# Patient Record
Sex: Female | Born: 1950 | Race: White | Hispanic: No | Smoking: Former smoker
Health system: Southern US, Community
[De-identification: ages and names within clinical notes are randomized; demographics above are authoritative.]

## PROBLEM LIST (undated history)

## (undated) DIAGNOSIS — I10 Essential (primary) hypertension: Secondary | ICD-10-CM

## (undated) DIAGNOSIS — I639 Cerebral infarction, unspecified: Secondary | ICD-10-CM

## (undated) DIAGNOSIS — K219 Gastro-esophageal reflux disease without esophagitis: Secondary | ICD-10-CM

## (undated) DIAGNOSIS — Z87891 Personal history of nicotine dependence: Secondary | ICD-10-CM

## (undated) DIAGNOSIS — K635 Polyp of colon: Secondary | ICD-10-CM

## (undated) DIAGNOSIS — Z8679 Personal history of other diseases of the circulatory system: Secondary | ICD-10-CM

## (undated) DIAGNOSIS — R7302 Impaired glucose tolerance (oral): Secondary | ICD-10-CM

## (undated) DIAGNOSIS — Z86010 Personal history of colonic polyps: Secondary | ICD-10-CM

## (undated) DIAGNOSIS — R002 Palpitations: Secondary | ICD-10-CM

## (undated) DIAGNOSIS — Z0000 Encounter for general adult medical examination without abnormal findings: Secondary | ICD-10-CM

## (undated) DIAGNOSIS — Z85828 Personal history of other malignant neoplasm of skin: Secondary | ICD-10-CM

## (undated) DIAGNOSIS — E669 Obesity, unspecified: Secondary | ICD-10-CM

## (undated) DIAGNOSIS — N390 Urinary tract infection, site not specified: Secondary | ICD-10-CM

## (undated) DIAGNOSIS — F329 Major depressive disorder, single episode, unspecified: Secondary | ICD-10-CM

## (undated) DIAGNOSIS — F32A Depression, unspecified: Secondary | ICD-10-CM

## (undated) DIAGNOSIS — E785 Hyperlipidemia, unspecified: Secondary | ICD-10-CM

## (undated) DIAGNOSIS — G4730 Sleep apnea, unspecified: Secondary | ICD-10-CM

## (undated) DIAGNOSIS — D649 Anemia, unspecified: Secondary | ICD-10-CM

## (undated) DIAGNOSIS — H8110 Benign paroxysmal vertigo, unspecified ear: Secondary | ICD-10-CM

## (undated) DIAGNOSIS — I499 Cardiac arrhythmia, unspecified: Secondary | ICD-10-CM

## (undated) DIAGNOSIS — K922 Gastrointestinal hemorrhage, unspecified: Secondary | ICD-10-CM

## (undated) DIAGNOSIS — M1990 Unspecified osteoarthritis, unspecified site: Secondary | ICD-10-CM

## (undated) DIAGNOSIS — K5790 Diverticulosis of intestine, part unspecified, without perforation or abscess without bleeding: Secondary | ICD-10-CM

## (undated) HISTORY — DX: Impaired glucose tolerance (oral): R73.02

## (undated) HISTORY — PX: CAROTID STENT: SHX1301

## (undated) HISTORY — DX: Personal history of other malignant neoplasm of skin: Z85.828

## (undated) HISTORY — DX: Personal history of colonic polyps: Z86.010

## (undated) HISTORY — DX: Polyp of colon: K63.5

## (undated) HISTORY — DX: Gastro-esophageal reflux disease without esophagitis: K21.9

## (undated) HISTORY — DX: Encounter for general adult medical examination without abnormal findings: Z00.00

## (undated) HISTORY — DX: Cerebral infarction, unspecified: I63.9

## (undated) HISTORY — PX: WISDOM TOOTH EXTRACTION: SHX21

## (undated) HISTORY — DX: Personal history of nicotine dependence: Z87.891

## (undated) HISTORY — DX: Major depressive disorder, single episode, unspecified: F32.9

## (undated) HISTORY — DX: Personal history of other diseases of the circulatory system: Z86.79

## (undated) HISTORY — PX: CERVICAL CONE BIOPSY: SUR198

## (undated) HISTORY — DX: Urinary tract infection, site not specified: N39.0

## (undated) HISTORY — DX: Unspecified osteoarthritis, unspecified site: M19.90

## (undated) HISTORY — DX: Diverticulosis of intestine, part unspecified, without perforation or abscess without bleeding: K57.90

## (undated) HISTORY — DX: Essential (primary) hypertension: I10

## (undated) HISTORY — DX: Palpitations: R00.2

## (undated) HISTORY — DX: Sleep apnea, unspecified: G47.30

## (undated) HISTORY — DX: Hyperlipidemia, unspecified: E78.5

## (undated) HISTORY — DX: Gastrointestinal hemorrhage, unspecified: K92.2

## (undated) HISTORY — PX: JOINT REPLACEMENT: SHX530

## (undated) HISTORY — DX: Depression, unspecified: F32.A

## (undated) HISTORY — PX: TUBAL LIGATION: SHX77

## (undated) HISTORY — DX: Benign paroxysmal vertigo, unspecified ear: H81.10

## (undated) HISTORY — DX: Obesity, unspecified: E66.9

---
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
## 1998-01-11 ENCOUNTER — Other Ambulatory Visit: Admission: RE | Admit: 1998-01-11 | Discharge: 1998-01-11 | Payer: Self-pay | Admitting: *Deleted

## 1999-04-11 ENCOUNTER — Other Ambulatory Visit: Admission: RE | Admit: 1999-04-11 | Discharge: 1999-04-11 | Payer: Self-pay | Admitting: *Deleted

## 2000-05-07 ENCOUNTER — Other Ambulatory Visit: Admission: RE | Admit: 2000-05-07 | Discharge: 2000-05-07 | Payer: Self-pay | Admitting: *Deleted

## 2001-08-05 ENCOUNTER — Other Ambulatory Visit: Admission: RE | Admit: 2001-08-05 | Discharge: 2001-08-05 | Payer: Self-pay | Admitting: *Deleted

## 2001-08-29 ENCOUNTER — Encounter: Admission: RE | Admit: 2001-08-29 | Discharge: 2001-08-29 | Payer: Self-pay | Admitting: Internal Medicine

## 2001-08-29 ENCOUNTER — Encounter: Payer: Self-pay | Admitting: Internal Medicine

## 2002-07-28 ENCOUNTER — Other Ambulatory Visit: Admission: RE | Admit: 2002-07-28 | Discharge: 2002-07-28 | Payer: Self-pay | Admitting: *Deleted

## 2002-10-10 ENCOUNTER — Ambulatory Visit (HOSPITAL_COMMUNITY): Admission: RE | Admit: 2002-10-10 | Discharge: 2002-10-10 | Payer: Self-pay | Admitting: Internal Medicine

## 2002-10-10 ENCOUNTER — Encounter: Payer: Self-pay | Admitting: Internal Medicine

## 2002-10-13 ENCOUNTER — Encounter: Payer: Self-pay | Admitting: Internal Medicine

## 2002-10-13 ENCOUNTER — Ambulatory Visit (HOSPITAL_COMMUNITY): Admission: RE | Admit: 2002-10-13 | Discharge: 2002-10-13 | Payer: Self-pay | Admitting: Internal Medicine

## 2002-10-24 ENCOUNTER — Encounter: Admission: RE | Admit: 2002-10-24 | Discharge: 2002-10-24 | Payer: Self-pay | Admitting: Infectious Diseases

## 2002-10-30 ENCOUNTER — Encounter: Admission: RE | Admit: 2002-10-30 | Discharge: 2002-10-30 | Payer: Self-pay | Admitting: Infectious Diseases

## 2002-12-13 ENCOUNTER — Encounter: Admission: RE | Admit: 2002-12-13 | Discharge: 2002-12-13 | Payer: Self-pay | Admitting: Infectious Diseases

## 2002-12-29 ENCOUNTER — Encounter: Admission: RE | Admit: 2002-12-29 | Discharge: 2002-12-29 | Payer: Self-pay | Admitting: Infectious Diseases

## 2002-12-29 ENCOUNTER — Encounter: Payer: Self-pay | Admitting: Infectious Diseases

## 2003-01-23 ENCOUNTER — Encounter: Admission: RE | Admit: 2003-01-23 | Discharge: 2003-01-23 | Payer: Self-pay | Admitting: Infectious Diseases

## 2003-08-10 ENCOUNTER — Other Ambulatory Visit: Admission: RE | Admit: 2003-08-10 | Discharge: 2003-08-10 | Payer: Self-pay | Admitting: *Deleted

## 2003-10-19 ENCOUNTER — Encounter: Admission: RE | Admit: 2003-10-19 | Discharge: 2003-10-19 | Payer: Self-pay | Admitting: Family Medicine

## 2007-03-15 ENCOUNTER — Ambulatory Visit: Payer: Self-pay | Admitting: Internal Medicine

## 2007-03-15 ENCOUNTER — Ambulatory Visit: Payer: Self-pay | Admitting: Cardiology

## 2007-03-15 ENCOUNTER — Observation Stay (HOSPITAL_COMMUNITY): Admission: EM | Admit: 2007-03-15 | Discharge: 2007-03-17 | Payer: Self-pay | Admitting: Emergency Medicine

## 2007-03-16 ENCOUNTER — Encounter (INDEPENDENT_AMBULATORY_CARE_PROVIDER_SITE_OTHER): Payer: Self-pay | Admitting: Internal Medicine

## 2007-12-20 ENCOUNTER — Ambulatory Visit (HOSPITAL_BASED_OUTPATIENT_CLINIC_OR_DEPARTMENT_OTHER): Admission: RE | Admit: 2007-12-20 | Discharge: 2007-12-20 | Payer: Self-pay | Admitting: *Deleted

## 2007-12-20 ENCOUNTER — Ambulatory Visit: Payer: Self-pay | Admitting: *Deleted

## 2007-12-20 DIAGNOSIS — Z85828 Personal history of other malignant neoplasm of skin: Secondary | ICD-10-CM | POA: Insufficient documentation

## 2007-12-20 DIAGNOSIS — Z87898 Personal history of other specified conditions: Secondary | ICD-10-CM | POA: Insufficient documentation

## 2007-12-20 DIAGNOSIS — M129 Arthropathy, unspecified: Secondary | ICD-10-CM | POA: Insufficient documentation

## 2007-12-20 DIAGNOSIS — IMO0002 Reserved for concepts with insufficient information to code with codable children: Secondary | ICD-10-CM | POA: Insufficient documentation

## 2007-12-20 DIAGNOSIS — Z8679 Personal history of other diseases of the circulatory system: Secondary | ICD-10-CM | POA: Insufficient documentation

## 2007-12-20 DIAGNOSIS — I10 Essential (primary) hypertension: Secondary | ICD-10-CM | POA: Insufficient documentation

## 2007-12-20 DIAGNOSIS — E785 Hyperlipidemia, unspecified: Secondary | ICD-10-CM | POA: Insufficient documentation

## 2007-12-20 LAB — CONVERTED CEMR LAB
ALT: 22 units/L (ref 0–35)
AST: 23 units/L (ref 0–37)
Basophils Absolute: 0 10*3/uL (ref 0.0–0.1)
Basophils Relative: 0.8 % (ref 0.0–1.0)
Calcium: 10.2 mg/dL (ref 8.4–10.5)
Chloride: 104 meq/L (ref 96–112)
Cholesterol: 224 mg/dL (ref 0–200)
Creatinine, Ser: 0.8 mg/dL (ref 0.4–1.2)
Eosinophils Absolute: 0.1 10*3/uL (ref 0.0–0.7)
HDL: 47 mg/dL (ref >39.0)
Lymphocytes Relative: 40.4 % (ref 12.0–46.0)
MCHC: 34.3 g/dL (ref 30.0–36.0)
MCV: 92 fL (ref 78.0–100.0)
Neutrophils Relative %: 49 % (ref 43.0–77.0)
RBC: 4.56 M/uL (ref 3.87–5.11)
RDW: 12.4 % (ref 11.5–14.6)
Sodium: 143 meq/L (ref 135–145)
TSH: 1.3 microintl units/mL (ref 0.35–5.50)
Total Bilirubin: 0.9 mg/dL (ref 0.3–1.2)
Total Protein: 7.5 g/dL (ref 6.0–8.3)
Triglycerides: 161 mg/dL — ABNORMAL HIGH (ref 0–149)
VLDL: 32 mg/dL (ref 0–40)

## 2007-12-22 ENCOUNTER — Encounter (INDEPENDENT_AMBULATORY_CARE_PROVIDER_SITE_OTHER): Payer: Self-pay | Admitting: *Deleted

## 2007-12-22 DIAGNOSIS — E739 Lactose intolerance, unspecified: Secondary | ICD-10-CM | POA: Insufficient documentation

## 2008-02-02 ENCOUNTER — Ambulatory Visit: Payer: Self-pay | Admitting: *Deleted

## 2008-02-03 LAB — CONVERTED CEMR LAB
ALT: 21 units/L (ref 0–35)
Albumin: 4 g/dL (ref 3.5–5.2)
Basophils Absolute: 0 10*3/uL (ref 0.0–0.1)
CO2: 30 meq/L (ref 19–32)
Calcium: 9.5 mg/dL (ref 8.4–10.5)
Chloride: 105 meq/L (ref 96–112)
Cholesterol: 230 mg/dL (ref 0–200)
GFR calc Af Amer: 110 mL/min
HDL: 51.7 mg/dL (ref >39.0)
Lymphocytes Relative: 38.2 % (ref 12.0–46.0)
MCHC: 35.1 g/dL (ref 30.0–36.0)
Neutrophils Relative %: 51.7 % (ref 43.0–77.0)
Potassium: 4 meq/L (ref 3.5–5.1)
RDW: 12.5 % (ref 11.5–14.6)
Sodium: 141 meq/L (ref 135–145)
TSH: 1.49 microintl units/mL (ref 0.35–5.50)
Total Bilirubin: 0.8 mg/dL (ref 0.3–1.2)
Total CHOL/HDL Ratio: 4.4
Total Protein: 7 g/dL (ref 6.0–8.3)
VLDL: 28 mg/dL (ref 0–40)

## 2008-02-17 ENCOUNTER — Ambulatory Visit: Payer: Self-pay | Admitting: *Deleted

## 2008-03-13 ENCOUNTER — Ambulatory Visit: Payer: Self-pay | Admitting: *Deleted

## 2008-04-21 ENCOUNTER — Emergency Department (HOSPITAL_BASED_OUTPATIENT_CLINIC_OR_DEPARTMENT_OTHER): Admission: EM | Admit: 2008-04-21 | Discharge: 2008-04-21 | Payer: Self-pay | Admitting: Emergency Medicine

## 2008-07-17 ENCOUNTER — Ambulatory Visit: Payer: Self-pay | Admitting: *Deleted

## 2008-07-17 LAB — CONVERTED CEMR LAB
ALT: 22 units/L (ref 0–35)
CO2: 30 meq/L (ref 19–32)
Calcium: 9.7 mg/dL (ref 8.4–10.5)
Chloride: 100 meq/L (ref 96–112)
Creatinine, Ser: 0.8 mg/dL (ref 0.4–1.2)
GFR calc Af Amer: 94 mL/min

## 2008-07-18 DIAGNOSIS — F329 Major depressive disorder, single episode, unspecified: Secondary | ICD-10-CM | POA: Insufficient documentation

## 2008-10-10 ENCOUNTER — Ambulatory Visit: Payer: Self-pay | Admitting: Internal Medicine

## 2008-10-10 LAB — CONVERTED CEMR LAB
AST: 25 units/L (ref 0–37)
CRP, High Sensitivity: 8 — ABNORMAL HIGH (ref 0.00–5.00)
HDL: 51.6 mg/dL (ref >39.00)
LDL Goal: 130 mg/dL
TSH: 1.35 microintl units/mL (ref 0.35–5.50)
Triglycerides: 125 mg/dL (ref 0.0–149.0)

## 2008-10-16 ENCOUNTER — Telehealth: Payer: Self-pay | Admitting: Internal Medicine

## 2008-11-01 ENCOUNTER — Inpatient Hospital Stay (HOSPITAL_COMMUNITY): Admission: RE | Admit: 2008-11-01 | Discharge: 2008-11-05 | Payer: Self-pay | Admitting: Orthopedic Surgery

## 2008-11-01 HISTORY — PX: KNEE SURGERY: SHX244

## 2008-11-29 ENCOUNTER — Ambulatory Visit: Payer: Self-pay | Admitting: Internal Medicine

## 2009-01-22 ENCOUNTER — Telehealth: Payer: Self-pay | Admitting: Internal Medicine

## 2009-02-05 ENCOUNTER — Encounter: Payer: Self-pay | Admitting: Family Medicine

## 2009-02-05 ENCOUNTER — Ambulatory Visit: Payer: Self-pay | Admitting: Family Medicine

## 2009-02-05 ENCOUNTER — Other Ambulatory Visit: Admission: RE | Admit: 2009-02-05 | Discharge: 2009-02-05 | Payer: Self-pay | Admitting: *Deleted

## 2009-02-05 ENCOUNTER — Ambulatory Visit (HOSPITAL_BASED_OUTPATIENT_CLINIC_OR_DEPARTMENT_OTHER): Admission: RE | Admit: 2009-02-05 | Discharge: 2009-02-05 | Payer: Self-pay | Admitting: Internal Medicine

## 2009-02-05 DIAGNOSIS — Z780 Asymptomatic menopausal state: Secondary | ICD-10-CM | POA: Insufficient documentation

## 2009-04-16 ENCOUNTER — Telehealth: Payer: Self-pay | Admitting: Internal Medicine

## 2009-04-23 ENCOUNTER — Telehealth: Payer: Self-pay | Admitting: Internal Medicine

## 2009-09-13 ENCOUNTER — Telehealth: Payer: Self-pay | Admitting: Internal Medicine

## 2009-09-19 ENCOUNTER — Ambulatory Visit: Payer: Self-pay | Admitting: Internal Medicine

## 2009-09-19 LAB — CONVERTED CEMR LAB
BUN: 21 mg/dL (ref 6–23)
CO2: 23 meq/L (ref 19–32)
Chloride: 101 meq/L (ref 96–112)
Creatinine, Ser: 0.8 mg/dL (ref 0.40–1.20)
Glucose, Bld: 104 mg/dL — ABNORMAL HIGH (ref 70–99)
LDL Cholesterol: 119 mg/dL — ABNORMAL HIGH (ref 0–99)

## 2009-09-20 ENCOUNTER — Encounter: Payer: Self-pay | Admitting: Internal Medicine

## 2010-06-27 ENCOUNTER — Ambulatory Visit
Admission: RE | Admit: 2010-06-27 | Discharge: 2010-06-27 | Payer: Self-pay | Source: Home / Self Care | Attending: Internal Medicine | Admitting: Internal Medicine

## 2010-06-27 DIAGNOSIS — J310 Chronic rhinitis: Secondary | ICD-10-CM | POA: Insufficient documentation

## 2010-07-10 NOTE — Assessment & Plan Note (Signed)
Summary: fu meds self pay does not have ins/dt   Vital Signs:  Patient profile:   60 year old female Menstrual status:  postmenopausal Height:      60 inches Weight:      187 pounds BMI:     36.65 O2 Sat:      95 % on 0.75 L/min Temp:     98.0 degrees F oral Pulse rate:   63 / minute Pulse rhythm:   regular Resp:     16 per minute BP sitting:   110 / 80  (left arm) Cuff size:   large  Vitals Entered By: Glendell Docker CMA (September 19, 2009 11:14 AM)  O2 Flow:  0.75 L/min CC: Rm 2- [place] Refill Comments fasting for labs, requestin 90 with refills- paper rx to take with her   Primary Care Provider:  Dondra Spry DO  CC:  Rm 2- Meidcation Refill.  History of Present Illness:  Hypertension Follow-Up      This is a 60 year old woman who presents for Hypertension follow-up.  The patient denies lightheadedness and headaches.  The patient denies the following associated symptoms: chest pain.  Compliance with medications (by patient report) has been near 100%.  The patient reports that dietary compliance has been fair.    hyperlipidemia - stable  Allergies: 1)  ! 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   Neck:  no masses.  no carotid bruits.   Lungs:  normal respiratory effort, normal breath sounds, no crackles, and no wheezes.   Heart:  normal rate, regular rhythm, and no gallop.   Extremities:  No lower extremity  edema    Impression & Recommendations:  Problem # 1:  ESSENTIAL HYPERTENSION, BENIGN (ICD-401.1) stable.  Maintain current medication regimen.  Her updated medication list for this problem includes:    Hydrochlorothiazide 12.5 Mg Caps (Hydrochlorothiazide) .Marland Kitchen... Take 1 capsule by mouth once a day    Metoprolol Tartrate 50 Mg Tabs (Metoprolol tartrate) .Marland Kitchen... 1 tab by mouth bid  Orders: T-Basic Metabolic Panel [phone])  BP today: 110/80 Prior BP: 124/82 (02/05/2009)  Prior 10 Yr Risk Heart Disease: Not enough information (10/10/2008)  Labs Reviewed: K+: 3.8 (07/17/2008) Creat: : 0.8 (07/17/2008)   Chol: 204 (10/10/2008)   HDL: 51.60 (10/10/2008)   LDL: DEL (02/02/2008)   TG: 125.0 (10/10/2008)  Problem # 2:  HYPERLIPIDEMIA (ICD-272.4) stable.  monitor labs  Her updated medication list for this problem includes:    Pravastatin Sodium 40 Mg Tabs (Pravastatin sodium) ..... One by mouth qhs  Orders: T-Lipid Profile [id number])  Labs Reviewed: SGOT: 25 (10/10/2008)   SGPT: 25 (10/10/2008)  Lipid Goals: Chol Goal: 200 (10/10/2008)   HDL Goal: 40 (10/10/2008)   LDL Goal: 130 (10/10/2008)   TG Goal: 150 (10/10/2008)  Prior 10 Yr Risk Heart Disease: Not enough information (10/10/2008)   HDL:51.60 (10/10/2008), 51.7 (02/02/2008)  LDL:DEL (02/02/2008), DEL (12/20/2007)  Chol:204 (10/10/2008), 230 (02/02/2008)  Trig:125.0 (10/10/2008), 139 (02/02/2008)  Complete Medication List: 1)  Bayer Low Strength 81 Mg Tbec (Aspirin) .Marland KitchenMarland KitchenMarland Kitchen  Once daily 2)  Hydrochlorothiazide 12.5 Mg Caps (Hydrochlorothiazide) .... Take 1 capsule by mouth once a day 3)  Fluoxetine Hcl 20 Mg Caps (Fluoxetine hcl) .... Take 1 tablet by mouth once a day 4)  Metoprolol Tartrate 50 Mg Tabs (Metoprolol tartrate) .Marland Kitchen.. 1 tab by mouth bid 5)  Multi For Her 50+ Tabs (Multiple vitamins-minerals) .... Take 1 tablet by mouth once a day 6)  Pravastatin Sodium 40 Mg Tabs (Pravastatin sodium) .... One by mouth  qhs  Patient Instructions: 1)  [email or web address]-[email or web address]/ 2)  Please schedule a follow-up appointment in 6 months. Prescriptions: METOPROLOL TARTRATE 50 MG TABS (METOPROLOL TARTRATE) 1 tab by mouth bid  #180 x 1   Entered and Authorized by:   D. Thomos Lemons DO   Signed by:   D. Thomos Lemons DO on 09/19/2009   Method used:   Electronically to        [facility] # [phone]* (retail)       [address]       [place], [place]  [address]       Ph: [phone]       Fax: [phone]   RxID:   [id number] HYDROCHLOROTHIAZIDE 12.5 MG CAPS (HYDROCHLOROTHIAZIDE) Take 1 capsule by mouth once a day  #90 x 1   Entered and Authorized by:   D. Thomos Lemons DO   Signed by:   D. Thomos Lemons DO on 09/19/2009   Method used:   Electronically to        [facility] # [phone]* (retail)       [address]       [place], [place]  [address]       Ph: [phone]       Fax: [phone]   RxID:   [id number] FLUOXETINE HCL 20 MG  CAPS (FLUOXETINE HCL) Take 1 tablet by mouth once a day  #90 x 1   Entered and Authorized by:   D. Thomos Lemons DO   Signed by:   D. Thomos Lemons DO on 09/19/2009   Method used:   Electronically to        [facility] # [phone]* (retail)       [address]       [place], [place]  [address]       Ph: [phone]       Fax: [phone]   RxID:   [id number] PRAVASTATIN SODIUM 40 MG TABS (PRAVASTATIN SODIUM) one by mouth qhs  #90 x 3   Entered and Authorized by:   D. Thomos Lemons DO   Signed by:   D. Thomos Lemons DO on 09/19/2009   Method used:   Electronically to        [facility] # [phone]* (retail)       [address]       [place], [place]  [address]       Ph: [phone]       Fax: [phone]   RxID:   [id number]   Current Allergies (reviewed today): ! VICODIN (HYDROCODONE-ACETAMINOPHEN)     Immunization History:  Pneumovax Immunization  History:    Pneumovax:  historical (10/16/2008)

## 2010-07-10 NOTE — Letter (Signed)
   [facility] [address] [place], [place]  [address]  [place] Phone: [phone]      September 20, 2009   [employer] [facility] [address] [place], [place] [address]  RE:  LAB RESULTS  Dear  Ms. Belsky,  The following is an interpretation of your most recent lab tests.  Please take note of any instructions provided or changes to medications that have resulted from your lab work.  ELECTROLYTES:  Good - no changes needed  KIDNEY FUNCTION TESTS:  Good - no changes needed  LIPID PANEL:  Fair - review at your next visit Triglyceride: 215   Cholesterol: 215   LDL: 119   HDL: 53   Chol/HDL%:  4.1 Ratio    Please follow a low saturated fat diet and take your cholesterol medication regularly.       Sincerely Yours,    Dr. Thomos Lemons

## 2010-07-10 NOTE — Progress Notes (Signed)
Summary: Medication Refills  Phone Note Refill Request Call back at Home Phone [phone]   Refills Requested: Medication #1:  HYDROCHLOROTHIAZIDE 12.5 MG CAPS Take 1 capsule by mouth once a day   Dosage confirmed as above?Dosage Confirmed   Brand Name Necessary? No   Supply Requested: 3 months  Medication #2:  FLUOXETINE HCL 20 MG  CAPS Take 1 tablet by mouth once a day   Dosage confirmed as above?Dosage Confirmed   Brand Name Necessary? No   Supply Requested: 3 months  Medication #3:  METOPROLOL TARTRATE 50 MG TABS 1 tab by mouth bid   Dosage confirmed as above?Dosage Confirmed   Brand Name Necessary? No   Supply Requested: 3 months  Medication #4:  LOVASTATIN 20 MG TABS 2 tabs by mouth once daily.   Dosage confirmed as above?Dosage Confirmed   Brand Name Necessary? No   Supply Requested: 3 months the drug store only gave her a 15 days upply for metoprolol.  if she gets a 90 day rx on each each time it only costs 10 per rx if tshe gets a 30 day its $4 She saves $2 by getting 90 days.  she no longer has insurance.  [facility] [place]     Method Requested: Electronic Next Appointment Scheduled: none Initial call taken by: Roselle Locus,  September 13, 2009 11:48 AM  Follow-up for Phone Call        call  returned to patient at [phone], she was informed she is due an office visit,refills could not be authorized for a 90 day supply with refills, unless approved by Dr Artist Pais. She states she does not have any insurance right now, however if needed she would find a wat to get in here. Patient was made aware that Dr Artist Pais was out of the office until Monday, and she would get a cll back then regarding the status. Patient verbalized understanding and she is okay to wait until then Follow-up by: Glendell Docker CMA,  September 13, 2009 2:53 PM

## 2010-07-16 NOTE — Assessment & Plan Note (Signed)
Summary: Sinus infection/hea   Vital Signs:  Patient profile:   60 year old female Menstrual status:  postmenopausal Height:      60 inches Weight:      186.25 pounds BMI:     36.51 O2 Sat:      94 % on Room air Temp:     97.5 degrees F oral Pulse rate:   84 / minute Resp:     22 per minute BP sitting:   112 / 82  (right arm) Cuff size:   large  Vitals Entered By: Glendell Docker CMA (June 27, 2010 8:04 AM)  O2 Flow:  Room air CC: Nasal Drip Is Patient Diabetic? No Pain Assessment Patient in pain? no        Primary Care Provider:  Dondra Spry DO  CC:  Nasal Drip.  History of Present Illness: post nasal gtt x 1 month keeps her up at night throat feels tight tried netti pot tried raising head of bed  hall cough drops   no facial sensation  has been using afrin on and off for years  Preventive Screening-Counseling & Management  Alcohol-Tobacco     Smoking Status: quit  Allergies: 1)  ! Vicodin (Hydrocodone-Acetaminophen)  Past History:  Past Medical History: CARCINOMA, BASAL CELL, HX OF (ICD-V10.83) ARRHYTHMIA, HX OF (ICD-V12.50) ARTHRITIS (ICD-716.90)  GLAUCOMA, HX OF (ICD-V13.8) HYPERLIPIDEMIA (ICD-272.4)  ESSENTIAL HYPERTENSION, BENIGN (ICD-401.1) glucose intolerance   Depression  Physical Exam  General:  alert, well-developed, and well-nourished.   Ears:  R ear normal and L ear normal.   Nose:  mucosal erythema, mucosal edema, and airflow obstruction.   Mouth:  pharynx pink and moist.  mild post nasal gtt   Impression & Recommendations:  Problem # 1:  RHINITIS (ICD-472.0) stop afrin use only astelin and flonase as directed Patient advised to call office if symptoms persist or worsen.  Problem # 2:  ESSENTIAL HYPERTENSION, BENIGN (ICD-401.1) Assessment: Unchanged  Her updated medication list for this problem includes:    Hydrochlorothiazide 12.5 Mg Caps (Hydrochlorothiazide) .Marland Kitchen... Take 1 capsule by mouth once a day  Metoprolol Tartrate 50 Mg Tabs (Metoprolol tartrate) .Marland Kitchen... 1 tab by mouth bid  BP today: 112/82 Prior BP: 110/80 (09/19/2009)  Prior 10 Yr Risk Heart Disease: Not enough information (10/10/2008)  Labs Reviewed: K+: 4.3 (09/19/2009) Creat: : 0.80 (09/19/2009)   Chol: 215 (09/19/2009)   HDL: 53 (09/19/2009)   LDL: 119 (09/19/2009)   TG: 215 (09/19/2009)  Problem # 3:  HYPERLIPIDEMIA (ICD-272.4)  Her updated medication list for this problem includes:    Pravastatin Sodium 40 Mg Tabs (Pravastatin sodium) ..... One by mouth qhs  Labs Reviewed: SGOT: 25 (10/10/2008)   SGPT: 25 (10/10/2008)  Lipid Goals: Chol Goal: 200 (10/10/2008)   HDL Goal: 40 (10/10/2008)   LDL Goal: 130 (10/10/2008)   TG Goal: 150 (10/10/2008)  Prior 10 Yr Risk Heart Disease: Not enough information (10/10/2008)   HDL:53 (09/19/2009), 51.60 (10/10/2008)  LDL:119 (09/19/2009), DEL (02/02/2008)  Chol:215 (09/19/2009), 204 (10/10/2008)  Trig:215 (09/19/2009), 125.0 (10/10/2008)  Problem # 4:  DEPRESSION (ICD-311) Assessment: Unchanged  Her updated medication list for this problem includes:    Fluoxetine Hcl 20 Mg Caps (Fluoxetine hcl) .Marland Kitchen... Take 1 tablet by mouth once a day  Complete Medication List: 1)  Bayer Low Strength 81 Mg Tbec (Aspirin) .... Once daily 2)  Hydrochlorothiazide 12.5 Mg Caps (Hydrochlorothiazide) .... Take 1 capsule by mouth once a day 3)  Fluoxetine Hcl 20 Mg Caps (Fluoxetine hcl) .Marland KitchenMarland KitchenMarland Kitchen  Take 1 tablet by mouth once a day 4)  Metoprolol Tartrate 50 Mg Tabs (Metoprolol tartrate) .Marland Kitchen.. 1 tab by mouth bid 5)  Multi For Her 50+ Tabs (Multiple vitamins-minerals) .... Take 1 tablet by mouth once a day 6)  Pravastatin Sodium 40 Mg Tabs (Pravastatin sodium) .... One by mouth qhs 7)  Fluticasone Propionate 50 Mcg/act Susp (Fluticasone propionate) .... 2 sprays each nostril once daily 8)  Azelastine Hcl 137 Mcg/spray Soln (Azelastine hcl) .... 2 sprays each nostril two times a day  Patient Instructions: 1)   Call our office if your symptoms do not  improve or gets worse. 2)  Please schedule a follow-up appointment in 6 months. 3)  Stop using afrin Prescriptions: PRAVASTATIN SODIUM 40 MG TABS (PRAVASTATIN SODIUM) one by mouth qhs  #90 x 1   Entered and Authorized by:   D. Thomos Lemons DO   Signed by:   D. Thomos Lemons DO on 06/27/2010   Method used:   Print then Give to Patient   RxID:   [id number] METOPROLOL TARTRATE 50 MG TABS (METOPROLOL TARTRATE) 1 tab by mouth bid  #180 x 1   Entered and Authorized by:   D. Thomos Lemons DO   Signed by:   D. Thomos Lemons DO on 06/27/2010   Method used:   Print then Give to Patient   RxID:   [id number] FLUOXETINE HCL 20 MG  CAPS (FLUOXETINE HCL) Take 1 tablet by mouth once a day  #90 x 1   Entered and Authorized by:   D. Thomos Lemons DO   Signed by:   D. Thomos Lemons DO on 06/27/2010   Method used:   Print then Give to Patient   RxID:   [id number] HYDROCHLOROTHIAZIDE 12.5 MG CAPS (HYDROCHLOROTHIAZIDE) Take 1 capsule by mouth once a day  #90 Each x 1   Entered and Authorized by:   D. Thomos Lemons DO   Signed by:   D. Thomos Lemons DO on 06/27/2010   Method used:   Print then Give to Patient   RxID:   [id number] AZELASTINE HCL 137 MCG/SPRAY SOLN (AZELASTINE HCL) 2 sprays each nostril two times a day  #1 x 2   Entered and Authorized by:   D. Thomos Lemons DO   Signed by:   D. Thomos Lemons DO on 06/27/2010   Method used:   Print then Give to Patient   RxID:   [id number] FLUTICASONE PROPIONATE 50 MCG/ACT SUSP (FLUTICASONE PROPIONATE) 2 sprays each nostril once daily  #1 x 2   Entered and Authorized by:   D. Thomos Lemons DO   Signed by:   D. Thomos Lemons DO on 06/27/2010   Method used:   Print then Give to Patient   RxID:   [id number]    Orders Added: 1)  Est. Patient Level II [29937]       Current Allergies (reviewed today): ! VICODIN (HYDROCODONE-ACETAMINOPHEN)

## 2010-09-16 LAB — COMPREHENSIVE METABOLIC PANEL
ALT: 20 U/L (ref 0–35)
Albumin: 4.3 g/dL (ref 3.5–5.2)
Alkaline Phosphatase: 65 U/L (ref 39–117)
BUN: 15 mg/dL (ref 6–23)
Chloride: 101 mEq/L (ref 96–112)
Glucose, Bld: 104 mg/dL — ABNORMAL HIGH (ref 70–99)
Potassium: 4.1 mEq/L (ref 3.5–5.1)
Total Bilirubin: 0.3 mg/dL (ref 0.3–1.2)

## 2010-09-16 LAB — BASIC METABOLIC PANEL
BUN: 10 mg/dL (ref 6–23)
BUN: 11 mg/dL (ref 6–23)
BUN: 8 mg/dL (ref 6–23)
CO2: 30 mEq/L (ref 19–32)
CO2: 30 mEq/L (ref 19–32)
CO2: 32 mEq/L (ref 19–32)
Calcium: 8.7 mg/dL (ref 8.4–10.5)
Calcium: 9 mg/dL (ref 8.4–10.5)
Calcium: 9 mg/dL (ref 8.4–10.5)
Creatinine, Ser: 0.72 mg/dL (ref 0.4–1.2)
Creatinine, Ser: 0.73 mg/dL (ref 0.4–1.2)
Creatinine, Ser: 0.78 mg/dL (ref 0.4–1.2)
GFR calc Af Amer: >60 mL/min (ref >60)
GFR calc Af Amer: >60 mL/min (ref >60)
GFR calc non Af Amer: >60 mL/min (ref >60)
GFR calc non Af Amer: >60 mL/min (ref >60)
GFR calc non Af Amer: >60 mL/min (ref >60)
Glucose, Bld: 121 mg/dL — ABNORMAL HIGH (ref 70–99)
Glucose, Bld: 136 mg/dL — ABNORMAL HIGH (ref 70–99)
Glucose, Bld: 157 mg/dL — ABNORMAL HIGH (ref 70–99)
Potassium: 3.3 mEq/L — ABNORMAL LOW (ref 3.5–5.1)
Sodium: 138 mEq/L (ref 135–145)

## 2010-09-16 LAB — CBC
HCT: 29.2 % — ABNORMAL LOW (ref 36.0–46.0)
HCT: 39.5 % (ref 36.0–46.0)
Hemoglobin: 14 g/dL (ref 12.0–15.0)
MCHC: 34.2 g/dL (ref 30.0–36.0)
MCHC: 34.9 g/dL (ref 30.0–36.0)
MCV: 94.1 fL (ref 78.0–100.0)
Platelets: 153 10*3/uL (ref 150–400)
Platelets: 172 10*3/uL (ref 150–400)
Platelets: 187 10*3/uL (ref 150–400)
RBC: 2.86 MIL/uL — ABNORMAL LOW (ref 3.87–5.11)
RBC: 4.32 MIL/uL (ref 3.87–5.11)
RDW: 13 % (ref 11.5–15.5)
RDW: 13 % (ref 11.5–15.5)
RDW: 13.1 % (ref 11.5–15.5)
WBC: 6.9 10*3/uL (ref 4.0–10.5)
WBC: 9.4 10*3/uL (ref 4.0–10.5)

## 2010-09-16 LAB — PROTIME-INR
INR: 1 (ref 0.00–1.49)
INR: 1.8 — ABNORMAL HIGH (ref 0.00–1.49)
INR: 2.1 — ABNORMAL HIGH (ref 0.00–1.49)
Prothrombin Time: 13.7 seconds (ref 11.6–15.2)
Prothrombin Time: 21.6 seconds — ABNORMAL HIGH (ref 11.6–15.2)
Prothrombin Time: 23.3 seconds — ABNORMAL HIGH (ref 11.6–15.2)

## 2010-10-21 NOTE — Op Note (Signed)
NAMEKYNNEDY, CARRENO                ACCOUNT NO.:  [id number]   MEDICAL RECORD NO.:  [id number]          PATIENT TYPE:  INP   LOCATION:  5039                         FACILITY:  [facility]   PHYSICIAN:  Nadara Mustard, MD     DATE OF BIRTH:  12-14-1950   DATE OF PROCEDURE:  11/01/2008  DATE OF DISCHARGE:                               OPERATIVE REPORT   PREOPERATIVE DIAGNOSIS:  Osteoarthritis, left knee.   POSTOPERATIVE DIAGNOSIS:  Osteoarthritis, left knee.   PROCEDURE:  Left total knee arthroplasty with Katrinka Blazing and nephew  components, a #4 femur, #3 tibia, 13-mm posterior stabilized  polyethylene with a 32-mm patella.   SURGEON:  Nadara Mustard, MD   ANESTHESIA:  Wende Neighbors, PA-C   ANESTHESIA:  General plus femoral block.   ESTIMATED BLOOD LOSS:  Minimal.   ANTIBIOTICS:  Kefzol 2 g.   DRAINS:  None.   COMPLICATIONS:  None.   TOURNIQUET TIME:  44 minutes at 300 mmHg in the thigh.   DISPOSITION:  To PACU in stable condition.   INDICATIONS FOR PROCEDURE:  The patient is a 60 year old woman with  osteoarthritis of her left knee.  She has pain with activities of daily  living.  She has failed conservative care and presents at this time for  total knee arthroplasty.  Risks and benefits were discussed including  infection of neurovascular injury, persistent pain, DVT, pulmonary  embolus, and need for additional surgery.  The patient states she  understands and wished to proceed at this time.   DESCRIPTION OF PROCEDURE:  The patient was brought to OR room 15 after  undergoing a knee block.  She then underwent general anesthetic.  After  adequate level of anesthesia obtained, the patient's left lower  extremity was prepped using DuraPrep, draped in a sterile field.  An  Collier Flowers was used to cover all exposed skin.  A midline incision was made.  This was carried down to a medial parapatellar retinacular incision.  The patella was everted.  The canal was drilled and the IM guide  was  then used with 5 degrees of valgus, this was set to take 11 mm off the  distal femur.  The distal femoral cut was made.  Attention was then  focused on the tibia.  The external alignment guide was used with 3  degrees of posterior slope and this was set to take 9 mm off the tibia.  This showed an extension gap of about 11 mm, which was stable.  Attention was then focused on the femur, the femur was sized for a size  4.  The cutting block was placed and the size 4 chamfer cuts were made.  The #4 trial femur was then placed and the box cut was made using the  drill and the box cut puncture.  The tibial trial was then placed and  this was sequentially tried for 9, 11, and a 13-mm tibial trial.  The 13  mm had full extension, full flexion, and was stable with varus valgus  stress.  The flexion/extension gaps were balanced prior to inserting  the  implants.  The rotation was marked, the implants were removed, and the  keel punch was then made for the tibia.  The patella was resurfaced and  9 mm was taken off the patella.  This was sized for a 32 and 32 punch  cut was made for the patella.  The wound was then irrigated with normal  saline, the posterior aspect of the capsule was injected with a total of  40 mL of 0.25% Marcaine plain.  After irrigation and debridement, the  tibial and femoral components were cemented in place, loose cement was  removed.  The tibial tray was placed and the knee was placed in  extension until the cement hardened.  The patella was also placed and  kept in extension with the clamp in place until the cement hardened.  The clamps were removed.  The knee was placed through full range of  motion.  There was slight lateral subluxation of the patella and a  lateral release was performed.  This provided midline tracking of the  patella.  The wound was again irrigated.  The tourniquet was deflated  after 44 minutes and hemostasis was obtained.  The subcu was closed   using 2-0 Vicryl.  Skin was closed using approximate staples.  The  wounds were covered with Adaptic orthopedic sponges, Kerlix, and Coban.  The patient was then extubated and taken to PACU in stable condition.      Nadara Mustard, MD  Electronically Signed     MVD/MEDQ  D:  11/01/2008  T:  11/02/2008  Job:  [id number]

## 2010-10-21 NOTE — Discharge Summary (Signed)
NAMESHARNESE, [name]                ACCOUNT NO.:  [id number]   MEDICAL RECORD NO.:  [id number]          PATIENT TYPE:  OBV   LOCATION:  4707                         FACILITY:  [facility]   PHYSICIAN:  Peggye Pitt, M.D. DATE OF BIRTH:  01-Dec-1950   DATE OF ADMISSION:  03/15/2007  DATE OF DISCHARGE:  03/17/2007                               DISCHARGE SUMMARY   ATTENDING:  Madaline Guthrie, M.D.   DISCHARGE DIAGNOSES:  1. Chest pain, ruled out for acute myocardial infarction.  2. Hypertension.  3. Hyperlipidemia.   DISCHARGE MEDICATIONS:  1. Pravastatin 40 mg p.o. q.h.s.  2. Hydrochlorothiazide 25 mg p.o. daily.  3. Prozac 20 mg p.o. daily.  4. Aspirin 81 mg p.o. daily.  5. Calcium and vitamin D supplementation b.i.d.  6. Nitroglycerin 0.4 mg sublingually p.r.n. for chest pain.   DISPOSITION:  At the time of discharge the patient does not have a  primary care physician and she refuses for [place] to make an appointment  with her in our clinic.  She will follow up with Dr.  Patty Sermons on  March 18, 2007, for a stress Cardiolite.   PROCEDURE:  Include a 2D echocardiogram on March 16, 2007, consistent  with an ejection fraction of 60 to 65%.  No evidence of left ventricular  wall motion abnormalities.  Left ventricular wall thickness at the upper  limits of normal.  Features consistent with mild diastolic dysfunction.  The patient also had a chest x-ray on March 15, 2007, that showed a  left upper lobe calcified granuloma.   HISTORY AND PHYSICAL EXAM:  For full details, please refer to the chart,  but in brief Mr. Branford is a pleasant 60 year old white woman with past  medical history of hypertension and hyperlipidemia.  At about 9 a.m. on  the morning of admission she presented with diaphoresis, palpitations  and immediately started to have chest pain substernally described as  pressure and squeezing.  She thought she was going to pass out so she  had to sit down and wait for  approximately 10 to 15 minutes for the pain  to resolve.  The pain also radiated to her jaw complicated by shortness  of breath.   ALLERGIES:  SHE HAS NO KNOWN DRUG ALLERGIES.   PHYSICAL EXAMINATION:  VITAL SIGNS:  Upon admission temperature 97.6,  blood pressure 135/89, heart rate 91, respirations 12, O2 saturation 95%  on room air.   LABORATORY DATA:  Upon admission sodium 137, potassium 3.9, chloride  105, bicarb 27, BUN 31, creatinine 0.9, glucose 103, bilirubin 1.0,  alkaline phosphatase 58, AST 26, ALT 20, protein 7.8, albumin 4.3,  calcium of 10 and magnesium of 2.2.  WBCs of 5.6, hemoglobin 16.3,  platelets 282. She had 2 sets of negative point of care markers in the  emergency department.  Her EKG showed a normal sinus rhythm, rate of 94,  normal axis and no ST-T wave changes.   HOSPITAL COURSE:  1. Her chest pain.  We admitted her to telemetry to rule out acute      coronary syndrome.  Three sets of  cardiac enzymes were done which      came back negative.  She did have a 10-beat run of V-Tach or what      we thought was V-Tach during the hospital which concerned [place] enough      to consult cardiology.  However, they believe that it was probably      an artifact and instead have recommended her to be discharged and      follow up with them for an outpatient stress Cardiolite.  Chest      pain has resolved upon discharge.  2. Hyperlipidemia.  We have started her on pravastatin 400 mg p.o.      daily.  3. Hypertension.  Was stable throughout hospitalization.  She will go      home on HCTZ 25 mg daily.   Vital signs stable on the day of discharge, blood pressure 133/80, heart  rate 74, respirations 24, temperature 97, O2 saturation 96% on room air.   Labs on day of discharge WBC 6.0, hemoglobin 12.5, platelets 242, sodium  140, potassium 3.6, chloride 103, bicarb 28, BUN 17, creatinine 0.98 and  glucose of 98.      Peggye Pitt, M.D.  Electronically Signed      EH/MEDQ  D:  03/17/2007  T:  03/17/2007  Job:  [id number]   cc:   Cassell Clement, M.D.

## 2010-10-21 NOTE — Consult Note (Signed)
[name], [name]                ACCOUNT NO.:  [id number]   MEDICAL RECORD NO.:  [id number]          PATIENT TYPE:  OBV   LOCATION:  4707                         FACILITY:  [facility]   PHYSICIAN:  Cassell Clement, M.D. DATE OF BIRTH:  [birth year]   DATE OF CONSULTATION:  03/16/2007  DATE OF DISCHARGE:                                 CONSULTATION   HISTORY OF PRESENT ILLNESS:  This is a 60 year old single Caucasian  female admitted on March 15, 2007, with substernal chest pain radiating  to the jaw while at work.  She subsequently had a hot feelingbut did  not have syncope.  She subsequently felt like she might be  hyperventilating.  The entire episode lasted about 10 minutes.  A  coworker brought her to the emergency room where she was evaluated and  admitted by the internal medicine service.  She has had no subsequent  pain since yesterday when she was admitted, and in fact, at the time of  evaluation in the emergency room, she was asymptomatic.  Her cardiac  enzymes have been negative and her serial EKGs have been negative.  She  does not have any prior history of known heart disease but does have a  history of mild essential hypertension and has been on low-dose  hydrochlorothiazide 25 mg daily.   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  She is single.  She has no children.  She quit smoking 24  years ago.  She has 2-3 glasses of white wine per night.   FAMILY HISTORY:  Reveals that her father died of renal cancer.  Mother  died of lung cancer.  She has a brother who is living with testicular  cancer in is cured.  There is no family history of premature coronary  disease and no history in the family of high cholesterol as far as she  knows.   PREVIOUS SURGERY:  Tubal ligation.   ALLERGIES:  She has no known drug allergies.   REVIEW OF SYSTEMS:  Reveals a history of diverticulosis.  She has not  had any bladder symptoms.  Her last  menstrual period was 8 years ago.   HOME MEDICATIONS:  1. Hydrochlorothiazide 25 mg daily for blood pressure.  2. Prozac 20 mg daily.  3. Aspirin 81 mg daily.  4. Calcium 600 mg with vitamin D twice a day.  5. Multivitamin daily.   PHYSICAL EXAMINATION:  VITAL SIGNS:  Blood pressure is 146/83, pulse is  73 and regular, O2 sat is 98%, weight is 175.  CHEST:  Clear.  NECK:  Carotids reveal no bruits.  HEART:  Reveals no murmur, gallop, rub or click.  There is no chest wall  tenderness.  ABDOMEN:  Soft and nontender.  EXTREMITIES:  No phlebitis or edema.  Pedal pulses are good.   STUDIES:  Her 2-D echo shows normal systolic function and mild diastolic  dysfunction and no significant valvular abnormalities.  Her  electrocardiograms have been normal.  Telemetry strips in the chart are  consistent with artifact not ventricular tachycardia.  Chest x-ray is  normal.  Lipids are abnormal with a cholesterol of 244, LDL 163, HDL of  36.   IMPRESSION:  No evidence for acute MI.  The rhythm strips in the chart  appeared to be lead motion artifact.  She does, however, have risk  factors for coronary disease.   RECOMMENDATIONS:  Would continue with aspirin and add a statin.  Will  arrange for outpatient stress Cardiolite.  Okay for discharge home  tonight from my standpoint.  Pink sheet has been filled out.   Thanks for the opportunity to see this pleasant woman.           ______________________________  Cassell Clement, M.D.     TB/MEDQ  D:  03/16/2007  T:  03/17/2007  Job:  [id number]   cc:   Madaline Guthrie, M.D.  Lollie Sails, MD

## 2010-10-24 NOTE — Discharge Summary (Signed)
[name], [name]                ACCOUNT NO.:  [id number]   MEDICAL RECORD NO.:  [id number]          PATIENT TYPE:  INP   LOCATION:  5039                         FACILITY:  [facility]   PHYSICIAN:  Nadara Mustard, MD     DATE OF BIRTH:  03/11/1951   DATE OF ADMISSION:  11/01/2008  DATE OF DISCHARGE:  11/05/2008                               DISCHARGE SUMMARY   `   FINAL DIAGNOSIS:  Osteoarthritis, left knee.   PROCEDURE:  Left total knee arthroplasty.   DISPOSITION:  Discharged to home in stable condition with Home Health  physical therapy.  Plan to followup in the office in 2 weeks.   HISTORY OF PRESENT ILLNESS:  The patient is a 60 year old woman with  osteoarthritis of her left knee.  She had failed conservative care and  pain with activities of daily living, presented at this time for total  knee arthroplasty.  The patient's hospital course was essentially  unremarkable.  She underwent a left total knee arthroplasty on Nov 01, 2008 with DePuy components #4 femur, #3 tibia, 13-mm poly tray, and a 32-  mm patella.  Maud Deed, [facility] assisted.  The patient received Kefzol 2  grams for infection prophylaxis and Coumadin for DVT prophylaxis as well  as compressive hose.  Postoperatively, the patient progressed well with  physical therapy.  Her hemoglobin was 10.2 on Nov 02, 2008.  The  patient's hemoglobin dropped to 9.2 on Nov 04, 2008.  She received  potassium supplements.  The patient progressed well, was stable with  ambulation, and was discharged to home on Nov 05, 2008 with  prescriptions for Vicodin and Coumadin.  Follow up in the office in 2  weeks.      Nadara Mustard, MD  Electronically Signed     MVD/MEDQ  D:  12/12/2008  T:  12/12/2008  Job:  [id number]

## 2011-01-28 ENCOUNTER — Other Ambulatory Visit: Payer: Self-pay | Admitting: *Deleted

## 2011-01-28 ENCOUNTER — Encounter: Payer: Self-pay | Admitting: *Deleted

## 2011-01-28 MED ORDER — PRAVASTATIN SODIUM 40 MG PO TABS: 40.0000 mg | ORAL_TABLET | Freq: Every day | ORAL | Status: DC

## 2011-01-28 NOTE — Telephone Encounter (Signed)
Rx refill sent to [facility] on [address] in [facility]. Patient is due for follow up appointment.

## 2011-01-28 NOTE — Telephone Encounter (Signed)
Patient called and left voice message requesting refill on Pravastatin.

## 2011-02-06 ENCOUNTER — Telehealth: Payer: Self-pay | Admitting: *Deleted

## 2011-02-06 NOTE — Telephone Encounter (Signed)
Patient called and left voice message stating she wanted Dr Artist Pais to be aware that she has weaned herself off of her Fluoxetine 20 mg . Her message states she started weaning in June and totally stopped taking the medication on July. She stated she is doing fine without it,and just wanted Dr Artist Pais to be aware.

## 2011-02-06 NOTE — Telephone Encounter (Signed)
Noted  

## 2011-02-25 ENCOUNTER — Telehealth: Payer: Self-pay | Admitting: *Deleted

## 2011-02-25 NOTE — Telephone Encounter (Signed)
Call placed to patient regarding gun permit received in office. She was advised office visit was needed, because she has not been seen in close to 2 years. Patient has verbalized understanding and has scheduled for Monday 03/02/2011 @ 11am with Dr Rodena Medin.

## 2011-03-02 ENCOUNTER — Telehealth: Payer: Self-pay | Admitting: *Deleted

## 2011-03-02 ENCOUNTER — Ambulatory Visit (INDEPENDENT_AMBULATORY_CARE_PROVIDER_SITE_OTHER): Payer: Self-pay | Admitting: Internal Medicine

## 2011-03-02 ENCOUNTER — Encounter: Payer: Self-pay | Admitting: Internal Medicine

## 2011-03-02 DIAGNOSIS — E739 Lactose intolerance, unspecified: Secondary | ICD-10-CM

## 2011-03-02 DIAGNOSIS — Z79899 Other long term (current) drug therapy: Secondary | ICD-10-CM

## 2011-03-02 DIAGNOSIS — E785 Hyperlipidemia, unspecified: Secondary | ICD-10-CM

## 2011-03-02 MED ORDER — METOPROLOL TARTRATE 50 MG PO TABS: 50.0000 mg | ORAL_TABLET | Freq: Two times a day (BID) | ORAL | Status: DC

## 2011-03-02 MED ORDER — PRAVASTATIN SODIUM 40 MG PO TABS: 40.0000 mg | ORAL_TABLET | Freq: Every day | ORAL | Status: DC

## 2011-03-02 NOTE — Patient Instructions (Signed)
Please schedule fasting lipid/lft (272.4) and chem7 (v58.69) tomorrow morning 9/25 Also please schedule lipid/lft 272.4 and chem7 v58.69 prior to next appointment

## 2011-03-02 NOTE — Telephone Encounter (Signed)
Future lab orders placed per order from Dr Rodena Medin and forwarded to the lab.

## 2011-03-03 LAB — BASIC METABOLIC PANEL
CO2: 25 mEq/L (ref 19–32)
Calcium: 9.7 mg/dL (ref 8.4–10.5)
Creat: 0.81 mg/dL (ref 0.50–1.10)

## 2011-03-04 LAB — LIPID PANEL
HDL: 47 mg/dL (ref >39)
Triglycerides: 275 mg/dL — ABNORMAL HIGH (ref <150)

## 2011-03-04 LAB — HEPATIC FUNCTION PANEL
AST: 24 U/L (ref 0–37)
Albumin: 4.4 g/dL (ref 3.5–5.2)
Alkaline Phosphatase: 53 U/L (ref 39–117)
Total Protein: 7.5 g/dL (ref 6.0–8.3)

## 2011-03-08 NOTE — Progress Notes (Signed)
  Subjective:    Patient ID: [name], female    DOB: [birth year], 60 y.o.   MRN: [id number]  HPI Pt presents to clinic for followup of multiple medical problems. H/o glucose intolerance without dx of dm. bp under average control. Tolerates statin tx without myalgias or abn lfts. H/o postmenopausal sx's and took ssri. States successfully weaned off ssri without difficulty. Mood nl. Has CCW permit paperwork to be completed. No other complaints.  Past Medical History  Diagnosis Date  . History  of basal cell carcinoma   . History of cardiac arrhythmia   . Arthritis   . Glaucoma   . Hyperlipidemia   . Essential hypertension, benign   . Glucose intolerance (impaired glucose tolerance)   . Depression    Past Surgical History  Procedure Date  . Dilation and curettage of uterus 1966  . Knee surgery 11/02/2010    left knee total replacement Dr Lajoyce Corners    reports that she quit smoking about 28 years ago. Her smoking use included Cigarettes. She has never used smokeless tobacco. She reports that she drinks alcohol. She reports that she does not use illicit drugs. family history includes Coronary artery disease in her paternal grandmother; Hyperlipidemia in her paternal grandmother and unspecified family member; Hypertension in her father, maternal grandfather, maternal grandmother, mother, paternal grandfather, and paternal grandmother; Kidney disease in her father; Lung cancer in her mother; Prostate cancer in her father; Stroke in her paternal grandmother; and Uterine cancer in her mother. Allergies  Allergen Reactions  . Hydrocodone-Acetaminophen     REACTION: breathing problems     Review of Systems see hpi     Objective:   Physical Exam  Physical Exam  Nursing note and vitals reviewed. Constitutional: Appears well-developed and well-nourished. No distress.  HENT:  Head: Normocephalic and atraumatic.  Right Ear: External ear normal.  Left Ear: External ear normal.  Eyes:  Conjunctivae are normal. No scleral icterus.  Neck: Neck supple. Carotid bruit is not present.  Cardiovascular: Normal rate, regular rhythm and normal heart sounds.  Exam reveals no gallop and no friction rub.   No murmur heard. Pulmonary/Chest: Effort normal and breath sounds normal. No respiratory distress. He has no wheezes. no rales.  Lymphadenopathy:    He has no cervical adenopathy.  Neurological:Alert.  Skin: Skin is warm and dry. Not diaphoretic.  Psychiatric: Has a normal mood and affect.        Assessment & Plan:

## 2011-03-08 NOTE — Assessment & Plan Note (Signed)
Obtain chem7. Low sugar/carb diet, exercise and wt loss recommended.

## 2011-03-08 NOTE — Assessment & Plan Note (Signed)
Obtain lipid/lft. 

## 2011-03-16 ENCOUNTER — Other Ambulatory Visit: Payer: Self-pay | Admitting: Internal Medicine

## 2011-03-16 DIAGNOSIS — E785 Hyperlipidemia, unspecified: Secondary | ICD-10-CM

## 2011-03-16 MED ORDER — PRAVASTATIN SODIUM 80 MG PO TABS: 80.0000 mg | ORAL_TABLET | Freq: Every day | ORAL | Status: DC

## 2011-03-19 LAB — CBC
HCT: 36.3
Hemoglobin: 12.5
Hemoglobin: 14.6
Platelets: 242
RDW: 13.3
WBC: 6

## 2011-03-19 LAB — URINE DRUGS OF ABUSE SCREEN W ALC, ROUTINE (REF LAB)
Amphetamine Screen, Ur: NEGATIVE
Cocaine Metabolites: NEGATIVE
Creatinine,U: 59.3
Ethyl Alcohol: <5
Marijuana Metabolite: NEGATIVE
Opiate Screen, Urine: NEGATIVE

## 2011-03-19 LAB — BASIC METABOLIC PANEL
BUN: 17
CO2: 29
Calcium: 9.4
Chloride: 106
GFR calc non Af Amer: 58 — ABNORMAL LOW
Glucose, Bld: 94
Potassium: 3.6
Potassium: 4
Sodium: 140
Sodium: 140

## 2011-03-19 LAB — CARDIAC PANEL(CRET KIN+CKTOT+MB+TROPI)
CK, MB: 0.8
CK, MB: 1
Relative Index: INVALID
Total CK: 52
Total CK: 58
Troponin I: 0.02

## 2011-03-19 LAB — COMPREHENSIVE METABOLIC PANEL
AST: 26
Albumin: 4.3
Chloride: 102
Creatinine, Ser: 0.86
GFR calc Af Amer: >60
Total Bilirubin: 1

## 2011-03-19 LAB — DIFFERENTIAL
Basophils Absolute: 0
Lymphocytes Relative: 42
Monocytes Absolute: 0.4
Neutro Abs: 2.7
Neutrophils Relative %: 49

## 2011-03-19 LAB — I-STAT 8, (EC8 V) (CONVERTED LAB)
Acid-Base Excess: 1
Bicarbonate: 27.4 — ABNORMAL HIGH
HCT: 48 — ABNORMAL HIGH
Operator id: 198171
TCO2: 29
pCO2, Ven: 47.1

## 2011-03-19 LAB — POCT CARDIAC MARKERS
CKMB, poc: <1 — ABNORMAL LOW
Operator id: 198171
Troponin i, poc: <0.05
Troponin i, poc: <0.05

## 2011-03-19 LAB — URINALYSIS, ROUTINE W REFLEX MICROSCOPIC
Bilirubin Urine: NEGATIVE
Glucose, UA: NEGATIVE
Hgb urine dipstick: NEGATIVE
Ketones, ur: NEGATIVE
Protein, ur: NEGATIVE

## 2011-03-19 LAB — URINE MICROSCOPIC-ADD ON

## 2011-03-19 LAB — LIPID PANEL
Cholesterol: 244 — ABNORMAL HIGH
HDL: 36 — ABNORMAL LOW

## 2011-03-19 LAB — CK TOTAL AND CKMB (NOT AT ARMC): CK, MB: 0.9

## 2011-03-19 LAB — POCT I-STAT CREATININE: Creatinine, Ser: 0.9

## 2011-03-26 ENCOUNTER — Telehealth: Payer: Self-pay | Admitting: *Deleted

## 2011-03-26 ENCOUNTER — Other Ambulatory Visit: Payer: Self-pay | Admitting: Internal Medicine

## 2011-03-26 DIAGNOSIS — Z1231 Encounter for screening mammogram for malignant neoplasm of breast: Secondary | ICD-10-CM

## 2011-03-26 DIAGNOSIS — E785 Hyperlipidemia, unspecified: Secondary | ICD-10-CM

## 2011-03-26 MED ORDER — PRAVASTATIN SODIUM 40 MG PO TABS: ORAL_TABLET | ORAL | Status: DC

## 2011-03-26 NOTE — Telephone Encounter (Signed)
Yes. The issue is 40mg  is on the [facility] $[facility]/$10 list and the 80mg  isn't. Can change the prescription to say 40mg  take two po qd. Quantity either 30d or 90d depending on what she wants.

## 2011-03-26 NOTE — Telephone Encounter (Signed)
Call placed to patient she has requested a 90 day supply. Rx changed to reflect 40 mg 2 tablets by mouth once a day.

## 2011-03-26 NOTE — Telephone Encounter (Signed)
Addended by: Glendell Docker on: 03/26/2011 02:45 PM   Modules accepted: Orders

## 2011-03-26 NOTE — Telephone Encounter (Signed)
Patient called and left voice message stating her Pravastatin was increased from 40 mg to 80 mg. Her message stated she had requested a 90 day supply, however the 90 day supply will cost her  $140. She would like to know if Dr Rodena Medin would write the Rx for 40 mg bid and a 30 day supply so she could have the medication filled for $10.

## 2011-04-17 ENCOUNTER — Ambulatory Visit (HOSPITAL_COMMUNITY)
Admission: RE | Admit: 2011-04-17 | Discharge: 2011-04-17 | Disposition: A | Discharge status: Home / Self Care | Payer: Self-pay | Source: Ambulatory Visit | Attending: Internal Medicine | Admitting: Internal Medicine

## 2011-04-17 DIAGNOSIS — Z1231 Encounter for screening mammogram for malignant neoplasm of breast: Secondary | ICD-10-CM

## 2011-05-12 ENCOUNTER — Encounter: Payer: Self-pay | Admitting: Internal Medicine

## 2011-05-12 ENCOUNTER — Ambulatory Visit (INDEPENDENT_AMBULATORY_CARE_PROVIDER_SITE_OTHER): Payer: Self-pay | Admitting: Internal Medicine

## 2011-05-12 VITALS — BP 122/72 | HR 90 | Temp 97.9°F | Resp 18 | Wt 195.0 lb

## 2011-05-12 DIAGNOSIS — J329 Chronic sinusitis, unspecified: Secondary | ICD-10-CM

## 2011-05-12 MED ORDER — AMOXICILLIN-POT CLAVULANATE 875-125 MG PO TABS: 1.0000 | ORAL_TABLET | Freq: Two times a day (BID) | ORAL | Status: AC

## 2011-05-13 DIAGNOSIS — J329 Chronic sinusitis, unspecified: Secondary | ICD-10-CM | POA: Insufficient documentation

## 2011-05-13 NOTE — Assessment & Plan Note (Signed)
Begin augmentin. Followup if no improvement or worsening.  

## 2011-05-13 NOTE — Progress Notes (Signed)
  Subjective:    Patient ID: [name], female    DOB: 1951-03-12, 60 y.o.   MRN: [id number]  HPI Pt presents to clinic for evaluation of possible sinusitis. Notes yellow nasal drainage and left maxillary pain/pressure. Duration >1wk. No exacerbating or alleviating factors. Taking otc medication without significant improvement. No other complaints.  Past Medical History  Diagnosis Date  . History  of basal cell carcinoma   . History of cardiac arrhythmia   . Arthritis   . Glaucoma   . Hyperlipidemia   . Essential hypertension, benign   . Glucose intolerance (impaired glucose tolerance)   . Depression    Past Surgical History  Procedure Date  . Dilation and curettage of uterus 1966  . Knee surgery 11/02/2010    left knee total replacement Dr Lajoyce Corners    reports that she quit smoking about 28 years ago. Her smoking use included Cigarettes. She has never used smokeless tobacco. She reports that she drinks alcohol. She reports that she does not use illicit drugs. family history includes Coronary artery disease in her paternal grandmother; Hyperlipidemia in her paternal grandmother and unspecified family member; Hypertension in her father, maternal grandfather, maternal grandmother, mother, paternal grandfather, and paternal grandmother; Kidney disease in her father; Lung cancer in her mother; Prostate cancer in her father; Stroke in her paternal grandmother; and Uterine cancer in her mother. Allergies  Allergen Reactions  . Hydrocodone-Acetaminophen     REACTION: breathing problems     Review of Systems see hpi     Objective:   Physical Exam  Nursing note and vitals reviewed. Constitutional: She appears well-developed and well-nourished. No distress.  HENT:  Head: Normocephalic and atraumatic.  Right Ear: Tympanic membrane, external ear and ear canal normal.  Left Ear: Tympanic membrane, external ear and ear canal normal.  Nose: Left sinus exhibits maxillary sinus tenderness.    Mouth/Throat: Oropharynx is clear and moist. No oropharyngeal exudate.  Eyes: Conjunctivae are normal.  Neck: Neck supple.  Neurological: She is alert.  Skin: Skin is warm and dry. She is not diaphoretic.  Psychiatric: She has a normal mood and affect.          Assessment & Plan:

## 2011-05-15 ENCOUNTER — Other Ambulatory Visit: Payer: Self-pay | Admitting: Internal Medicine

## 2011-05-15 NOTE — Telephone Encounter (Signed)
Medication discontinued in September

## 2011-05-25 ENCOUNTER — Other Ambulatory Visit: Payer: Self-pay | Admitting: Internal Medicine

## 2011-05-26 NOTE — Telephone Encounter (Signed)
Rx refill sent to pharmacy. 

## 2011-06-11 ENCOUNTER — Telehealth: Payer: Self-pay | Admitting: Internal Medicine

## 2011-06-11 MED ORDER — AMOXICILLIN-POT CLAVULANATE 875-125 MG PO TABS: 1.0000 | ORAL_TABLET | Freq: Two times a day (BID) | ORAL | Status: AC

## 2011-06-11 NOTE — Telephone Encounter (Signed)
Call returned to patient she was informed of Rx approval to pharmacy.

## 2011-06-11 NOTE — Telephone Encounter (Signed)
Call placed to patient at [phone]. She states had improved for a couple of weeks, however the same symptoms she had at her last office visit are back. She states she has a dry cough , nasal drip into her throat, throat irritation, and a constant clearing of her throat. She would like to know if Dr Rodena Medin would provide a refill on Augmentin.

## 2011-06-11 NOTE — Telephone Encounter (Signed)
Refill- augmentin 875mg  tab. Take one tablet by mouth twice daily. Qty 14 last fill 12.4.12

## 2011-06-11 NOTE — Telephone Encounter (Signed)
ok 

## 2011-07-24 ENCOUNTER — Telehealth: Payer: Self-pay | Admitting: *Deleted

## 2011-07-24 MED ORDER — VALACYCLOVIR HCL 500 MG PO TABS: ORAL_TABLET | ORAL | Status: DC

## 2011-07-24 NOTE — Telephone Encounter (Signed)
Rx sent to pharmacy per Dr Rodena Medin okay. Call placed to patient  [phone], she was informed of medication to pharmacy.

## 2011-07-24 NOTE — Telephone Encounter (Signed)
Ok to send

## 2011-07-24 NOTE — Telephone Encounter (Signed)
Patient called and left voice message stating she will be having permanent lip liner placed and was advised to contact her doctor for medication. She stated when she had a previous session she broke out in cold sores around her lips. She was advised to have a Rx obtained for Valtrex 500  X 5 days before procedure and 3 tablets day of procedure. If approved she is requesting Rx sent to [facility] on [address] in [facility].

## 2011-08-20 ENCOUNTER — Encounter: Payer: Self-pay | Admitting: *Deleted

## 2011-09-17 ENCOUNTER — Telehealth: Payer: Self-pay | Admitting: Internal Medicine

## 2011-09-17 MED ORDER — METOPROLOL TARTRATE 50 MG PO TABS: 50.0000 mg | ORAL_TABLET | Freq: Two times a day (BID) | ORAL | Status: DC

## 2011-09-17 MED ORDER — PRAVASTATIN SODIUM 40 MG PO TABS: 40.0000 mg | ORAL_TABLET | Freq: Every day | ORAL | Status: DC

## 2011-09-17 NOTE — Telephone Encounter (Signed)
Call placed to patient at [phone], she was informed that she is past due for blood work and follow up. She stated that she does not have any insurance at present and trying to keep he expenses to a minimum. She was advised to have blood work done as requested by Dr Rodena Medin. Patient stated that she will have the blood work done, and she has scheduled follow up for July 25,2013 @ 11am.

## 2011-09-17 NOTE — Telephone Encounter (Signed)
Refill-metoprolol tart 50mg  tab. Take one tablet by mouth twice daily. Qty 180 last fill 1.2.13

## 2011-11-10 ENCOUNTER — Ambulatory Visit: Payer: Self-pay

## 2011-12-18 ENCOUNTER — Telehealth: Payer: Self-pay | Admitting: Internal Medicine

## 2011-12-18 ENCOUNTER — Other Ambulatory Visit: Payer: Self-pay | Admitting: *Deleted

## 2011-12-18 MED ORDER — HYDROCHLOROTHIAZIDE 12.5 MG PO CAPS: 12.5000 mg | ORAL_CAPSULE | Freq: Every day | ORAL | Status: DC

## 2011-12-18 MED ORDER — METOPROLOL TARTRATE 50 MG PO TABS: 50.0000 mg | ORAL_TABLET | Freq: Two times a day (BID) | ORAL | Status: DC

## 2011-12-18 NOTE — Telephone Encounter (Signed)
Refill- metoprolol tart 50mg  tab. Take one tablet by mouth twice daily. Qty 180 last fill 4.11.13

## 2011-12-18 NOTE — Telephone Encounter (Signed)
Ref[facility] to [facility] and metorolol tart

## 2011-12-18 NOTE — Telephone Encounter (Signed)
Refill- hydrochlorot 12.5mg  cap. Take one capsule by mouth every day. Qty 90 last fill 4.2.13

## 2011-12-31 ENCOUNTER — Ambulatory Visit: Payer: Self-pay | Admitting: Internal Medicine

## 2012-01-08 LAB — HEPATIC FUNCTION PANEL
Alkaline Phosphatase: 50 U/L (ref 39–117)
Bilirubin, Direct: 0.1 mg/dL (ref 0.0–0.3)
Indirect Bilirubin: 0.3 mg/dL (ref 0.0–0.9)
Total Protein: 6.6 g/dL (ref 6.0–8.3)

## 2012-01-08 NOTE — Telephone Encounter (Signed)
Pt presented to the lab, future orders released. 

## 2012-01-08 NOTE — Addendum Note (Signed)
Addended by: Mervin Kung A on: 01/08/2012 09:39 AM   Modules accepted: Orders

## 2012-01-09 LAB — LIPID PANEL
Cholesterol: 212 mg/dL — ABNORMAL HIGH (ref 0–200)
HDL: 43 mg/dL
LDL Cholesterol: 124 mg/dL — ABNORMAL HIGH (ref 0–99)
Total CHOL/HDL Ratio: 4.9 ratio
Triglycerides: 223 mg/dL — ABNORMAL HIGH
VLDL: 45 mg/dL — ABNORMAL HIGH (ref 0–40)

## 2012-01-28 ENCOUNTER — Encounter: Payer: Self-pay | Admitting: Internal Medicine

## 2012-01-28 ENCOUNTER — Ambulatory Visit (INDEPENDENT_AMBULATORY_CARE_PROVIDER_SITE_OTHER): Payer: Self-pay | Admitting: Internal Medicine

## 2012-01-28 VITALS — BP 128/78 | HR 85 | Temp 98.2°F | Resp 16 | Wt 193.5 lb

## 2012-01-28 DIAGNOSIS — I10 Essential (primary) hypertension: Secondary | ICD-10-CM

## 2012-01-28 DIAGNOSIS — E785 Hyperlipidemia, unspecified: Secondary | ICD-10-CM

## 2012-01-28 MED ORDER — PRAVASTATIN SODIUM 40 MG PO TABS: 40.0000 mg | ORAL_TABLET | Freq: Every day | ORAL | Status: DC

## 2012-01-28 MED ORDER — HYDROCHLOROTHIAZIDE 12.5 MG PO CAPS: 12.5000 mg | ORAL_CAPSULE | Freq: Every day | ORAL | Status: DC

## 2012-01-28 MED ORDER — OMEPRAZOLE 20 MG PO CPDR: 20.0000 mg | DELAYED_RELEASE_CAPSULE | Freq: Every day | ORAL | Status: DC

## 2012-01-28 MED ORDER — VALACYCLOVIR HCL 500 MG PO TABS: 500.0000 mg | ORAL_TABLET | Freq: Two times a day (BID) | ORAL | Status: DC | PRN

## 2012-01-28 MED ORDER — METOPROLOL TARTRATE 50 MG PO TABS: 50.0000 mg | ORAL_TABLET | Freq: Two times a day (BID) | ORAL | Status: DC

## 2012-01-28 NOTE — Progress Notes (Signed)
  Subjective:    Patient ID: [name], female    DOB: 30-Oct-1950, 61 y.o.   MRN: [id number]  HPI Pt presents to clinic for followup of multiple medical problems. Tolerating statin tx. Weight stable. bp reviewed normotensive.   Past Medical History  Diagnosis Date  . History  of basal cell carcinoma   . History of cardiac arrhythmia   . Arthritis   . Glaucoma   . Hyperlipidemia   . Essential hypertension, benign   . Glucose intolerance (impaired glucose tolerance)   . Depression    Past Surgical History  Procedure Date  . Dilation and curettage of uterus 1966  . Knee surgery 11/02/2010    left knee total replacement Dr Lajoyce Corners    reports that she quit smoking about 29 years ago. Her smoking use included Cigarettes. She has never used smokeless tobacco. She reports that she drinks alcohol. She reports that she does not use illicit drugs. family history includes Coronary artery disease in her paternal grandmother; Hyperlipidemia in her paternal grandmother and unspecified family member; Hypertension in her father, maternal grandfather, maternal grandmother, mother, paternal grandfather, and paternal grandmother; Kidney disease in her father; Lung cancer in her mother; Prostate cancer in her father; Stroke in her paternal grandmother; and Uterine cancer in her mother. No Known Allergies    Review of Systems see hpi     Objective:   Physical Exam  Physical Exam  Nursing note and vitals reviewed. Constitutional: Appears well-developed and well-nourished. No distress.  HENT:  Head: Normocephalic and atraumatic.  Right Ear: External ear normal.  Left Ear: External ear normal.  Eyes: Conjunctivae are normal. No scleral icterus.  Neck: Neck supple. Carotid bruit is not present.  Cardiovascular: Normal rate, regular rhythm and normal heart sounds.  Exam reveals no gallop and no friction rub.   No murmur heard. Pulmonary/Chest: Effort normal and breath sounds normal. No respiratory  distress. He has no wheezes. no rales.  Lymphadenopathy:    He has no cervical adenopathy.  Neurological:Alert.  Skin: Skin is warm and dry. Not diaphoretic.  Psychiatric: Has a normal mood and affect.        Assessment & Plan:

## 2012-01-28 NOTE — Assessment & Plan Note (Signed)
Improved control. Continue current statin dosing. Encourage low fat diet, exercise and weight loss.

## 2012-01-28 NOTE — Patient Instructions (Signed)
Please schedule fasting labs prior to next visit chem7-v58.69 and lipid/lft-272.4 

## 2012-01-28 NOTE — Assessment & Plan Note (Signed)
Normotensive and stable. Continue current regimen. Monitor bp as outpt and followup in clinic as scheduled. Obtain chem7 prior to next visit 

## 2012-07-08 ENCOUNTER — Telehealth: Payer: Self-pay | Admitting: *Deleted

## 2012-07-08 DIAGNOSIS — E785 Hyperlipidemia, unspecified: Secondary | ICD-10-CM

## 2012-07-08 MED ORDER — LOVASTATIN 20 MG PO TABS: 40.0000 mg | ORAL_TABLET | Freq: Every day | ORAL | Status: DC

## 2012-07-08 NOTE — Telephone Encounter (Signed)
OK to D/C pravastatin and start lovastatin 40 (2 tabs) daily.  Repeat flp.lft in 6 weeks (dx hyperlipidemia).  Call if unusual muscle pain occurs while taking drug.

## 2012-07-08 NOTE — Telephone Encounter (Signed)
Received message from pt that [facility] no longer has pravastatin on their $4 list. They now have lovastatin 10 or 20mg . Pt wants to know if she can change to this as she is without insurance and would like to get a med on the $4 list?  Please advise.

## 2012-07-08 NOTE — Telephone Encounter (Signed)
Left message for pt to return my call.

## 2012-07-11 NOTE — Telephone Encounter (Signed)
Notified pt and she is agreeable with change. Rx sent for lovastatin. Future lab order placed for the week of 08/22/12. Copy given to the lab and mailed to pt.

## 2012-07-26 ENCOUNTER — Encounter: Payer: Self-pay | Admitting: Family

## 2012-07-26 ENCOUNTER — Telehealth: Payer: Self-pay | Admitting: Internal Medicine

## 2012-07-26 LAB — LIPID PANEL
Cholesterol: 185 mg/dL (ref 0–200)
HDL: 47 mg/dL
LDL Cholesterol: 93 mg/dL (ref 0–99)
Total CHOL/HDL Ratio: 3.9 ratio
Triglycerides: 226 mg/dL — ABNORMAL HIGH
VLDL: 45 mg/dL — ABNORMAL HIGH (ref 0–40)

## 2012-07-26 MED ORDER — OMEPRAZOLE 20 MG PO CPDR: 20.0000 mg | DELAYED_RELEASE_CAPSULE | Freq: Every day | ORAL | Status: DC

## 2012-07-26 NOTE — Telephone Encounter (Signed)
Refill- omeprazole 20mg  cap. Take one capsule by mouth every day. Qty 180 last fill 9.5.13

## 2012-07-27 ENCOUNTER — Telehealth: Payer: Self-pay | Admitting: Internal Medicine

## 2012-07-27 MED ORDER — OMEPRAZOLE 20 MG PO CPDR: 20.0000 mg | DELAYED_RELEASE_CAPSULE | Freq: Every day | ORAL | Status: DC

## 2012-07-27 NOTE — Addendum Note (Signed)
Addended by: Court Joy on: 07/27/2012 02:20 PM   Modules accepted: Orders

## 2012-07-27 NOTE — Telephone Encounter (Signed)
Patient states that the omeprazole refill was sent to the wrong pharmacy. She says that refill was supposed to be sent to [facility] in [facility]. She is also requesting 6-12 month refill on this medication

## 2012-08-15 ENCOUNTER — Telehealth: Payer: Self-pay

## 2012-08-15 MED ORDER — LOVASTATIN 20 MG PO TABS: 40.0000 mg | ORAL_TABLET | Freq: Every day | ORAL | Status: DC

## 2012-08-15 NOTE — Telephone Encounter (Signed)
Pt left a message stating that she would like Lovastatin 20 mg sent to [facility] on [address]. And to send a 90 days supply.  RX sent

## 2012-10-28 ENCOUNTER — Other Ambulatory Visit: Payer: Self-pay | Admitting: Internal Medicine

## 2012-10-28 NOTE — Telephone Encounter (Signed)
Left message for patient to return my call.

## 2012-10-28 NOTE — Telephone Encounter (Signed)
Pt last seen 01/28/12 and advised f/u in January 2014. Pt has no appts on file and is past due. Please call pt to arrange appt. 30 day supply metoprolol sent to pharmacy.  We will be unable to provide further refills until he can be seen.

## 2012-10-28 NOTE — Telephone Encounter (Signed)
Patient returned phone call. I informed patient that a 30 day supply has been sent to the pharmacy and that she needs to be seen before further refills can be given. Patient states that the 1st available that she can make it into the office to be seen in in July. Appointment made for 12/26/12

## 2012-11-28 ENCOUNTER — Other Ambulatory Visit (HOSPITAL_COMMUNITY)
Admission: RE | Admit: 2012-11-28 | Discharge: 2012-11-28 | Disposition: A | Discharge status: Home / Self Care | Payer: BC Managed Care – PPO | Source: Ambulatory Visit | Attending: Family | Admitting: Family

## 2012-11-28 ENCOUNTER — Ambulatory Visit (HOSPITAL_BASED_OUTPATIENT_CLINIC_OR_DEPARTMENT_OTHER)
Admission: RE | Admit: 2012-11-28 | Discharge: 2012-11-28 | Disposition: A | Discharge status: Home / Self Care | Payer: BC Managed Care – PPO | Source: Ambulatory Visit | Attending: Family | Admitting: Family

## 2012-11-28 ENCOUNTER — Other Ambulatory Visit: Payer: Self-pay | Admitting: Family

## 2012-11-28 ENCOUNTER — Encounter: Payer: Self-pay | Admitting: Family

## 2012-11-28 ENCOUNTER — Ambulatory Visit (INDEPENDENT_AMBULATORY_CARE_PROVIDER_SITE_OTHER): Payer: BC Managed Care – PPO | Admitting: Family

## 2012-11-28 VITALS — BP 110/70 | HR 70 | Temp 97.6°F | Resp 18 | Ht 60.0 in | Wt 190.0 lb

## 2012-11-28 DIAGNOSIS — Z0000 Encounter for general adult medical examination without abnormal findings: Secondary | ICD-10-CM | POA: Insufficient documentation

## 2012-11-28 DIAGNOSIS — Z01419 Encounter for gynecological examination (general) (routine) without abnormal findings: Secondary | ICD-10-CM

## 2012-11-28 DIAGNOSIS — Z1231 Encounter for screening mammogram for malignant neoplasm of breast: Secondary | ICD-10-CM | POA: Insufficient documentation

## 2012-11-28 DIAGNOSIS — Z23 Encounter for immunization: Secondary | ICD-10-CM

## 2012-11-28 HISTORY — DX: Encounter for general adult medical examination without abnormal findings: Z00.00

## 2012-11-28 LAB — HEPATIC FUNCTION PANEL
Albumin: 4.4 g/dL (ref 3.5–5.2)
Bilirubin, Direct: 0.1 mg/dL (ref 0.0–0.3)
Total Bilirubin: 0.4 mg/dL (ref 0.3–1.2)

## 2012-11-28 LAB — CBC WITH DIFFERENTIAL/PLATELET
Eosinophils Relative: 2 % (ref 0–5)
HCT: 39.9 % (ref 36.0–46.0)
Lymphocytes Relative: 44 % (ref 12–46)
Lymphs Abs: 2.6 10*3/uL (ref 0.7–4.0)
MCV: 90.3 fL (ref 78.0–100.0)
Monocytes Absolute: 0.4 10*3/uL (ref 0.1–1.0)
Neutro Abs: 2.8 10*3/uL (ref 1.7–7.7)
Platelets: 243 10*3/uL (ref 150–400)
RBC: 4.42 MIL/uL (ref 3.87–5.11)
WBC: 5.9 10*3/uL (ref 4.0–10.5)

## 2012-11-28 NOTE — Progress Notes (Signed)
Subjective:    Patient ID: [name], female    DOB: 09/04/1950, 62 y.o.   MRN: [id number]  HPI    Patient presents today for complete physical.  Immunizations: due for zostavax Exercise: none Colonoscopy: pt reports colo 2011 at [place].  She will bring report.   Dexa: due Pap Smear:due Mammogram: due     Review of Systems  Constitutional: Negative for unexpected weight change.  HENT: Negative for congestion.   Eyes: Negative for visual disturbance.  Respiratory: Negative for cough and shortness of breath.   Cardiovascular: Negative for chest pain and leg swelling.  Gastrointestinal: Negative for nausea, vomiting and diarrhea.  Genitourinary: Negative for dysuria and frequency.  Musculoskeletal: Negative for myalgias and arthralgias.  Skin: Negative for rash.  Neurological: Negative for headaches.  Hematological: Negative for adenopathy.  Psychiatric/Behavioral:       Denies depression/anxiety   Past Medical History  Diagnosis Date  . History  of basal cell carcinoma   . History of cardiac arrhythmia   . Arthritis   . Glaucoma   . Hyperlipidemia   . Essential hypertension, benign   . Glucose intolerance (impaired glucose tolerance)   . Depression     History   Social History  . Marital Status: Single    Spouse Name: N/A    Number of Children: N/A  . Years of Education: N/A   Occupational History  . Not on file.   Social History Main Topics  . Smoking status: Former Smoker    Types: Cigarettes    Quit date: 06/08/1982  . Smokeless tobacco: Never Used  . Alcohol Use: Yes     Comment: 3-4 drinks daily  . Drug Use: No  . Sexually Active: Not on file   Other Topics Concern  . Not on file   Social History Narrative   Occupation: unemployed Education administrator- now retired   Divorced   no children    Moved from [place]    Lives alone with 4 cats   Enjoys gardening, planting.              Past Surgical History  Procedure Laterality  Date  . Dilation and curettage of uterus  1966  . Knee surgery  11/02/2010    left knee total replacement Dr Lajoyce Corners    Family History  Problem Relation Age of Onset  . Coronary artery disease Paternal Grandmother   . Hyperlipidemia    . Hyperlipidemia Paternal Grandmother   . Hypertension Mother   . Hypertension Father   . Hypertension Maternal Grandmother   . Hypertension Maternal Grandfather   . Hypertension Paternal Grandmother   . Hypertension Paternal Grandfather   . Kidney disease Father     Renal Cell Carcinoma  . Lung cancer Mother     smoker  . Prostate cancer Father   . Stroke Paternal Grandmother   . Uterine cancer Mother     No Known Allergies  Current Outpatient Prescriptions on File Prior to Visit  Medication Sig Dispense Refill  . aspirin 81 MG tablet Take 81 mg by mouth daily.        . hydrochlorothiazide (MICROZIDE) 12.5 MG capsule Take 1 capsule (12.5 mg total) by mouth daily.  30 capsule  6  . lovastatin (MEVACOR) 20 MG tablet Take 2 tablets (40 mg total) by mouth at bedtime.  180 tablet  1  . metoprolol (LOPRESSOR) 50 MG tablet TAKE ONE TABLET BY MOUTH TWICE DAILY  60 tablet  0  .  Multiple Vitamin (MULTIVITAMIN) tablet Take 1 tablet by mouth daily.         No current facility-administered medications on file prior to visit.    BP 110/70  Pulse 70  Temp(Src) 97.6 F (36.4 C) (Oral)  Resp 18  Ht 5' (1.524 m)  Wt 190 lb 0.6 oz (86.202 kg)  BMI 37.11 kg/m2  SpO2 96%       Objective:   Physical Exam  Physical Exam  Constitutional: She is oriented to person, place, and time. She appears well-developed and well-nourished. No distress.  HENT:  Head: Normocephalic and atraumatic.  Right Ear: Tympanic membrane and ear canal normal.  Left Ear: Tympanic membrane and ear canal normal.  Mouth/Throat: Oropharynx is clear and moist.  Eyes: Pupils are equal, round, and reactive to light. No scleral icterus.  Neck: Normal range of motion. No thyromegaly  present.  Cardiovascular: Normal rate and regular rhythm.   No murmur heard. Pulmonary/Chest: Effort normal and breath sounds normal. No respiratory distress. He has no wheezes. She has no rales. She exhibits no tenderness.  Abdominal: Soft. Bowel sounds are normal. He exhibits no distension and no mass. There is no tenderness. There is no rebound and no guarding.  Musculoskeletal: She exhibits no edema.  Lymphadenopathy:    She has no cervical adenopathy.  Neurological: She is alert and oriented to person, place, and time.  She exhibits normal muscle tone. Coordination normal.  Skin: Skin is warm and dry. No abnormal appearing moles noted on back Psychiatric: She has a normal mood and affect. Her behavior is normal. Judgment and thought content normal.  Breasts: Examined lying Right: Without masses, retractions, discharge or axillary adenopathy.  Left: Without masses, retractions, discharge or axillary adenopathy.  Inguinal/mons: Normal without inguinal adenopathy  External genitalia: Normal  BUS/Urethra/Skene's glands: Normal  Bladder: Normal  Vagina: Normal  Cervix: Normal  Uterus: normal in size, shape and contour. Midline and mobile  Adnexa/parametria:  Rt: Without masses or tenderness.  Lt: Without masses or tenderness.  Anus and perineum: Normal           Assessment & Plan:         Assessment & Plan:

## 2012-11-28 NOTE — Assessment & Plan Note (Addendum)
Pt counseled on healthy diet, exercise. Tdap today. Would like zostavax, but not sure if she has had chicken pox.  Will check varicella IgG. She is willing to pay cash today for varicella igG and LFT, but wishes to check coverage with her insurer re: coverage for additional laboratories.  Pap performed today, refer for mammogram and dexa.  She will obtain colo results and bring to our office for review.

## 2012-11-28 NOTE — Addendum Note (Signed)
Addended by: Mervin Kung A on: 11/28/2012 03:49 PM   Modules accepted: Orders

## 2012-11-28 NOTE — Patient Instructions (Addendum)
Please complete your lab work prior to leaving. Follow up in 6 months, sooner if problems/concerns.  

## 2012-11-29 ENCOUNTER — Encounter: Payer: Self-pay | Admitting: Family

## 2012-11-29 LAB — URINALYSIS, ROUTINE W REFLEX MICROSCOPIC

## 2012-11-29 LAB — BASIC METABOLIC PANEL WITH GFR
Calcium: 9.9 mg/dL (ref 8.4–10.5)
GFR, Est African American: 78 mL/min
GFR, Est Non African American: 68 mL/min
Sodium: 138 mEq/L (ref 135–145)

## 2012-11-29 LAB — TSH: TSH: 2.473 u[IU]/mL (ref 0.350–4.500)

## 2012-11-30 LAB — VARICELLA ZOSTER ANTIBODY, IGG: Varicella IgG: 833.9 Index — ABNORMAL HIGH (ref <135.00)

## 2012-12-01 ENCOUNTER — Telehealth: Payer: Self-pay | Admitting: *Deleted

## 2012-12-01 NOTE — Telephone Encounter (Signed)
Notified pt, she will return for injection tomorrow. Pt wants to hold off on urinalysis until she receives her statement for labs that have been completed. Also has cancelled DEXA as her "insurance will not cover DEXA until she is 63".

## 2012-12-01 NOTE — Telephone Encounter (Signed)
Message copied by Kathi Simpers on Thu Dec 01, 2012  2:43 PM ------      Message from: O'SULLIVAN, MELISSA      Created: Wed Nov 30, 2012  8:15 AM       Lab work shows that she has had chicken pox.  She can schedule zostavax at her convenience.  Blood count and thyroid function normal.  Lab did not complete urinalysis.  Would she please return to lab for urinalysis? ------

## 2012-12-02 ENCOUNTER — Ambulatory Visit (INDEPENDENT_AMBULATORY_CARE_PROVIDER_SITE_OTHER): Payer: BC Managed Care – PPO | Admitting: Family

## 2012-12-02 ENCOUNTER — Encounter: Payer: Self-pay | Admitting: Family

## 2012-12-02 ENCOUNTER — Inpatient Hospital Stay: Admission: RE | Admit: 2012-12-02 | Payer: BC Managed Care – PPO | Source: Ambulatory Visit

## 2012-12-02 DIAGNOSIS — Z2911 Encounter for prophylactic immunotherapy for respiratory syncytial virus (RSV): Secondary | ICD-10-CM

## 2012-12-02 DIAGNOSIS — Z23 Encounter for immunization: Secondary | ICD-10-CM

## 2012-12-02 MED ORDER — HYDROCHLOROTHIAZIDE 12.5 MG PO CAPS: 12.5000 mg | ORAL_CAPSULE | Freq: Every day | ORAL | Status: DC

## 2012-12-02 MED ORDER — METOPROLOL TARTRATE 50 MG PO TABS: ORAL_TABLET | ORAL | Status: DC

## 2012-12-02 MED ORDER — LOVASTATIN 20 MG PO TABS: 40.0000 mg | ORAL_TABLET | Freq: Every day | ORAL | Status: DC

## 2012-12-02 NOTE — Progress Notes (Signed)
Patient ID: [name], female   DOB: [birth year], 62 y.o.   MRN: [id number]  Pt here for nurse visit to obtain shingles vaccine. Requested written prescriptions for metoprolol, lovastatin and hctz. Rxs printed, signed by Provider and given to pt.

## 2012-12-06 ENCOUNTER — Telehealth: Payer: Self-pay | Admitting: Family

## 2012-12-06 DIAGNOSIS — Z86010 Personal history of colonic polyps: Secondary | ICD-10-CM

## 2012-12-06 NOTE — Telephone Encounter (Signed)
See mychart.  

## 2012-12-13 ENCOUNTER — Encounter: Payer: Self-pay | Admitting: Internal Medicine

## 2012-12-26 ENCOUNTER — Ambulatory Visit: Payer: Self-pay | Admitting: Family

## 2013-03-03 ENCOUNTER — Encounter: Payer: Self-pay | Admitting: Internal Medicine

## 2013-03-07 ENCOUNTER — Telehealth: Payer: Self-pay | Admitting: *Deleted

## 2013-03-07 NOTE — Telephone Encounter (Signed)
We have talked to her on the phone ( myself and Dotti). She will need a 2 day prep with Miralax and Movi prep.

## 2013-03-07 NOTE — Telephone Encounter (Signed)
I have spoken to patient and have advised that Dr Juanda Chance has reviewed her previous colonoscopy reports from 2009 and 2012 done by Dr Noe Gens. Per both of those reports, patient had an inadequate prep and was therefore due in 1 year for follow up. Patient is already scheduled for a colonoscopy. Per Dr Juanda Chance, patient should have double prep with miralax and moviprep.

## 2013-03-07 NOTE — Telephone Encounter (Signed)
Dr. Juanda Chance, This pt sent you an email about if she needed a colonoscopy at this time.  Her results from [facility] are scanned under the media tab in her chart.  Her last procedure was in 2012 for hx of polyps- her procedure in 04-05-13 and her PV is 03-09-13.  Is she ok to be done at this time? Also, if she is, she had an inadequate prep last time.  Do you want her to do a two day prep or just a regular prep? Thank you, Baxter Hire

## 2013-03-07 NOTE — Telephone Encounter (Signed)
Noted   2 day prep put in

## 2013-03-09 ENCOUNTER — Ambulatory Visit (AMBULATORY_SURGERY_CENTER): Payer: Self-pay | Admitting: *Deleted

## 2013-03-09 VITALS — Ht 61.0 in | Wt 178.4 lb

## 2013-03-09 DIAGNOSIS — Z86010 Personal history of colonic polyps: Secondary | ICD-10-CM

## 2013-03-09 MED ORDER — MOVIPREP 100 G PO SOLR: ORAL | Status: DC

## 2013-03-09 NOTE — Progress Notes (Signed)
No allergies to eggs or soy. No problems with anesthesia.  

## 2013-03-10 ENCOUNTER — Telehealth: Payer: Self-pay | Admitting: Internal Medicine

## 2013-03-10 ENCOUNTER — Encounter: Payer: Self-pay | Admitting: Internal Medicine

## 2013-03-10 NOTE — Telephone Encounter (Signed)
Pt cannot afford MoviPrep.  Will mail voucher to pt for free MoviPrep

## 2013-03-23 ENCOUNTER — Encounter: Payer: BC Managed Care – PPO | Admitting: Internal Medicine

## 2013-04-05 ENCOUNTER — Encounter: Payer: BC Managed Care – PPO | Admitting: Internal Medicine

## 2013-04-13 ENCOUNTER — Other Ambulatory Visit: Payer: Self-pay

## 2013-08-28 ENCOUNTER — Encounter: Payer: Self-pay | Admitting: Family Medicine

## 2013-08-28 ENCOUNTER — Ambulatory Visit (HOSPITAL_BASED_OUTPATIENT_CLINIC_OR_DEPARTMENT_OTHER)
Admission: RE | Admit: 2013-08-28 | Discharge: 2013-08-28 | Disposition: A | Discharge status: Home / Self Care | Payer: Medicare HMO | Source: Ambulatory Visit | Attending: Family Medicine | Admitting: Family Medicine

## 2013-08-28 ENCOUNTER — Ambulatory Visit (INDEPENDENT_AMBULATORY_CARE_PROVIDER_SITE_OTHER): Payer: Medicare HMO | Admitting: Family Medicine

## 2013-08-28 VITALS — BP 126/88 | HR 65 | Temp 98.2°F | Ht 60.0 in | Wt 178.0 lb

## 2013-08-28 DIAGNOSIS — Z860100 Personal history of colon polyps, unspecified: Secondary | ICD-10-CM

## 2013-08-28 DIAGNOSIS — F17200 Nicotine dependence, unspecified, uncomplicated: Secondary | ICD-10-CM

## 2013-08-28 DIAGNOSIS — Z23 Encounter for immunization: Secondary | ICD-10-CM

## 2013-08-28 DIAGNOSIS — I10 Essential (primary) hypertension: Secondary | ICD-10-CM

## 2013-08-28 DIAGNOSIS — Z780 Asymptomatic menopausal state: Secondary | ICD-10-CM

## 2013-08-28 DIAGNOSIS — R350 Frequency of micturition: Secondary | ICD-10-CM

## 2013-08-28 DIAGNOSIS — Z0000 Encounter for general adult medical examination without abnormal findings: Secondary | ICD-10-CM

## 2013-08-28 DIAGNOSIS — Z1389 Encounter for screening for other disorder: Secondary | ICD-10-CM | POA: Insufficient documentation

## 2013-08-28 DIAGNOSIS — Z87891 Personal history of nicotine dependence: Secondary | ICD-10-CM

## 2013-08-28 DIAGNOSIS — R109 Unspecified abdominal pain: Secondary | ICD-10-CM

## 2013-08-28 LAB — CBC
HCT: 38.2 % (ref 36.0–46.0)
Hemoglobin: 12.8 g/dL (ref 12.0–15.0)
MCH: 29.7 pg (ref 26.0–34.0)
MCHC: 33.5 g/dL (ref 30.0–36.0)
MCV: 88.6 fL (ref 78.0–100.0)
PLATELETS: 259 10*3/uL (ref 150–400)
RBC: 4.31 MIL/uL (ref 3.87–5.11)
RDW: 13.1 % (ref 11.5–15.5)
WBC: 5.1 10*3/uL (ref 4.0–10.5)

## 2013-08-28 LAB — HEPATIC FUNCTION PANEL
ALK PHOS: 53 U/L (ref 39–117)
ALT: 14 U/L (ref 0–35)
AST: 17 U/L (ref 0–37)
Albumin: 4.2 g/dL (ref 3.5–5.2)
BILIRUBIN INDIRECT: 0.3 mg/dL (ref 0.2–1.2)
Bilirubin, Direct: 0.1 mg/dL (ref 0.0–0.3)
Total Bilirubin: 0.4 mg/dL (ref 0.2–1.2)
Total Protein: 6.7 g/dL (ref 6.0–8.3)

## 2013-08-28 LAB — RENAL FUNCTION PANEL
Albumin: 4.2 g/dL (ref 3.5–5.2)
BUN: 19 mg/dL (ref 6–23)
CALCIUM: 9.4 mg/dL (ref 8.4–10.5)
CO2: 28 meq/L (ref 19–32)
Chloride: 100 mEq/L (ref 96–112)
Creat: 0.91 mg/dL (ref 0.50–1.10)
GLUCOSE: 101 mg/dL — AB (ref 70–99)
POTASSIUM: 4.2 meq/L (ref 3.5–5.3)
Phosphorus: 3.9 mg/dL (ref 2.3–4.6)
Sodium: 138 mEq/L (ref 135–145)

## 2013-08-28 LAB — LIPID PANEL
Cholesterol: 187 mg/dL (ref 0–200)
HDL: 46 mg/dL (ref >39)
LDL Cholesterol: 113 mg/dL — ABNORMAL HIGH (ref 0–99)
Total CHOL/HDL Ratio: 4.1 Ratio
Triglycerides: 141 mg/dL (ref <150)
VLDL: 28 mg/dL (ref 0–40)

## 2013-08-28 MED ORDER — OMEPRAZOLE 20 MG PO CPDR: 20.0000 mg | DELAYED_RELEASE_CAPSULE | Freq: Every day | ORAL | Status: DC | PRN

## 2013-08-28 MED ORDER — HYDROCHLOROTHIAZIDE 12.5 MG PO CAPS: 12.5000 mg | ORAL_CAPSULE | Freq: Every day | ORAL | Status: DC

## 2013-08-28 MED ORDER — METOPROLOL TARTRATE 50 MG PO TABS: ORAL_TABLET | ORAL | Status: DC

## 2013-08-28 MED ORDER — LOVASTATIN 20 MG PO TABS: 40.0000 mg | ORAL_TABLET | Freq: Every day | ORAL | Status: DC

## 2013-08-28 NOTE — Assessment & Plan Note (Signed)
Well controlled, no changes to meds. Encouraged heart healthy diet such as the DASH diet and exercise as tolerated.  °

## 2013-08-28 NOTE — Progress Notes (Signed)
Pre visit review using our clinic review tool, if applicable. No additional management support is needed unless otherwise documented below in the visit note. 

## 2013-08-28 NOTE — Patient Instructions (Signed)

## 2013-08-29 ENCOUNTER — Encounter: Payer: Self-pay | Admitting: Family Medicine

## 2013-08-29 ENCOUNTER — Telehealth: Payer: Self-pay

## 2013-08-29 ENCOUNTER — Ambulatory Visit (INDEPENDENT_AMBULATORY_CARE_PROVIDER_SITE_OTHER)
Admission: RE | Admit: 2013-08-29 | Discharge: 2013-08-29 | Disposition: A | Discharge status: Home / Self Care | Payer: Medicare HMO | Source: Ambulatory Visit | Attending: Family Medicine | Admitting: Family Medicine

## 2013-08-29 DIAGNOSIS — Z0000 Encounter for general adult medical examination without abnormal findings: Secondary | ICD-10-CM

## 2013-08-29 DIAGNOSIS — Z780 Asymptomatic menopausal state: Secondary | ICD-10-CM

## 2013-08-29 LAB — URINALYSIS
BILIRUBIN URINE: NEGATIVE
GLUCOSE, UA: NEGATIVE mg/dL
Hgb urine dipstick: NEGATIVE
Ketones, ur: NEGATIVE mg/dL
LEUKOCYTES UA: NEGATIVE
Nitrite: NEGATIVE
PH: 6.5 (ref 5.0–8.0)
PROTEIN: NEGATIVE mg/dL
SPECIFIC GRAVITY, URINE: 1.023 (ref 1.005–1.030)
Urobilinogen, UA: 0.2 mg/dL (ref 0.0–1.0)

## 2013-08-29 LAB — URINE CULTURE
Colony Count: NO GROWTH
Organism ID, Bacteria: NO GROWTH

## 2013-08-29 NOTE — Assessment & Plan Note (Signed)
Patient denies any difficulties at home. No trouble with ADLs, depression or falls. No recent changes to vision or hearing. Is UTD with immunizations. Is UTD with screening. Discussed Advanced Directives, patient agrees to bring [place] copies of documents if can. Encouraged heart healthy diet, exercise as tolerated and adequate sleep. AAA screen due to hi risk from Tobacco use over 20 years. Prevnar today.

## 2013-08-29 NOTE — Telephone Encounter (Signed)
Message copied by Varney Daily on Tue Aug 29, 2013  8:36 AM ------      Message from: Penni Homans A      Created: Tue Aug 29, 2013  8:16 AM       Please let her know I looked into Cologuard which we discussed in her visit, I could order it but my fear is will insurance pay for it since she had a colonoscopy in 2012 even though it was incomplete. It is an expensive test so her best bet is to go back to her previous gastroenterologist or I can refer her to a new one to discuss this testing. ------

## 2013-09-02 ENCOUNTER — Encounter: Payer: Self-pay | Admitting: Family Medicine

## 2013-09-02 DIAGNOSIS — Z860100 Personal history of colon polyps, unspecified: Secondary | ICD-10-CM

## 2013-09-02 DIAGNOSIS — Z87891 Personal history of nicotine dependence: Secondary | ICD-10-CM

## 2013-09-02 DIAGNOSIS — K219 Gastro-esophageal reflux disease without esophagitis: Secondary | ICD-10-CM

## 2013-09-02 DIAGNOSIS — Z780 Asymptomatic menopausal state: Secondary | ICD-10-CM | POA: Insufficient documentation

## 2013-09-02 DIAGNOSIS — R350 Frequency of micturition: Secondary | ICD-10-CM | POA: Insufficient documentation

## 2013-09-02 HISTORY — DX: Personal history of nicotine dependence: Z87.891

## 2013-09-02 HISTORY — DX: Gastro-esophageal reflux disease without esophagitis: K21.9

## 2013-09-02 HISTORY — DX: Personal history of colonic polyps: Z86.010

## 2013-09-02 HISTORY — DX: Personal history of colon polyps, unspecified: Z86.0100

## 2013-09-02 NOTE — Assessment & Plan Note (Signed)
UA C&S unremarkable, encouraged Kegel exercises bid and considers meds vs urologic referral if no improvement

## 2013-09-02 NOTE — Assessment & Plan Note (Addendum)
Avoid offending foods, start probiotics. Do not eat large meals in late evening and consider raising head of bed. Continue Prilosec. If symptoms do not improve may need referral to GI

## 2013-09-02 NOTE — Assessment & Plan Note (Signed)
Abdominal US of Aorta negative today

## 2013-09-02 NOTE — Assessment & Plan Note (Signed)
Bone density ordered, encouraged exercise as tolerated

## 2013-09-02 NOTE — Progress Notes (Signed)
Patient ID: EMSLEE Haynes, female   DOB: [birth year], 63 y.o.   MRN: [id number] [name] [id number] 17-Feb-1951 09/02/2013      Progress Note-Follow Up  Subjective  Chief Complaint  Chief Complaint  Patient presents with  . Annual Exam    medicare wellness  . Injections    prevnar    HPI  Patient is a 63 year old Caucasian female who is in today for routine medical care including welcome to Medicare visit. She reports she is generally doing well but acknowledges some concerns. Most notably she cuts them occasional lower abdominal pain for a couple of weeks. Describes some occasional abdominal pressure has been using Rolaids and Gas-X with some improvement. She denies fevers, chills, back pain. She has had some recent increase in urinary frequency, urgency and even some incontinence but no dysuria or hematuria. He gets up roughly 3-6 times a night to urinate. Continues to move her bowels daily without difficulty. No change in bowel size, consistency or color. No black or tarry stool. He is a previous smoker but she quit in 1984 after 20 habit. Denies CP/palp/SOB/HA/congestion/fevers or GU c/o. Taking meds as prescribed  Past Medical History  Diagnosis Date  . History  of basal cell carcinoma   . History of cardiac arrhythmia   . Arthritis   . Glaucoma   . Hyperlipidemia   . Essential hypertension, benign   . Glucose intolerance (impaired glucose tolerance)   . Depression   . Medicare welcome visit 11/28/2012    Past Surgical History  Procedure Laterality Date  . Dilation and curettage of uterus  1966  . Knee surgery Left 11/01/2008    left knee total replacement Dr Sharol Given    Family History  Problem Relation Age of Onset  . Coronary artery disease Paternal Grandmother   . Hyperlipidemia Paternal Grandmother   . Hypertension Paternal Grandmother   . Stroke Paternal Grandmother   . Hyperlipidemia    . Hypertension Mother   . Lung cancer Mother     smoker  . Uterine cancer  Mother   . Birth defects Mother     lung cancer in smoker  . COPD Mother   . Hypertension Father   . Kidney disease Father     Renal Cell Carcinoma  . Birth defects Father     renal cell CA, smoker  . Hypertension Maternal Grandmother   . Hypertension Maternal Grandfather   . Hypertension Paternal Grandfather   . Dementia Paternal Grandfather   . Colon cancer Neg Hx   . Cancer Brother     testicular  . Heart disease Brother     NI, smoker    History   Social History  . Marital Status: Single    Spouse Name: N/A    Number of Children: N/A  . Years of Education: N/A   Occupational History  . Not on file.   Social History Main Topics  . Smoking status: Former Smoker    Types: Cigarettes    Quit date: 06/08/1982  . Smokeless tobacco: Never Used  . Alcohol Use: 0.0 oz/week     Comment: 1 beverage every couple  . Drug Use: No  . Sexual Activity: No     Comment: lives alone, no dietary restrictions except no lactose,   Other Topics Concern  . Not on file   Social History Narrative   Occupation: unemployed Product manager- now retired   Divorced   no children    Moved from [place]  Lives alone with 4 cats   Enjoys gardening, planting.              Current Outpatient Prescriptions on File Prior to Visit  Medication Sig Dispense Refill  . aspirin 81 MG tablet Take 81 mg by mouth daily.        . fish oil-omega-3 fatty acids 1000 MG capsule Take 2 g by mouth 2 (two) times daily.      Marland Kitchen ibuprofen (ADVIL,MOTRIN) 200 MG tablet Take 200 mg by mouth every 6 (six) hours as needed for pain.      . Multiple Vitamin (MULTIVITAMIN) tablet Take 1 tablet by mouth daily.        Marland Kitchen MOVIPREP 100 G SOLR moviprep as directed. No substitutions  1 kit  0   No current facility-administered medications on file prior to visit.    No Known Allergies  Review of Systems  Review of Systems  Constitutional: Negative for fever, chills and malaise/fatigue.  HENT: Negative for  congestion, hearing loss and nosebleeds.   Eyes: Negative for discharge.  Respiratory: Negative for cough, sputum production, shortness of breath and wheezing.   Cardiovascular: Negative for chest pain, palpitations and leg swelling.  Gastrointestinal: Positive for heartburn, abdominal pain and blood in stool. Negative for nausea, vomiting, diarrhea and constipation.  Genitourinary: Positive for urgency. Negative for dysuria, frequency and hematuria.  Musculoskeletal: Negative for back pain, falls and myalgias.  Skin: Negative for rash.  Neurological: Negative for dizziness, tremors, sensory change, focal weakness, loss of consciousness, weakness and headaches.  Endo/Heme/Allergies: Negative for polydipsia. Does not bruise/bleed easily.  Psychiatric/Behavioral: Negative for depression and suicidal ideas. The patient is not nervous/anxious and does not have insomnia.     Objective  BP 126/88  Pulse 65  Temp(Src) 98.2 F (36.8 C) (Oral)  Ht 5' (1.524 m)  Wt 178 lb 0.6 oz (80.758 kg)  BMI 34.77 kg/m2  SpO2 95%  Physical Exam  Physical Exam  Constitutional: She is oriented to person, place, and time and well-developed, well-nourished, and in no distress. No distress.  HENT:  Head: Normocephalic and atraumatic.  Right Ear: External ear normal.  Left Ear: External ear normal.  Nose: Nose normal.  Mouth/Throat: Oropharynx is clear and moist. No oropharyngeal exudate.  Eyes: Conjunctivae are normal. Pupils are equal, round, and reactive to light. Right eye exhibits no discharge. Left eye exhibits no discharge. No scleral icterus.  Neck: Normal range of motion. Neck supple. No thyromegaly present.  Cardiovascular: Normal rate, regular rhythm, normal heart sounds and intact distal pulses.   Pulmonary/Chest: Effort normal and breath sounds normal. No respiratory distress. She has no wheezes. She has no rales.  Abdominal: Soft. Bowel sounds are normal. She exhibits no distension and no  mass. There is no tenderness.  Musculoskeletal: Normal range of motion. She exhibits no edema and no tenderness.  Lymphadenopathy:    She has no cervical adenopathy.  Neurological: She is alert and oriented to person, place, and time. She has normal reflexes. No cranial nerve deficit. Coordination normal.  Skin: Skin is warm and dry. No rash noted. She is not diaphoretic.  Psychiatric: Mood, memory and affect normal.    Lab Results  Component Value Date   TSH 2.473 11/28/2012   Lab Results  Component Value Date   WBC 5.1 08/28/2013   HGB 12.8 08/28/2013   HCT 38.2 08/28/2013   MCV 88.6 08/28/2013   PLT 259 08/28/2013   Lab Results  Component Value Date  CREATININE 0.91 08/28/2013   BUN 19 08/28/2013   NA 138 08/28/2013   K 4.2 08/28/2013   CL 100 08/28/2013   CO2 28 08/28/2013   Lab Results  Component Value Date   ALT 14 08/28/2013   AST 17 08/28/2013   ALKPHOS 53 08/28/2013   BILITOT 0.4 08/28/2013   Lab Results  Component Value Date   CHOL 187 08/28/2013   Lab Results  Component Value Date   HDL 46 08/28/2013   Lab Results  Component Value Date   LDLCALC 113* 08/28/2013   Lab Results  Component Value Date   TRIG 141 08/28/2013   Lab Results  Component Value Date   CHOLHDL 4.1 08/28/2013     Assessment & Plan  ESSENTIAL HYPERTENSION, BENIGN Well controlled, no changes to meds. Encouraged heart healthy diet such as the DASH diet and exercise as tolerated.   Medicare welcome visit Patient denies any difficulties at home. No trouble with ADLs, depression or falls. No recent changes to vision or hearing. Is UTD with immunizations. Is UTD with screening. Discussed Advanced Directives, patient agrees to bring [place] copies of documents if can. Encouraged heart healthy diet, exercise as tolerated and adequate sleep. AAA screen due to hi risk from Tobacco use over 20 years. Prevnar today.   Post-menopause Bone density ordered, encouraged exercise as tolerated  Urinary  frequency UA C&S unremarkable, encouraged Kegel exercises bid and considers meds vs urologic referral if no improvement  H/O tobacco use, presenting hazards to health Abdominal US of Aorta negative today  GERD (gastroesophageal reflux disease) Avoid offending foods, start probiotics. Do not eat large meals in late evening and consider raising head of bed. Continue Prilosec. If symptoms do not improve may need referral to GI

## 2013-11-14 ENCOUNTER — Encounter: Payer: Self-pay | Admitting: Family Medicine

## 2013-11-14 ENCOUNTER — Ambulatory Visit (INDEPENDENT_AMBULATORY_CARE_PROVIDER_SITE_OTHER): Payer: Medicare HMO | Admitting: Family Medicine

## 2013-11-14 VITALS — BP 118/82 | HR 64 | Temp 98.4°F | Ht 60.0 in | Wt 180.0 lb

## 2013-11-14 DIAGNOSIS — K219 Gastro-esophageal reflux disease without esophagitis: Secondary | ICD-10-CM

## 2013-11-14 DIAGNOSIS — I10 Essential (primary) hypertension: Secondary | ICD-10-CM

## 2013-11-14 DIAGNOSIS — H8110 Benign paroxysmal vertigo, unspecified ear: Secondary | ICD-10-CM

## 2013-11-14 DIAGNOSIS — E785 Hyperlipidemia, unspecified: Secondary | ICD-10-CM

## 2013-11-14 HISTORY — DX: Benign paroxysmal vertigo, unspecified ear: H81.10

## 2013-11-14 NOTE — Patient Instructions (Signed)
Nasal saline flushes, Claritin or zyrtec daily  Call if any concerns   Vertigo Vertigo means you feel like you or your surroundings are moving when they are not. Vertigo can be dangerous if it occurs when you are at work, driving, or performing difficult activities.  CAUSES  Vertigo occurs when there is a conflict of signals sent to your brain from the visual and sensory systems in your body. There are many different causes of vertigo, including:  Infections, especially in the inner ear.  A bad reaction to a drug or misuse of alcohol and medicines.  Withdrawal from drugs or alcohol.  Rapidly changing positions, such as lying down or rolling over in bed.  A migraine headache.  Decreased blood flow to the brain.  Increased pressure in the brain from a head injury, infection, tumor, or bleeding. SYMPTOMS  You may feel as though the world is spinning around or you are falling to the ground. Because your balance is upset, vertigo can cause nausea and vomiting. You may have involuntary eye movements (nystagmus). DIAGNOSIS  Vertigo is usually diagnosed by physical exam. If the cause of your vertigo is unknown, your caregiver may perform imaging tests, such as an MRI scan (magnetic resonance imaging). TREATMENT  Most cases of vertigo resolve on their own, without treatment. Depending on the cause, your caregiver may prescribe certain medicines. If your vertigo is related to body position issues, your caregiver may recommend movements or procedures to correct the problem. In rare cases, if your vertigo is caused by certain inner ear problems, you may need surgery. HOME CARE INSTRUCTIONS   Follow your caregiver's instructions.  Avoid driving.  Avoid operating heavy machinery.  Avoid performing any tasks that would be dangerous to you or others during a vertigo episode.  Tell your caregiver if you notice that certain medicines seem to be causing your vertigo. Some of the medicines used to  treat vertigo episodes can actually make them worse in some people. SEEK IMMEDIATE MEDICAL CARE IF:   Your medicines do not relieve your vertigo or are making it worse.  You develop problems with talking, walking, weakness, or using your arms, hands, or legs.  You develop severe headaches.  Your nausea or vomiting continues or gets worse.  You develop visual changes.  A family member notices behavioral changes.  Your condition gets worse. MAKE SURE YOU:  Understand these instructions.  Will watch your condition.  Will get help right away if you are not doing well or get worse. Document Released: 03/04/2005 Document Revised: 08/17/2011 Document Reviewed: 12/11/2010 [facility] Patient Information 2014 [employer].

## 2013-11-14 NOTE — Progress Notes (Signed)
Pre visit review using our clinic review tool, if applicable. No additional management support is needed unless otherwise documented below in the visit note. 

## 2013-11-22 NOTE — Progress Notes (Signed)
Patient ID: [name], female   DOB: Sep 08, 1950, 63 y.o.   MRN: [id number] [name] [id number] [birth year] 11/22/2013      Progress Note-Follow Up  Subjective  Chief Complaint  Chief Complaint  Patient presents with  . Follow-up    3 month    HPI  Patient is a 63 year old female in today for routine medical care. Here today for followup. Is generally doing well a sinus infection medication in May. Did not responde well to antibiotics but symptoms resolved in about a month. She has been left with some symptoms of vertigo. She's had trouble with vertigo off and on for years but his symptoms were more persistent this time. Symptoms are better today. No ear pain or tinnitus. No other neurologic complaints or hearing loss. No headaches. Denies CP/palp/SOB/HA/congestion/fevers/GI or GU c/o. Taking meds as prescribed  Past Medical History  Diagnosis Date  . History  of basal cell carcinoma   . History of cardiac arrhythmia   . Arthritis   . Glaucoma   . Hyperlipidemia   . Essential hypertension, benign   . Glucose intolerance (impaired glucose tolerance)   . Depression   . Medicare welcome visit 11/28/2012  . H/O tobacco use, presenting hazards to health 09/02/2013  . GERD (gastroesophageal reflux disease) 09/02/2013  . Personal history of colonic polyps 09/02/2013    Patient reports H/O 2 colonoscopies, last one dates back to 2012 in [place] She reports they were labeled as incomplete.  Reports 1st colonoscopy had 1 benign polyp and the 2nd was clear.   . Benign paroxysmal positional vertigo 11/14/2013    Past Surgical History  Procedure Laterality Date  . Dilation and curettage of uterus  1966  . Knee surgery Left 11/01/2008    left knee total replacement Dr Sharol Given    Family History  Problem Relation Age of Onset  . Coronary artery disease Paternal Grandmother   . Hyperlipidemia Paternal Grandmother   . Hypertension Paternal Grandmother   . Stroke Paternal Grandmother   .  Hyperlipidemia    . Hypertension Mother   . Lung cancer Mother     smoker  . Uterine cancer Mother   . Birth defects Mother     lung cancer in smoker  . COPD Mother   . Hypertension Father   . Kidney disease Father     Renal Cell Carcinoma  . Birth defects Father     renal cell CA, smoker  . Hypertension Maternal Grandmother   . Hypertension Maternal Grandfather   . Hypertension Paternal Grandfather   . Dementia Paternal Grandfather   . Colon cancer Neg Hx   . Cancer Brother     testicular  . Heart disease Brother     NI, smoker    History   Social History  . Marital Status: Single    Spouse Name: N/A    Number of Children: N/A  . Years of Education: N/A   Occupational History  . Not on file.   Social History Main Topics  . Smoking status: Former Smoker    Types: Cigarettes    Quit date: 06/08/1982  . Smokeless tobacco: Never Used  . Alcohol Use: 0.0 oz/week     Comment: 1 beverage every couple  . Drug Use: No  . Sexual Activity: No     Comment: lives alone, no dietary restrictions except no lactose,   Other Topics Concern  . Not on file   Social History Narrative   Occupation:  unemployed Product manager- now retired   Divorced   no children    Moved from [place] alone with 4 cats   Enjoys gardening, planting.              Current Outpatient Prescriptions on File Prior to Visit  Medication Sig Dispense Refill  . aspirin 81 MG tablet Take 81 mg by mouth daily.        . fish oil-omega-3 fatty acids 1000 MG capsule Take 2 g by mouth 2 (two) times daily.      . hydrochlorothiazide (MICROZIDE) 12.5 MG capsule Take 1 capsule (12.5 mg total) by mouth daily.  90 capsule  1  . ibuprofen (ADVIL,MOTRIN) 200 MG tablet Take 200 mg by mouth every 6 (six) hours as needed for pain.      Marland Kitchen lovastatin (MEVACOR) 20 MG tablet Take 2 tablets (40 mg total) by mouth at bedtime.  180 tablet  1  . metoprolol (LOPRESSOR) 50 MG tablet TAKE ONE TABLET BY MOUTH  TWICE DAILY  180 tablet  1  . Multiple Vitamin (MULTIVITAMIN) tablet Take 1 tablet by mouth daily.        Marland Kitchen omeprazole (PRILOSEC) 20 MG capsule Take 1 capsule (20 mg total) by mouth daily as needed.  90 capsule  1   No current facility-administered medications on file prior to visit.    No Known Allergies  Review of Systems  Review of Systems  Constitutional: Negative for fever and malaise/fatigue.  HENT: Negative for congestion.   Eyes: Negative for discharge.  Respiratory: Negative for shortness of breath.   Cardiovascular: Negative for chest pain, palpitations and leg swelling.  Gastrointestinal: Negative for nausea, abdominal pain and diarrhea.  Genitourinary: Negative for dysuria.  Musculoskeletal: Negative for falls.  Skin: Negative for rash.  Neurological: Positive for dizziness. Negative for loss of consciousness and headaches.  Endo/Heme/Allergies: Negative for polydipsia.  Psychiatric/Behavioral: Negative for depression and suicidal ideas. The patient is not nervous/anxious and does not have insomnia.     Objective  BP 118/82  Pulse 64  Temp(Src) 98.4 F (36.9 C) (Oral)  Ht 5' (1.524 m)  Wt 180 lb (81.647 kg)  BMI 35.15 kg/m2  SpO2 94%  Physical Exam  Physical Exam  Constitutional: She is oriented to person, place, and time and well-developed, well-nourished, and in no distress. No distress.  HENT:  Head: Normocephalic and atraumatic.  Eyes: Conjunctivae are normal.  Neck: Neck supple. No thyromegaly present.  Cardiovascular: Normal rate, regular rhythm and normal heart sounds.   No murmur heard. Pulmonary/Chest: Effort normal and breath sounds normal. She has no wheezes.  Abdominal: She exhibits no distension and no mass.  Musculoskeletal: She exhibits no edema.  Lymphadenopathy:    She has no cervical adenopathy.  Neurological: She is alert and oriented to person, place, and time.  Skin: Skin is warm and dry. No rash noted. She is not diaphoretic.   Psychiatric: Memory, affect and judgment normal.    Lab Results  Component Value Date   TSH 2.473 11/28/2012   Lab Results  Component Value Date   WBC 5.1 08/28/2013   HGB 12.8 08/28/2013   HCT 38.2 08/28/2013   MCV 88.6 08/28/2013   PLT 259 08/28/2013   Lab Results  Component Value Date   CREATININE 0.91 08/28/2013   BUN 19 08/28/2013   NA 138 08/28/2013   K 4.2 08/28/2013   CL 100 08/28/2013   CO2 28 08/28/2013   Lab Results  Component Value Date   ALT 14 08/28/2013   AST 17 08/28/2013   ALKPHOS 53 08/28/2013   BILITOT 0.4 08/28/2013   Lab Results  Component Value Date   CHOL 187 08/28/2013   Lab Results  Component Value Date   HDL 46 08/28/2013   Lab Results  Component Value Date   LDLCALC 113* 08/28/2013   Lab Results  Component Value Date   TRIG 141 08/28/2013   Lab Results  Component Value Date   CHOLHDL 4.1 08/28/2013     Assessment & Plan  ESSENTIAL HYPERTENSION, BENIGN Well controlled, no changes to meds. Encouraged heart healthy diet such as the DASH diet and exercise as tolerated.   HYPERLIPIDEMIA Tolerating statin, encouraged heart healthy diet, avoid trans fats, minimize simple carbs and saturated fats. Increase exercise as tolerated  GERD (gastroesophageal reflux disease) Avoid offending foods, start probiotics. Do not eat large meals in late evening and consider raising head of bed.   Benign paroxysmal positional vertigo Patient reports history of intermittent vertigo worse recently, encouraged increased hydration and will consider Vertigo if returns. Is improved somewhat today no other neurologic complaints

## 2013-11-22 NOTE — Assessment & Plan Note (Signed)
Tolerating statin, encouraged heart healthy diet, avoid trans fats, minimize simple carbs and saturated fats. Increase exercise as tolerated 

## 2013-11-22 NOTE — Assessment & Plan Note (Signed)
Patient reports history of intermittent vertigo worse recently, encouraged increased hydration and will consider Vertigo if returns. Is improved somewhat today no other neurologic complaints

## 2013-11-22 NOTE — Assessment & Plan Note (Signed)
Avoid offending foods, start probiotics. Do not eat large meals in late evening and consider raising head of bed.  

## 2013-11-22 NOTE — Assessment & Plan Note (Signed)
Well controlled, no changes to meds. Encouraged heart healthy diet such as the DASH diet and exercise as tolerated.  °

## 2014-01-21 ENCOUNTER — Encounter: Payer: Self-pay | Admitting: Family Medicine

## 2014-01-21 DIAGNOSIS — R109 Unspecified abdominal pain: Secondary | ICD-10-CM

## 2014-01-22 NOTE — Telephone Encounter (Signed)
Patient presented to office with abdominal pain and history of UTI order a urinalysis, urine culture

## 2014-01-23 LAB — URINALYSIS
Bilirubin Urine: NEGATIVE
GLUCOSE, UA: NEGATIVE mg/dL
HGB URINE DIPSTICK: NEGATIVE
Ketones, ur: NEGATIVE mg/dL
Leukocytes, UA: NEGATIVE
Nitrite: NEGATIVE
Protein, ur: NEGATIVE mg/dL
Specific Gravity, Urine: 1.005 (ref 1.005–1.030)
Urobilinogen, UA: 0.2 mg/dL (ref 0.0–1.0)
pH: 6 (ref 5.0–8.0)

## 2014-01-23 LAB — URINE CULTURE
COLONY COUNT: NO GROWTH
Organism ID, Bacteria: NO GROWTH

## 2014-01-26 ENCOUNTER — Ambulatory Visit (INDEPENDENT_AMBULATORY_CARE_PROVIDER_SITE_OTHER): Payer: Medicare HMO | Admitting: Physician Assistant

## 2014-01-26 ENCOUNTER — Encounter: Payer: Self-pay | Admitting: Physician Assistant

## 2014-01-26 ENCOUNTER — Telehealth: Payer: Self-pay

## 2014-01-26 VITALS — BP 124/82 | HR 69 | Temp 97.9°F | Resp 16 | Ht 60.0 in | Wt 178.2 lb

## 2014-01-26 DIAGNOSIS — K5732 Diverticulitis of large intestine without perforation or abscess without bleeding: Secondary | ICD-10-CM

## 2014-01-26 DIAGNOSIS — B369 Superficial mycosis, unspecified: Secondary | ICD-10-CM | POA: Insufficient documentation

## 2014-01-26 DIAGNOSIS — K5790 Diverticulosis of intestine, part unspecified, without perforation or abscess without bleeding: Secondary | ICD-10-CM | POA: Insufficient documentation

## 2014-01-26 MED ORDER — CIPROFLOXACIN HCL 500 MG PO TABS: 500.0000 mg | ORAL_TABLET | Freq: Two times a day (BID) | ORAL | Status: DC

## 2014-01-26 MED ORDER — CLOTRIMAZOLE-BETAMETHASONE 1-0.05 % EX CREA: 1.0000 "application " | TOPICAL_CREAM | Freq: Two times a day (BID) | CUTANEOUS | Status: DC

## 2014-01-26 MED ORDER — METRONIDAZOLE 500 MG PO TABS: 500.0000 mg | ORAL_TABLET | Freq: Three times a day (TID) | ORAL | Status: DC

## 2014-01-26 NOTE — Patient Instructions (Signed)
For rash -- use prescription cream twice daily for 1-2 weeks.  Keep area clean and dry.  Report any worsening symptoms.  For pain -- limits foods with seeds, husks or nuts.  Keep good fiber intake.  Stay well hydrated.  Take antibiotics as directed.  Take a daily probiotic.  Obtain labs.  I will call you with your results.  Speak with Delsa Sale at the front desk to schedule your CT scan.  I will call you with these results.  If symptoms worsen over the weekend, please proceed to an Urgent Care or ER.

## 2014-01-26 NOTE — Telephone Encounter (Signed)
[name] stated that"Lotrisone Cream is Rx'd for #30 but #45 is available and needs authorization to ok it." LDM

## 2014-01-26 NOTE — Progress Notes (Signed)
Patient presents to clinic today c/o Intermittent LLQ pain x 1 year.  Most recent flare up of symptoms began this previous Friday.  Endorses LLQ pain with some nausea, loose stool and fatigue. Denies emesis.  Had a fever at onset of symptoms that has since resolved.  Denies tenesmus, melena or hematochezia.  Last Colonoscopy in 2012.  Patient endorses was normal.  Denies known history of diverticular disease.  Denies dysuria, urinary urgency or frequency.  Has family history of Ovarian Cancer and has upcoming appointment with her Gynecologist.  Past Medical History  Diagnosis Date  . History  of basal cell carcinoma   . History of cardiac arrhythmia   . Arthritis   . Glaucoma   . Hyperlipidemia   . Essential hypertension, benign   . Glucose intolerance (impaired glucose tolerance)   . Depression   . Medicare welcome visit 11/28/2012  . H/O tobacco use, presenting hazards to health 09/02/2013  . GERD (gastroesophageal reflux disease) 09/02/2013  . Personal history of colonic polyps 09/02/2013    Patient reports H/O 2 colonoscopies, last one dates back to 2012 in [place] She reports they were labeled as incomplete.  Reports 1st colonoscopy had 1 benign polyp and the 2nd was clear.   . Benign paroxysmal positional vertigo 11/14/2013    Current Outpatient Prescriptions on File Prior to Visit  Medication Sig Dispense Refill  . aspirin 81 MG tablet Take 81 mg by mouth daily.        . fish oil-omega-3 fatty acids 1000 MG capsule Take 2 g by mouth 2 (two) times daily.      . hydrochlorothiazide (MICROZIDE) 12.5 MG capsule Take 1 capsule (12.5 mg total) by mouth daily.  90 capsule  1  . ibuprofen (ADVIL,MOTRIN) 200 MG tablet Take 200 mg by mouth every 6 (six) hours as needed for pain.      Marland Kitchen lovastatin (MEVACOR) 20 MG tablet Take 2 tablets (40 mg total) by mouth at bedtime.  180 tablet  1  . magnesium oxide (MAG-OX) 400 MG tablet Take 400 mg by mouth daily.      . metoprolol (LOPRESSOR) 50 MG tablet TAKE ONE  TABLET BY MOUTH TWICE DAILY  180 tablet  1  . Multiple Vitamin (MULTIVITAMIN) tablet Take 1 tablet by mouth daily.        Marland Kitchen omeprazole (PRILOSEC) 20 MG capsule Take 1 capsule (20 mg total) by mouth daily as needed.  90 capsule  1   No current facility-administered medications on file prior to visit.    No Known Allergies  Family History  Problem Relation Age of Onset  . Coronary artery disease Paternal Grandmother   . Hyperlipidemia Paternal Grandmother   . Hypertension Paternal Grandmother   . Stroke Paternal Grandmother   . Hyperlipidemia    . Hypertension Mother   . Lung cancer Mother     smoker  . Uterine cancer Mother   . Birth defects Mother     lung cancer in smoker  . COPD Mother   . Hypertension Father   . Kidney disease Father     Renal Cell Carcinoma  . Birth defects Father     renal cell CA, smoker  . Hypertension Maternal Grandmother   . Hypertension Maternal Grandfather   . Hypertension Paternal Grandfather   . Dementia Paternal Grandfather   . Colon cancer Neg Hx   . Cancer Brother     testicular  . Heart disease Brother     NI, smoker  History   Social History  . Marital Status: Single    Spouse Name: N/A    Number of Children: N/A  . Years of Education: N/A   Social History Main Topics  . Smoking status: Former Smoker    Types: Cigarettes    Quit date: 06/08/1982  . Smokeless tobacco: Never Used  . Alcohol Use: 0.0 oz/week     Comment: 1 beverage every couple  . Drug Use: No  . Sexual Activity: No     Comment: lives alone, no dietary restrictions except no lactose,   Other Topics Concern  . None   Social History Narrative   Occupation: unemployed Product manager- now retired   Divorced   no children    Moved from [place] alone with 4 cats   Enjoys gardening, planting.             Review of Systems - See HPI.  All other ROS are negative.  BP 124/82  Pulse 69  Temp(Src) 97.9 F (36.6 C) (Oral)  Resp 16  Ht  5' (1.524 m)  Wt 178 lb 4 oz (80.854 kg)  BMI 34.81 kg/m2  SpO2 98%  Physical Exam  Vitals reviewed. Constitutional: She is oriented to person, place, and time and well-developed, well-nourished, and in no distress.  HENT:  Head: Normocephalic and atraumatic.  Eyes: Conjunctivae are normal.  Neck: Neck supple.  Cardiovascular: Normal rate, regular rhythm, normal heart sounds and intact distal pulses.   Pulmonary/Chest: Effort normal and breath sounds normal. No respiratory distress. She has no wheezes. She has no rales. She exhibits no tenderness.  Abdominal: Soft. Bowel sounds are normal. She exhibits no distension and no mass. There is no rebound and no guarding.  + LLQ tenderness with deep palpation without rebound or guarding.  Neurological: She is alert and oriented to person, place, and time.  Skin: Skin is warm and dry.  Presence of a scaly, erythematous rash of lower abdomen, underneath pannus.  No evidence of excoriation or superimposed infection.  Rash crosses the midline.  Psychiatric: Affect normal.    Recent Results (from the past 2160 hour(s))  URINALYSIS     Status: None   Collection Time    01/22/14 10:17 AM      Result Value Ref Range   Color, Urine YELLOW  YELLOW   APPearance CLEAR  CLEAR   Specific Gravity, Urine 1.005  1.005 - 1.030   pH 6.0  5.0 - 8.0   Glucose, UA NEG  NEG mg/dL   Bilirubin Urine NEG  NEG   Ketones, ur NEG  NEG mg/dL   Hgb urine dipstick NEG  NEG   Protein, ur NEG  NEG mg/dL   Urobilinogen, UA 0.2  0.0 - 1.0 mg/dL   Nitrite NEG  NEG   Leukocytes, UA NEG  NEG  URINE CULTURE     Status: None   Collection Time    01/22/14 10:20 AM      Result Value Ref Range   Colony Count NO GROWTH     Organism ID, Bacteria NO GROWTH    CBC     Status: None   Collection Time    01/26/14  2:38 PM      Result Value Ref Range   WBC 7.7  4.0 - 10.5 K/uL   RBC 4.28  3.87 - 5.11 MIL/uL   Hemoglobin 12.9  12.0 - 15.0 g/dL   HCT 38.1  36.0 - 46.0 %  MCV 89.0  78.0 - 100.0 fL   MCH 30.1  26.0 - 34.0 pg   MCHC 33.9  30.0 - 36.0 g/dL   RDW 13.1  11.5 - 15.5 %   Platelets 338  150 - 400 K/uL  COMPREHENSIVE METABOLIC PANEL     Status: Abnormal   Collection Time    01/26/14  2:38 PM      Result Value Ref Range   Sodium 137  135 - 145 mEq/L   Potassium 4.0  3.5 - 5.3 mEq/L   Chloride 99  96 - 112 mEq/L   CO2 28  19 - 32 mEq/L   Glucose, Bld 141 (*) 70 - 99 mg/dL   BUN 20  6 - 23 mg/dL   Creat 1.00  0.50 - 1.10 mg/dL   Total Bilirubin 0.3  0.2 - 1.2 mg/dL   Alkaline Phosphatase 55  39 - 117 U/L   AST 15  0 - 37 U/L   ALT 11  0 - 35 U/L   Total Protein 7.1  6.0 - 8.3 g/dL   Albumin 4.3  3.5 - 5.2 g/dL   Calcium 9.7  8.4 - 10.5 mg/dL    Assessment/Plan: Diverticulitis of colon (without mention of hemorrhage) Concern for symptomatic diverticular disease.  Improved according to patient but still with tenderness and bowel changes.  Will obtain CBC, BMP.  Will obtain CT Abdomen to further evaluate.  Rx Cipro BID x 7 days and Flagyl 500  Fungal dermatitis Rx Lotrisone cream. Keep area clean and patted dry.

## 2014-01-26 NOTE — Progress Notes (Signed)
Pre visit review using our clinic review tool, if applicable. No additional management support is needed unless otherwise documented below in the visit note/SLS  

## 2014-01-26 NOTE — Assessment & Plan Note (Signed)
Concern for symptomatic diverticular disease.  Improved according to patient but still with tenderness and bowel changes.  Will obtain CBC, BMP.  Will obtain CT Abdomen to further evaluate.  Rx Cipro BID x 7 days and Flagyl 500

## 2014-01-27 LAB — CBC
HCT: 38.1 % (ref 36.0–46.0)
Hemoglobin: 12.9 g/dL (ref 12.0–15.0)
MCH: 30.1 pg (ref 26.0–34.0)
MCHC: 33.9 g/dL (ref 30.0–36.0)
MCV: 89 fL (ref 78.0–100.0)
Platelets: 338 10*3/uL (ref 150–400)
RBC: 4.28 MIL/uL (ref 3.87–5.11)
RDW: 13.1 % (ref 11.5–15.5)
WBC: 7.7 10*3/uL (ref 4.0–10.5)

## 2014-01-27 LAB — COMPREHENSIVE METABOLIC PANEL
ALT: 11 U/L (ref 0–35)
AST: 15 U/L (ref 0–37)
Albumin: 4.3 g/dL (ref 3.5–5.2)
Alkaline Phosphatase: 55 U/L (ref 39–117)
BUN: 20 mg/dL (ref 6–23)
CALCIUM: 9.7 mg/dL (ref 8.4–10.5)
CHLORIDE: 99 meq/L (ref 96–112)
CO2: 28 meq/L (ref 19–32)
Creat: 1 mg/dL (ref 0.50–1.10)
Glucose, Bld: 141 mg/dL — ABNORMAL HIGH (ref 70–99)
Potassium: 4 mEq/L (ref 3.5–5.3)
Sodium: 137 mEq/L (ref 135–145)
Total Bilirubin: 0.3 mg/dL (ref 0.2–1.2)
Total Protein: 7.1 g/dL (ref 6.0–8.3)

## 2014-01-27 NOTE — Telephone Encounter (Signed)
OK to change the quantity of Lotrisone to 45 g

## 2014-01-28 ENCOUNTER — Encounter: Payer: Self-pay | Admitting: Physician Assistant

## 2014-01-29 ENCOUNTER — Ambulatory Visit (HOSPITAL_BASED_OUTPATIENT_CLINIC_OR_DEPARTMENT_OTHER)
Admission: RE | Admit: 2014-01-29 | Discharge: 2014-01-29 | Disposition: A | Discharge status: Home / Self Care | Payer: Medicare HMO | Source: Ambulatory Visit | Attending: Physician Assistant | Admitting: Physician Assistant

## 2014-01-29 ENCOUNTER — Encounter (HOSPITAL_BASED_OUTPATIENT_CLINIC_OR_DEPARTMENT_OTHER): Payer: Self-pay

## 2014-01-29 ENCOUNTER — Ambulatory Visit: Payer: Medicare HMO | Admitting: Physician Assistant

## 2014-01-29 ENCOUNTER — Other Ambulatory Visit (HOSPITAL_BASED_OUTPATIENT_CLINIC_OR_DEPARTMENT_OTHER): Payer: Medicare HMO

## 2014-01-29 ENCOUNTER — Other Ambulatory Visit: Payer: Self-pay | Admitting: *Deleted

## 2014-01-29 DIAGNOSIS — K5732 Diverticulitis of large intestine without perforation or abscess without bleeding: Secondary | ICD-10-CM | POA: Diagnosis not present

## 2014-01-29 DIAGNOSIS — R1032 Left lower quadrant pain: Secondary | ICD-10-CM | POA: Diagnosis present

## 2014-01-29 DIAGNOSIS — M5137 Other intervertebral disc degeneration, lumbosacral region: Secondary | ICD-10-CM | POA: Insufficient documentation

## 2014-01-29 DIAGNOSIS — M51379 Other intervertebral disc degeneration, lumbosacral region without mention of lumbar back pain or lower extremity pain: Secondary | ICD-10-CM | POA: Insufficient documentation

## 2014-01-29 DIAGNOSIS — B369 Superficial mycosis, unspecified: Secondary | ICD-10-CM

## 2014-01-29 MED ORDER — IOHEXOL 300 MG/ML  SOLN
100.0000 mL | Freq: Once | INTRAMUSCULAR | Status: AC | PRN
  Administered 2014-01-29: 100 mL via INTRAVENOUS

## 2014-01-29 MED ORDER — CLOTRIMAZOLE-BETAMETHASONE 1-0.05 % EX CREA: 1.0000 "application " | TOPICAL_CREAM | Freq: Two times a day (BID) | CUTANEOUS | Status: DC

## 2014-01-29 NOTE — Telephone Encounter (Signed)
Pt returning call

## 2014-01-29 NOTE — Progress Notes (Unsigned)
Call Documentation      Mosie Lukes, MD at 01/27/2014  8:33 AM      Status: Signed            OK to change the quantity of Lotrisone to 45 g        De Burrs, CMA at 01/26/2014  4:54 PM      Status: Signed            Jerene Pitch stated that"Lotrisone Cream is Rx'd for #30 but #45 is available and needs authorization to ok it." LDM    From [employer] To [name] [[id number]] Composed 01/28/2014 11:24 AM For Delivery On 01/28/2014 11:24 AM Subject Non-Urgent Medical Question Message Type Patient Medical Advice Request Read Status Y Message Body Friday, 01/26/14, a precription for Lotrisone was sent to [facility] on [address], [place]. The pharmacist had a question about the strength and a call was sent to [facility] and left a message with the nurse. No one responded to the message and subsequently [facility] could not fill the RX and I went the weekend without this medicine. Thank goodness this was not something urgent. I think all messages should be addressed before office closing for the day and especially the weekend.   [name]

## 2014-02-05 NOTE — Assessment & Plan Note (Signed)
Rx Lotrisone cream. Keep area clean and patted dry.

## 2014-03-29 ENCOUNTER — Other Ambulatory Visit: Payer: Self-pay | Admitting: Family Medicine

## 2014-04-03 ENCOUNTER — Other Ambulatory Visit: Payer: Self-pay | Admitting: Family Medicine

## 2014-05-21 ENCOUNTER — Other Ambulatory Visit: Payer: Self-pay | Admitting: Family Medicine

## 2014-06-08 HISTORY — PX: COLONOSCOPY: SHX174

## 2014-08-20 ENCOUNTER — Encounter: Payer: Self-pay | Admitting: Family Medicine

## 2014-09-05 ENCOUNTER — Other Ambulatory Visit: Payer: Self-pay | Admitting: Family Medicine

## 2014-10-02 ENCOUNTER — Ambulatory Visit: Payer: Medicare HMO | Admitting: Family Medicine

## 2014-10-17 ENCOUNTER — Other Ambulatory Visit: Payer: Self-pay | Admitting: Family Medicine

## 2014-10-17 NOTE — Telephone Encounter (Signed)
Re order completed

## 2014-11-09 ENCOUNTER — Encounter: Payer: Self-pay | Admitting: Family Medicine

## 2014-11-12 ENCOUNTER — Other Ambulatory Visit: Payer: Self-pay | Admitting: Family Medicine

## 2014-11-12 DIAGNOSIS — Z124 Encounter for screening for malignant neoplasm of cervix: Secondary | ICD-10-CM

## 2014-11-13 ENCOUNTER — Encounter: Payer: Self-pay | Admitting: Family Medicine

## 2014-12-03 ENCOUNTER — Other Ambulatory Visit: Payer: Self-pay

## 2014-12-13 ENCOUNTER — Other Ambulatory Visit: Payer: Self-pay | Admitting: Family Medicine

## 2014-12-17 ENCOUNTER — Telehealth: Payer: Self-pay

## 2014-12-25 ENCOUNTER — Ambulatory Visit (INDEPENDENT_AMBULATORY_CARE_PROVIDER_SITE_OTHER): Payer: Medicare HMO

## 2014-12-25 VITALS — BP 128/74 | HR 71 | Temp 97.7°F | Ht 60.0 in | Wt 188.4 lb

## 2014-12-25 DIAGNOSIS — E7800 Pure hypercholesterolemia, unspecified: Secondary | ICD-10-CM

## 2014-12-25 DIAGNOSIS — I10 Essential (primary) hypertension: Secondary | ICD-10-CM

## 2014-12-25 DIAGNOSIS — Z23 Encounter for immunization: Secondary | ICD-10-CM | POA: Diagnosis not present

## 2014-12-25 DIAGNOSIS — Z0000 Encounter for general adult medical examination without abnormal findings: Secondary | ICD-10-CM

## 2014-12-25 LAB — COMPREHENSIVE METABOLIC PANEL
ALT: 14 U/L (ref 0–35)
AST: 19 U/L (ref 0–37)
Albumin: 4.5 g/dL (ref 3.5–5.2)
Alkaline Phosphatase: 42 U/L (ref 39–117)
BUN: 22 mg/dL (ref 6–23)
CHLORIDE: 103 meq/L (ref 96–112)
CO2: 30 meq/L (ref 19–32)
Calcium: 10.2 mg/dL (ref 8.4–10.5)
Creatinine, Ser: 1.05 mg/dL (ref 0.40–1.20)
GFR: 55.64 mL/min — AB (ref >60.00)
Glucose, Bld: 101 mg/dL — ABNORMAL HIGH (ref 70–99)
POTASSIUM: 4.5 meq/L (ref 3.5–5.1)
Sodium: 140 mEq/L (ref 135–145)
TOTAL PROTEIN: 7.4 g/dL (ref 6.0–8.3)
Total Bilirubin: 0.4 mg/dL (ref 0.2–1.2)

## 2014-12-25 LAB — LIPID PANEL
CHOLESTEROL: 189 mg/dL (ref 0–200)
HDL: 42.3 mg/dL (ref >39.00)
LDL Cholesterol: 112 mg/dL — ABNORMAL HIGH (ref 0–99)
NonHDL: 146.7
Total CHOL/HDL Ratio: 4
Triglycerides: 175 mg/dL — ABNORMAL HIGH (ref 0.0–149.0)
VLDL: 35 mg/dL (ref 0.0–40.0)

## 2014-12-25 LAB — TSH: TSH: 2.21 u[IU]/mL (ref 0.35–4.50)

## 2014-12-25 NOTE — Progress Notes (Signed)
Subjective:   [name] is a 64 y.o. female who presents for Medicare Annual (Subsequent) preventive examination.  She describes her overall health as good.    Review of Systems:   Risk Factors:  Hypertension-on metoprolol and HCTZ Hyperlipidemia- on lovastatin  BMI-36.79-Pt has gained 10 lbs   Sleep patterns:  10 hours per night/ wakes up rested  Fall Risk:  LOW Risk ADLs (bathing, grooming/dressing, walking, toileting):  Independent IADLs (shopping, housekeeping, medication management, handling finances):  Independent Urinary Incontinence: NO leaks.  Wakes up at least 5 times per night.     Home Safety/Smoke Alarms:  Feels safe.  Lives at home alone. Smoke detector and alarm system present.   Firearm Safety:  Firearms kept in a safe place.    Seat Belt Safety/Bike Helmet:  Always wears seat belt.   Counseling:   Pap- 11/28/12--DUE     Mammo-DUE       Dexa scan- 08/29/13      Colonoscopy- 05/11/11-DUE  Needs colonoscopy, mammogram, and pap smear done.  Plans to have mammogram and pap done in September 2016.  Referral placed for colonoscopy.    Eye Exam- 1 year ago--[facility]- Dr. Harvel Quale a year     Objective:     Vitals: BP 128/74 mmHg  Pulse 71  Temp(Src) 97.7 F (36.5 C) (Oral)  Ht 5' (1.524 m)  Wt 188 lb 6.4 oz (85.458 kg)  BMI 36.79 kg/m2  SpO2 95%  Tobacco History  Smoking status  . Former Smoker  . Types: Cigarettes  . Quit date: 06/08/1982  Smokeless tobacco  . Never Used     Counseling given: Not Answered   Past Medical History  Diagnosis Date  . History  of basal cell carcinoma   . History of cardiac arrhythmia   . Arthritis   . Glaucoma   . Hyperlipidemia   . Essential hypertension, benign   . Glucose intolerance (impaired glucose tolerance)   . Depression   . Medicare welcome visit 11/28/2012  . H/O tobacco use, presenting hazards to health 09/02/2013  . GERD (gastroesophageal reflux disease) 09/02/2013  .  Personal history of colonic polyps 09/02/2013    Patient reports H/O 2 colonoscopies, last one dates back to 2012 in [place] She reports they were labeled as incomplete.  Reports 1st colonoscopy had 1 benign polyp and the 2nd was clear.   . Benign paroxysmal positional vertigo 11/14/2013   Past Surgical History  Procedure Laterality Date  . Dilation and curettage of uterus  1966  . Knee surgery Left 11/01/2008    left knee total replacement Dr Sharol Given   Family History  Problem Relation Age of Onset  . Coronary artery disease Paternal Grandmother   . Hyperlipidemia Paternal Grandmother   . Hypertension Paternal Grandmother   . Stroke Paternal Grandmother   . Hyperlipidemia    . Hypertension Mother   . Lung cancer Mother     smoker  . Uterine cancer Mother   . Birth defects Mother     lung cancer in smoker  . COPD Mother   . Hypertension Father   . Kidney disease Father     Renal Cell Carcinoma  . Birth defects Father     renal cell CA, smoker  . Hypertension Maternal Grandmother   . Hypertension Maternal Grandfather   . Hypertension Paternal Grandfather   . Dementia Paternal Grandfather   . Colon cancer Neg Hx   . Cancer Brother  testicular  . Heart disease Brother     NI, smoker   History  Sexual Activity  . Sexual Activity: No    Comment: lives alone, no dietary restrictions except no lactose,    Outpatient Encounter Prescriptions as of 12/25/2014  Medication Sig  . aspirin 81 MG tablet Take 81 mg by mouth daily.    . fish oil-omega-3 fatty acids 1000 MG capsule Take 2 g by mouth 2 (two) times daily.  . hydrochlorothiazide (MICROZIDE) 12.5 MG capsule TAKE ONE CAPSULE BY MOUTH ONCE DAILY  . ibuprofen (ADVIL,MOTRIN) 200 MG tablet Take 200 mg by mouth every 6 (six) hours as needed for pain.  Marland Kitchen lovastatin (MEVACOR) 20 MG tablet Take 2 tablets (40 mg total) by mouth at bedtime.  . metoprolol (LOPRESSOR) 50 MG tablet TAKE ONE TABLET BY MOUTH TWICE DAILY  . Multiple Vitamin  (MULTIVITAMIN) tablet Take 1 tablet by mouth daily.    Marland Kitchen tretinoin (RETIN-A) 0.1 % cream Apply 1 application topically at bedtime. Apply to face.  . [DISCONTINUED] ciprofloxacin (CIPRO) 500 MG tablet Take 1 tablet (500 mg total) by mouth 2 (two) times daily. (Patient not taking: Reported on 12/25/2014)  . [DISCONTINUED] clotrimazole-betamethasone (LOTRISONE) cream Apply 1 application topically 2 (two) times daily. (Patient not taking: Reported on 12/25/2014)  . [DISCONTINUED] magnesium oxide (MAG-OX) 400 MG tablet Take 400 mg by mouth daily.  . [DISCONTINUED] metroNIDAZOLE (FLAGYL) 500 MG tablet Take 1 tablet (500 mg total) by mouth 3 (three) times daily. (Patient not taking: Reported on 12/25/2014)  . [DISCONTINUED] omeprazole (PRILOSEC) 20 MG capsule Take 1 capsule (20 mg total) by mouth daily as needed. (Patient not taking: Reported on 12/25/2014)   No facility-administered encounter medications on file as of 12/25/2014.    Activities of Daily Living In your present state of health, do you have any difficulty performing the following activities: 12/25/2014  Hearing? N  Vision? N  Difficulty concentrating or making decisions? N  Walking or climbing stairs? N  Dressing or bathing? N  Doing errands, shopping? N    Patient Care Team: Mosie Lukes, MD as PCP - General (Family Medicine) Rolm Bookbinder, MD as Consulting Physician (Dermatology)    Assessment:    Exercise Activities and Dietary recommendations Exercise- Sedentary  Diet (meal preparation, eat out, water intake, caffeinated beverages, dairy products, fruits and vegetables):   Regular diet (includes .  Drink lots of water and has 2 cups of coffee every morning.      Goals    . Complete Colonoscopy by the end of this year.      . Increase physical activity     Aim for 150 mins of moderate physical activity each week.      . Reduce calorie intake to 2000 calories per day      Fall Risk Fall Risk  12/25/2014 09/02/2013  Falls  in the past year? No No   Depression Screen PHQ 2/9 Scores 12/25/2014 09/02/2013  PHQ - 2 Score 0 0     Cognitive Testing AD8 screening done.  NO change noted.   Immunization History  Administered Date(s) Administered  . Pneumococcal Conjugate-13 08/28/2013  . Pneumococcal Polysaccharide-23 10/16/2008, 12/25/2014  . Tdap 11/28/2012  . Zoster 12/02/2012   Screening Tests Health Maintenance  Topic Date Due  . COLONOSCOPY  05/10/2013  . MAMMOGRAM  11/29/2014  . INFLUENZA VACCINE  01/07/2015  . TETANUS/TDAP  11/29/2022  . DEXA SCAN  Completed  . ZOSTAVAX  Completed  . PNA vac Low  Risk Adult  Completed      Plan:  Work on increase physical activity and eating healthy.  Schedule colonoscopy and complete PAP and Mammogram.     During the course of the visit the patient was educated and counseled about the following appropriate screening and preventive services:   Vaccines to include Pneumoccal, Influenza, Hepatitis B, Td, Zostavax, HCV  Electrocardiogram  Cardiovascular Disease  Colorectal cancer screening  Bone density screening  Diabetes screening  Glaucoma screening  Mammography/PAP  Nutrition counseling   Patient Instructions (the written plan) was given to the patient.   Rudene Anda, RN  12/25/2014

## 2014-12-25 NOTE — Patient Instructions (Addendum)
Work on increase physical activity and eating healthy.  Schedule colonoscopy and complete PAP and Mammogram.     Colonoscopy A colonoscopy is an exam to look at the entire large intestine (colon). This exam can help find problems such as tumors, polyps, inflammation, and areas of bleeding. The exam takes about 1 hour.  LET [facility] CARE PROVIDER KNOW ABOUT:   Any allergies you have.  All medicines you are taking, including vitamins, herbs, eye drops, creams, and over-the-counter medicines.  Previous problems you or members of your family have had with the use of anesthetics.  Any blood disorders you have.  Previous surgeries you have had.  Medical conditions you have. RISKS AND COMPLICATIONS  Generally, this is a safe procedure. However, as with any procedure, complications can occur. Possible complications include:  Bleeding.  Tearing or rupture of the colon wall.  Reaction to medicines given during the exam.  Infection (rare). BEFORE THE PROCEDURE   Ask your health care provider about changing or stopping your regular medicines.  You may be prescribed an oral bowel prep. This involves drinking a large amount of medicated liquid, starting the day before your procedure. The liquid will cause you to have multiple loose stools until your stool is almost clear or light green. This cleans out your colon in preparation for the procedure.  Do not eat or drink anything else once you have started the bowel prep, unless your health care provider tells you it is safe to do so.  Arrange for someone to drive you home after the procedure. PROCEDURE   You will be given medicine to help you relax (sedative).  You will lie on your side with your knees bent.  A long, flexible tube with a light and camera on the end (colonoscope) will be inserted through the rectum and into the colon. The camera sends video back to a computer screen as it moves through the colon. The colonoscope also  releases carbon dioxide gas to inflate the colon. This helps your health care provider see the area better.  During the exam, your health care provider may take a small tissue sample (biopsy) to be examined under a microscope if any abnormalities are found.  The exam is finished when the entire colon has been viewed. AFTER THE PROCEDURE   Do not drive for 24 hours after the exam.  You may have a small amount of blood in your stool.  You may pass moderate amounts of gas and have mild abdominal cramping or bloating. This is caused by the gas used to inflate your colon during the exam.  Ask when your test results will be ready and how you will get your results. Make sure you get your test results. Document Released: 05/22/2000 Document Revised: 03/15/2013 Document Reviewed: 01/30/2013 [facility] Patient Information 2015 [place], [place]. This information is not intended to replace advice given to you by your health care provider. Make sure you discuss any questions you have with your health care provider.  Health Maintenance Adopting a healthy lifestyle and getting preventive care can go a long way to promote health and wellness. Talk with your health care provider about what schedule of regular examinations is right for you. This is a good chance for you to check in with your provider about disease prevention and staying healthy. In between checkups, there are plenty of things you can do on your own. Experts have done a lot of research about which lifestyle changes and preventive measures are most likely  to keep you healthy. Ask your health care provider for more information. WEIGHT AND DIET  Eat a healthy diet  Be sure to include plenty of vegetables, fruits, low-fat dairy products, and lean protein.  Do not eat a lot of foods high in solid fats, added sugars, or salt.  Get regular exercise. This is one of the most important things you can do for your health.  Most adults should exercise for  at least 150 minutes each week. The exercise should increase your heart rate and make you sweat (moderate-intensity exercise).  Most adults should also do strengthening exercises at least twice a week. This is in addition to the moderate-intensity exercise.  Maintain a healthy weight  Body mass index (BMI) is a measurement that can be used to identify possible weight problems. It estimates body fat based on height and weight. Your health care provider can help determine your BMI and help you achieve or maintain a healthy weight.  For females 59 years of age and older:   A BMI below 18.5 is considered underweight.  A BMI of 18.5 to 24.9 is normal.  A BMI of 25 to 29.9 is considered overweight.  A BMI of 30 and above is considered obese.  Watch levels of cholesterol and blood lipids  You should start having your blood tested for lipids and cholesterol at 66 years of age, then have this test every 5 years.  You may need to have your cholesterol levels checked more often if:  Your lipid or cholesterol levels are high.  You are older than 66 years of age.  You are at high risk for heart disease.  CANCER SCREENING   Lung Cancer  Lung cancer screening is recommended for adults 26-19 years old who are at high risk for lung cancer because of a history of smoking.  A yearly low-dose CT scan of the lungs is recommended for people who:  Currently smoke.  Have quit within the past 15 years.  Have at least a 30-pack-year history of smoking. A pack year is smoking an average of one pack of cigarettes a day for 1 year.  Yearly screening should continue until it has been 15 years since you quit.  Yearly screening should stop if you develop a health problem that would prevent you from having lung cancer treatment.  Breast Cancer  Practice breast self-awareness. This means understanding how your breasts normally appear and feel.  It also means doing regular breast self-exams. Let  your health care provider know about any changes, no matter how small.  If you are in your 20s or 30s, you should have a clinical breast exam (CBE) by a health care provider every 1-3 years as part of a regular health exam.  If you are 59 or older, have a CBE every year. Also consider having a breast X-ray (mammogram) every year.  If you have a family history of breast cancer, talk to your health care provider about genetic screening.  If you are at high risk for breast cancer, talk to your health care provider about having an MRI and a mammogram every year.  Breast cancer gene (BRCA) assessment is recommended for women who have family members with BRCA-related cancers. BRCA-related cancers include:  Breast.  Ovarian.  Tubal.  Peritoneal cancers.  Results of the assessment will determine the need for genetic counseling and BRCA1 and BRCA2 testing. Cervical Cancer Routine pelvic examinations to screen for cervical cancer are no longer recommended for nonpregnant women who  are considered low risk for cancer of the pelvic organs (ovaries, uterus, and vagina) and who do not have symptoms. A pelvic examination may be necessary if you have symptoms including those associated with pelvic infections. Ask your health care provider if a screening pelvic exam is right for you.   The Pap test is the screening test for cervical cancer for women who are considered at risk.  If you had a hysterectomy for a problem that was not cancer or a condition that could lead to cancer, then you no longer need Pap tests.  If you are older than 65 years, and you have had normal Pap tests for the past 10 years, you no longer need to have Pap tests.  If you have had past treatment for cervical cancer or a condition that could lead to cancer, you need Pap tests and screening for cancer for at least 20 years after your treatment.  If you no longer get a Pap test, assess your risk factors if they change (such as  having a new sexual partner). This can affect whether you should start being screened again.  Some women have medical problems that increase their chance of getting cervical cancer. If this is the case for you, your health care provider may recommend more frequent screening and Pap tests.  The human papillomavirus (HPV) test is another test that may be used for cervical cancer screening. The HPV test looks for the virus that can cause cell changes in the cervix. The cells collected during the Pap test can be tested for HPV.  The HPV test can be used to screen women 56 years of age and older. Getting tested for HPV can extend the interval between normal Pap tests from three to five years.  An HPV test also should be used to screen women of any age who have unclear Pap test results.  After 66 years of age, women should have HPV testing as often as Pap tests.  Colorectal Cancer  This type of cancer can be detected and often prevented.  Routine colorectal cancer screening usually begins at 66 years of age and continues through 66 years of age.  Your health care provider may recommend screening at an earlier age if you have risk factors for colon cancer.  Your health care provider may also recommend using home test kits to check for hidden blood in the stool.  A small camera at the end of a tube can be used to examine your colon directly (sigmoidoscopy or colonoscopy). This is done to check for the earliest forms of colorectal cancer.  Routine screening usually begins at age 21.  Direct examination of the colon should be repeated every 5-10 years through 66 years of age. However, you may need to be screened more often if early forms of precancerous polyps or small growths are found. Skin Cancer  Check your skin from head to toe regularly.  Tell your health care provider about any new moles or changes in moles, especially if there is a change in a mole's shape or color.  Also tell your  health care provider if you have a mole that is larger than the size of a pencil eraser.  Always use sunscreen. Apply sunscreen liberally and repeatedly throughout the day.  Protect yourself by wearing long sleeves, pants, a wide-brimmed hat, and sunglasses whenever you are outside. HEART DISEASE, DIABETES, AND HIGH BLOOD PRESSURE   Have your blood pressure checked at least every 1-2 years. High blood  pressure causes heart disease and increases the risk of stroke.  If you are between 12 years and 70 years old, ask your health care provider if you should take aspirin to prevent strokes.  Have regular diabetes screenings. This involves taking a blood sample to check your fasting blood sugar level.  If you are at a normal weight and have a low risk for diabetes, have this test once every three years after 65 years of age.  If you are overweight and have a high risk for diabetes, consider being tested at a younger age or more often. PREVENTING INFECTION  Hepatitis B  If you have a higher risk for hepatitis B, you should be screened for this virus. You are considered at high risk for hepatitis B if:  You were born in a country where hepatitis B is common. Ask your health care provider which countries are considered high risk.  Your parents were born in a high-risk country, and you have not been immunized against hepatitis B (hepatitis B vaccine).  You have HIV or AIDS.  You use needles to inject street drugs.  You live with someone who has hepatitis B.  You have had sex with someone who has hepatitis B.  You get hemodialysis treatment.  You take certain medicines for conditions, including cancer, organ transplantation, and autoimmune conditions. Hepatitis C  Blood testing is recommended for:  Everyone born from [birth year] through 1967.  Anyone with known risk factors for hepatitis C. Sexually transmitted infections (STIs)  You should be screened for sexually transmitted infections  (STIs) including gonorrhea and chlamydia if:  You are sexually active and are younger than 66 years of age.  You are older than 66 years of age and your health care provider tells you that you are at risk for this type of infection.  Your sexual activity has changed since you were last screened and you are at an increased risk for chlamydia or gonorrhea. Ask your health care provider if you are at risk.  If you do not have HIV, but are at risk, it may be recommended that you take a prescription medicine daily to prevent HIV infection. This is called pre-exposure prophylaxis (PrEP). You are considered at risk if:  You are sexually active and do not regularly use condoms or know the HIV status of your partner(s).  You take drugs by injection.  You are sexually active with a partner who has HIV. Talk with your health care provider about whether you are at high risk of being infected with HIV. If you choose to begin PrEP, you should first be tested for HIV. You should then be tested every 3 months for as long as you are taking PrEP.  PREGNANCY   If you are premenopausal and you may become pregnant, ask your health care provider about preconception counseling.  If you may become pregnant, take 400 to 800 micrograms (mcg) of folic acid every day.  If you want to prevent pregnancy, talk to your health care provider about birth control (contraception). OSTEOPOROSIS AND MENOPAUSE   Osteoporosis is a disease in which the bones lose minerals and strength with aging. This can result in serious bone fractures. Your risk for osteoporosis can be identified using a bone density scan.  If you are 25 years of age or older, or if you are at risk for osteoporosis and fractures, ask your health care provider if you should be screened.  Ask your health care provider whether you should take a  calcium or vitamin D supplement to lower your risk for osteoporosis.  Menopause may have certain physical symptoms  and risks.  Hormone replacement therapy may reduce some of these symptoms and risks. Talk to your health care provider about whether hormone replacement therapy is right for you.  HOME CARE INSTRUCTIONS   Schedule regular health, dental, and eye exams.  Stay current with your immunizations.   Do not use any tobacco products including cigarettes, chewing tobacco, or electronic cigarettes.  If you are pregnant, do not drink alcohol.  If you are breastfeeding, limit how much and how often you drink alcohol.  Limit alcohol intake to no more than 1 drink per day for nonpregnant women. One drink equals 12 ounces of beer, 5 ounces of wine, or 1 ounces of hard liquor.  Do not use street drugs.  Do not share needles.  Ask your health care provider for help if you need support or information about quitting drugs.  Tell your health care provider if you often feel depressed.  Tell your health care provider if you have ever been abused or do not feel safe at home. Document Released: 12/08/2010 Document Revised: 10/09/2013 Document Reviewed: 04/26/2013 [facility] Patient Information 2015 [place], [place]. This information is not intended to replace advice given to you by your health care provider. Make sure you discuss any questions you have with your health care provider.

## 2014-12-26 ENCOUNTER — Telehealth: Payer: Self-pay

## 2014-12-26 MED ORDER — METOPROLOL TARTRATE 50 MG PO TABS: 50.0000 mg | ORAL_TABLET | Freq: Two times a day (BID) | ORAL | Status: DC

## 2014-12-26 NOTE — Telephone Encounter (Signed)
Pt requesting a refill on metoprolol, lovastatin, and hctz.  She would also like for Dr. Charlett Blake to take over managing her Tretinoin 0.1% cream.  This is typically prescribed by her dermatologist Dr. Ubaldo Glassing.  Pt was informed that she should have med refilled by dermatologist.  Pt states she no longer goes to see her dermatologist.     Metoprolol Last filled:  10/17/14 Amt: 180, 0 Med filled.    Lovastatin Last filled: 08/28/13 Amt: 180, 1 Too soon to refill.   Hydrochlorothiazide   Last filled: 12/13/14 Amt: 90, 0 Too soon to refill.  Next appointment 03/04/15.     Dr Thomasena Edis advise regarding Tretinoin

## 2014-12-26 NOTE — Telephone Encounter (Signed)
Called left message to call back 

## 2014-12-26 NOTE — Addendum Note (Signed)
Addended by: Sharon Seller B on: 12/26/2014 02:47 PM   Modules accepted: Orders

## 2014-12-26 NOTE — Telephone Encounter (Signed)
I am willing to writ her current meds for 30 d with 5 rf or 90 with 1 rf at patient discrestion. Am willing to write the tretinoin,  Confirm how she uses and how much she gets and then can disp the larges tube they have but warn her insurance often will not pay for it these days

## 2014-12-27 NOTE — Telephone Encounter (Signed)
Patient returned phone call. Best # [phone]

## 2014-12-27 NOTE — Telephone Encounter (Signed)
Last AWV:  12/25/14.    Left a message for call back.

## 2014-12-28 MED ORDER — LOVASTATIN 20 MG PO TABS: 40.0000 mg | ORAL_TABLET | Freq: Every day | ORAL | Status: DC

## 2014-12-28 NOTE — Telephone Encounter (Signed)
Patient states that she is returning your phone call? Best # [phone]

## 2014-12-28 NOTE — Addendum Note (Signed)
Addended by: Rudene Anda on: 12/28/2014 11:57 AM   Modules accepted: Orders, Medications

## 2014-12-28 NOTE — Telephone Encounter (Addendum)
Pt states only needs lovastatin refilled.  Med filled.  She takes Tretinoin 0.1%-apply pea-sized amount topically to face at bedtime.  Pt aware that insurance will not cover.  45 g, and how many refills?    Please advise.

## 2014-12-29 NOTE — Telephone Encounter (Signed)
She can have 3 rf

## 2014-12-31 MED ORDER — TRETINOIN 0.1 % EX CREA: 1.0000 "application " | TOPICAL_CREAM | Freq: Every day | CUTANEOUS | Status: DC

## 2014-12-31 NOTE — Telephone Encounter (Signed)
Med filled.  

## 2014-12-31 NOTE — Telephone Encounter (Signed)
CPE scheduled on 02/08/15.

## 2014-12-31 NOTE — Addendum Note (Signed)
Addended by: Rudene Anda on: 12/31/2014 08:13 AM   Modules accepted: Orders

## 2015-01-04 ENCOUNTER — Encounter: Payer: Self-pay | Admitting: Gastroenterology

## 2015-01-15 NOTE — Addendum Note (Signed)
Addended by: Jonathon Bellows L on: 01/15/2015 12:06 PM   Modules accepted: Level of Service

## 2015-01-15 NOTE — Addendum Note (Signed)
Addended by: Penni Homans A on: 01/15/2015 11:36 AM   Modules accepted: Level of Service

## 2015-01-17 NOTE — Progress Notes (Signed)
AWV reviewed no concerns identified

## 2015-01-18 NOTE — Progress Notes (Signed)
I have reviewed the AWV documentation.

## 2015-01-22 ENCOUNTER — Telehealth: Payer: Self-pay | Admitting: *Deleted

## 2015-01-22 NOTE — Telephone Encounter (Signed)
Prior auth initiated for Tretinoin cream. Awaiting determination. JG//CMA

## 2015-01-30 NOTE — Telephone Encounter (Signed)
PA approved effective 06/06/2014 - 06/08/2015. Reference number: [id number]. Approval letter sent for scanning. JG//CMA

## 2015-02-08 ENCOUNTER — Encounter: Payer: Self-pay | Admitting: Family Medicine

## 2015-02-08 ENCOUNTER — Ambulatory Visit (INDEPENDENT_AMBULATORY_CARE_PROVIDER_SITE_OTHER): Payer: Medicare HMO | Admitting: Family Medicine

## 2015-02-08 VITALS — BP 124/78 | HR 70 | Temp 98.4°F | Wt 194.1 lb

## 2015-02-08 DIAGNOSIS — E739 Lactose intolerance, unspecified: Secondary | ICD-10-CM

## 2015-02-08 DIAGNOSIS — E785 Hyperlipidemia, unspecified: Secondary | ICD-10-CM | POA: Diagnosis not present

## 2015-02-08 DIAGNOSIS — I10 Essential (primary) hypertension: Secondary | ICD-10-CM

## 2015-02-08 MED ORDER — HYDROCHLOROTHIAZIDE 12.5 MG PO CAPS: 12.5000 mg | ORAL_CAPSULE | Freq: Every day | ORAL | Status: DC

## 2015-02-08 MED ORDER — LOVASTATIN 20 MG PO TABS: 40.0000 mg | ORAL_TABLET | Freq: Every day | ORAL | Status: DC

## 2015-02-08 MED ORDER — TRETINOIN 0.1 % EX CREA: 1.0000 "application " | TOPICAL_CREAM | Freq: Every day | CUTANEOUS | Status: DC

## 2015-02-08 MED ORDER — METOPROLOL TARTRATE 50 MG PO TABS: 50.0000 mg | ORAL_TABLET | Freq: Two times a day (BID) | ORAL | Status: DC

## 2015-02-08 NOTE — Progress Notes (Signed)
Pre visit review using our clinic review tool, if applicable. No additional management support is needed unless otherwise documented below in the visit note. 

## 2015-02-08 NOTE — Patient Instructions (Signed)
Preventive Care for Adults A healthy lifestyle and preventive care can promote health and wellness. Preventive health guidelines for women include the following key practices.  A routine yearly physical is a good way to check with your health care provider about your health and preventive screening. It is a chance to share any concerns and updates on your health and to receive a thorough exam.  Visit your dentist for a routine exam and preventive care every 6 months. Brush your teeth twice a day and floss once a day. Good oral hygiene prevents tooth decay and gum disease.  The frequency of eye exams is based on your age, health, family medical history, use of contact lenses, and other factors. Follow your health care provider's recommendations for frequency of eye exams.  Eat a healthy diet. Foods like vegetables, fruits, whole grains, low-fat dairy products, and lean protein foods contain the nutrients you need without too many calories. Decrease your intake of foods high in solid fats, added sugars, and salt. Eat the right amount of calories for you.Get information about a proper diet from your health care provider, if necessary.  Regular physical exercise is one of the most important things you can do for your health. Most adults should get at least 150 minutes of moderate-intensity exercise (any activity that increases your heart rate and causes you to sweat) each week. In addition, most adults need muscle-strengthening exercises on 2 or more days a week.  Maintain a healthy weight. The body mass index (BMI) is a screening tool to identify possible weight problems. It provides an estimate of body fat based on height and weight. Your health care provider can find your BMI and can help you achieve or maintain a healthy weight.For adults 20 years and older:  A BMI below 18.5 is considered underweight.  A BMI of 18.5 to 24.9 is normal.  A BMI of 25 to 29.9 is considered overweight.  A BMI of  30 and above is considered obese.  Maintain normal blood lipids and cholesterol levels by exercising and minimizing your intake of saturated fat. Eat a balanced diet with plenty of fruit and vegetables. Blood tests for lipids and cholesterol should begin at age 76 and be repeated every 5 years. If your lipid or cholesterol levels are high, you are over 50, or you are at high risk for heart disease, you may need your cholesterol levels checked more frequently.Ongoing high lipid and cholesterol levels should be treated with medicines if diet and exercise are not working.  If you smoke, find out from your health care provider how to quit. If you do not use tobacco, do not start.  Lung cancer screening is recommended for adults aged 22-80 years who are at high risk for developing lung cancer because of a history of smoking. A yearly low-dose CT scan of the lungs is recommended for people who have at least a 30-pack-year history of smoking and are a current smoker or have quit within the past 15 years. A pack year of smoking is smoking an average of 1 pack of cigarettes a day for 1 year (for example: 1 pack a day for 30 years or 2 packs a day for 15 years). Yearly screening should continue until the smoker has stopped smoking for at least 15 years. Yearly screening should be stopped for people who develop a health problem that would prevent them from having lung cancer treatment.  If you are pregnant, do not drink alcohol. If you are breastfeeding,  be very cautious about drinking alcohol. If you are not pregnant and choose to drink alcohol, do not have more than 1 drink per day. One drink is considered to be 12 ounces (355 mL) of beer, 5 ounces (148 mL) of wine, or 1.5 ounces (44 mL) of liquor.  Avoid use of street drugs. Do not share needles with anyone. Ask for help if you need support or instructions about stopping the use of drugs.  High blood pressure causes heart disease and increases the risk of  stroke. Your blood pressure should be checked at least every 1 to 2 years. Ongoing high blood pressure should be treated with medicines if weight loss and exercise do not work.  If you are 75-52 years old, ask your health care provider if you should take aspirin to prevent strokes.  Diabetes screening involves taking a blood sample to check your fasting blood sugar level. This should be done once every 3 years, after age 15, if you are within normal weight and without risk factors for diabetes. Testing should be considered at a younger age or be carried out more frequently if you are overweight and have at least 1 risk factor for diabetes.  Breast cancer screening is essential preventive care for women. You should practice "breast self-awareness." This means understanding the normal appearance and feel of your breasts and may include breast self-examination. Any changes detected, no matter how small, should be reported to a health care provider. Women in their 58s and 30s should have a clinical breast exam (CBE) by a health care provider as part of a regular health exam every 1 to 3 years. After age 16, women should have a CBE every year. Starting at age 53, women should consider having a mammogram (breast X-ray test) every year. Women who have a family history of breast cancer should talk to their health care provider about genetic screening. Women at a high risk of breast cancer should talk to their health care providers about having an MRI and a mammogram every year.  Breast cancer gene (BRCA)-related cancer risk assessment is recommended for women who have family members with BRCA-related cancers. BRCA-related cancers include breast, ovarian, tubal, and peritoneal cancers. Having family members with these cancers may be associated with an increased risk for harmful changes (mutations) in the breast cancer genes BRCA1 and BRCA2. Results of the assessment will determine the need for genetic counseling and  BRCA1 and BRCA2 testing.  Routine pelvic exams to screen for cancer are no longer recommended for nonpregnant women who are considered low risk for cancer of the pelvic organs (ovaries, uterus, and vagina) and who do not have symptoms. Ask your health care provider if a screening pelvic exam is right for you.  If you have had past treatment for cervical cancer or a condition that could lead to cancer, you need Pap tests and screening for cancer for at least 20 years after your treatment. If Pap tests have been discontinued, your risk factors (such as having a new sexual partner) need to be reassessed to determine if screening should be resumed. Some women have medical problems that increase the chance of getting cervical cancer. In these cases, your health care provider may recommend more frequent screening and Pap tests.  The HPV test is an additional test that may be used for cervical cancer screening. The HPV test looks for the virus that can cause the cell changes on the cervix. The cells collected during the Pap test can be  tested for HPV. The HPV test could be used to screen women aged 30 years and older, and should be used in women of any age who have unclear Pap test results. After the age of 30, women should have HPV testing at the same frequency as a Pap test.  Colorectal cancer can be detected and often prevented. Most routine colorectal cancer screening begins at the age of 50 years and continues through age 75 years. However, your health care provider may recommend screening at an earlier age if you have risk factors for colon cancer. On a yearly basis, your health care provider may provide home test kits to check for hidden blood in the stool. Use of a small camera at the end of a tube, to directly examine the colon (sigmoidoscopy or colonoscopy), can detect the earliest forms of colorectal cancer. Talk to your health care provider about this at age 50, when routine screening begins. Direct  exam of the colon should be repeated every 5-10 years through age 75 years, unless early forms of pre-cancerous polyps or small growths are found.  People who are at an increased risk for hepatitis B should be screened for this virus. You are considered at high risk for hepatitis B if:  You were born in a country where hepatitis B occurs often. Talk with your health care provider about which countries are considered high risk.  Your parents were born in a high-risk country and you have not received a shot to protect against hepatitis B (hepatitis B vaccine).  You have HIV or AIDS.  You use needles to inject street drugs.  You live with, or have sex with, someone who has hepatitis B.  You get hemodialysis treatment.  You take certain medicines for conditions like cancer, organ transplantation, and autoimmune conditions.  Hepatitis C blood testing is recommended for all people born from 1945 through 1965 and any individual with known risks for hepatitis C.  Practice safe sex. Use condoms and avoid high-risk sexual practices to reduce the spread of sexually transmitted infections (STIs). STIs include gonorrhea, chlamydia, syphilis, trichomonas, herpes, HPV, and human immunodeficiency virus (HIV). Herpes, HIV, and HPV are viral illnesses that have no cure. They can result in disability, cancer, and death.  You should be screened for sexually transmitted illnesses (STIs) including gonorrhea and chlamydia if:  You are sexually active and are younger than 24 years.  You are older than 24 years and your health care provider tells you that you are at risk for this type of infection.  Your sexual activity has changed since you were last screened and you are at an increased risk for chlamydia or gonorrhea. Ask your health care provider if you are at risk.  If you are at risk of being infected with HIV, it is recommended that you take a prescription medicine daily to prevent HIV infection. This is  called preexposure prophylaxis (PrEP). You are considered at risk if:  You are a heterosexual woman, are sexually active, and are at increased risk for HIV infection.  You take drugs by injection.  You are sexually active with a partner who has HIV.  Talk with your health care provider about whether you are at high risk of being infected with HIV. If you choose to begin PrEP, you should first be tested for HIV. You should then be tested every 3 months for as long as you are taking PrEP.  Osteoporosis is a disease in which the bones lose minerals and strength   with aging. This can result in serious bone fractures or breaks. The risk of osteoporosis can be identified using a bone density scan. Women ages 65 years and over and women at risk for fractures or osteoporosis should discuss screening with their health care providers. Ask your health care provider whether you should take a calcium supplement or vitamin D to reduce the rate of osteoporosis.  Menopause can be associated with physical symptoms and risks. Hormone replacement therapy is available to decrease symptoms and risks. You should talk to your health care provider about whether hormone replacement therapy is right for you.  Use sunscreen. Apply sunscreen liberally and repeatedly throughout the day. You should seek shade when your shadow is shorter than you. Protect yourself by wearing long sleeves, pants, a wide-brimmed hat, and sunglasses year round, whenever you are outdoors.  Once a month, do a whole body skin exam, using a mirror to look at the skin on your back. Tell your health care provider of new moles, moles that have irregular borders, moles that are larger than a pencil eraser, or moles that have changed in shape or color.  Stay current with required vaccines (immunizations).  Influenza vaccine. All adults should be immunized every year.  Tetanus, diphtheria, and acellular pertussis (Td, Tdap) vaccine. Pregnant women should  receive 1 dose of Tdap vaccine during each pregnancy. The dose should be obtained regardless of the length of time since the last dose. Immunization is preferred during the 27th-36th week of gestation. An adult who has not previously received Tdap or who does not know her vaccine status should receive 1 dose of Tdap. This initial dose should be followed by tetanus and diphtheria toxoids (Td) booster doses every 10 years. Adults with an unknown or incomplete history of completing a 3-dose immunization series with Td-containing vaccines should begin or complete a primary immunization series including a Tdap dose. Adults should receive a Td booster every 10 years.  Varicella vaccine. An adult without evidence of immunity to varicella should receive 2 doses or a second dose if she has previously received 1 dose. Pregnant females who do not have evidence of immunity should receive the first dose after pregnancy. This first dose should be obtained before leaving the health care facility. The second dose should be obtained 4-8 weeks after the first dose.  Human papillomavirus (HPV) vaccine. Females aged 13-26 years who have not received the vaccine previously should obtain the 3-dose series. The vaccine is not recommended for use in pregnant females. However, pregnancy testing is not needed before receiving a dose. If a female is found to be pregnant after receiving a dose, no treatment is needed. In that case, the remaining doses should be delayed until after the pregnancy. Immunization is recommended for any person with an immunocompromised condition through the age of 26 years if she did not get any or all doses earlier. During the 3-dose series, the second dose should be obtained 4-8 weeks after the first dose. The third dose should be obtained 24 weeks after the first dose and 16 weeks after the second dose.  Zoster vaccine. One dose is recommended for adults aged 60 years or older unless certain conditions are  present.  Measles, mumps, and rubella (MMR) vaccine. Adults born before 1957 generally are considered immune to measles and mumps. Adults born in 1957 or later should have 1 or more doses of MMR vaccine unless there is a contraindication to the vaccine or there is laboratory evidence of immunity to   each of the three diseases. A routine second dose of MMR vaccine should be obtained at least 28 days after the first dose for students attending postsecondary schools, health care workers, or international travelers. People who received inactivated measles vaccine or an unknown type of measles vaccine during 1963-1967 should receive 2 doses of MMR vaccine. People who received inactivated mumps vaccine or an unknown type of mumps vaccine before 1979 and are at high risk for mumps infection should consider immunization with 2 doses of MMR vaccine. For females of childbearing age, rubella immunity should be determined. If there is no evidence of immunity, females who are not pregnant should be vaccinated. If there is no evidence of immunity, females who are pregnant should delay immunization until after pregnancy. Unvaccinated health care workers born before 1957 who lack laboratory evidence of measles, mumps, or rubella immunity or laboratory confirmation of disease should consider measles and mumps immunization with 2 doses of MMR vaccine or rubella immunization with 1 dose of MMR vaccine.  Pneumococcal 13-valent conjugate (PCV13) vaccine. When indicated, a person who is uncertain of her immunization history and has no record of immunization should receive the PCV13 vaccine. An adult aged 19 years or older who has certain medical conditions and has not been previously immunized should receive 1 dose of PCV13 vaccine. This PCV13 should be followed with a dose of pneumococcal polysaccharide (PPSV23) vaccine. The PPSV23 vaccine dose should be obtained at least 8 weeks after the dose of PCV13 vaccine. An adult aged 19  years or older who has certain medical conditions and previously received 1 or more doses of PPSV23 vaccine should receive 1 dose of PCV13. The PCV13 vaccine dose should be obtained 1 or more years after the last PPSV23 vaccine dose.  Pneumococcal polysaccharide (PPSV23) vaccine. When PCV13 is also indicated, PCV13 should be obtained first. All adults aged 65 years and older should be immunized. An adult younger than age 65 years who has certain medical conditions should be immunized. Any person who resides in a nursing home or long-term care facility should be immunized. An adult smoker should be immunized. People with an immunocompromised condition and certain other conditions should receive both PCV13 and PPSV23 vaccines. People with human immunodeficiency virus (HIV) infection should be immunized as soon as possible after diagnosis. Immunization during chemotherapy or radiation therapy should be avoided. Routine use of PPSV23 vaccine is not recommended for American Indians, [place] Natives, or people younger than 65 years unless there are medical conditions that require PPSV23 vaccine. When indicated, people who have unknown immunization and have no record of immunization should receive PPSV23 vaccine. One-time revaccination 5 years after the first dose of PPSV23 is recommended for people aged 19-64 years who have chronic kidney failure, nephrotic syndrome, asplenia, or immunocompromised conditions. People who received 1-2 doses of PPSV23 before age 65 years should receive another dose of PPSV23 vaccine at age 65 years or later if at least 5 years have passed since the previous dose. Doses of PPSV23 are not needed for people immunized with PPSV23 at or after age 65 years.  Meningococcal vaccine. Adults with asplenia or persistent complement component deficiencies should receive 2 doses of quadrivalent meningococcal conjugate (MenACWY-D) vaccine. The doses should be obtained at least 2 months apart.  Microbiologists working with certain meningococcal bacteria, military recruits, people at risk during an outbreak, and people who travel to or live in countries with a high rate of meningitis should be immunized. A first-year college student up through age   21 years who is living in a residence hall should receive a dose if she did not receive a dose on or after her 16th birthday. Adults who have certain high-risk conditions should receive one or more doses of vaccine.  Hepatitis A vaccine. Adults who wish to be protected from this disease, have certain high-risk conditions, work with hepatitis A-infected animals, work in hepatitis A research labs, or travel to or work in countries with a high rate of hepatitis A should be immunized. Adults who were previously unvaccinated and who anticipate close contact with an international adoptee during the first 60 days after arrival in the [place] from a country with a high rate of hepatitis A should be immunized.  Hepatitis B vaccine. Adults who wish to be protected from this disease, have certain high-risk conditions, may be exposed to blood or other infectious body fluids, are household contacts or sex partners of hepatitis B positive people, are clients or workers in certain care facilities, or travel to or work in countries with a high rate of hepatitis B should be immunized.  Haemophilus influenzae type b (Hib) vaccine. A previously unvaccinated person with asplenia or sickle cell disease or having a scheduled splenectomy should receive 1 dose of Hib vaccine. Regardless of previous immunization, a recipient of a hematopoietic stem cell transplant should receive a 3-dose series 6-12 months after her successful transplant. Hib vaccine is not recommended for adults with HIV infection. Preventive Services / Frequency Ages 64 to 68 years  Blood pressure check.** / Every 1 to 2 years.  Lipid and cholesterol check.** / Every 5 years beginning at age  22.  Clinical breast exam.** / Every 3 years for women in their 88s and 53s.  BRCA-related cancer risk assessment.** / For women who have family members with a BRCA-related cancer (breast, ovarian, tubal, or peritoneal cancers).  Pap test.** / Every 2 years from ages 90 through 51. Every 3 years starting at age 21 through age 56 or 3 with a history of 3 consecutive normal Pap tests.  HPV screening.** / Every 3 years from ages 24 through ages 1 to 46 with a history of 3 consecutive normal Pap tests.  Hepatitis C blood test.** / For any individual with known risks for hepatitis C.  Skin self-exam. / Monthly.  Influenza vaccine. / Every year.  Tetanus, diphtheria, and acellular pertussis (Tdap, Td) vaccine.** / Consult your health care provider. Pregnant women should receive 1 dose of Tdap vaccine during each pregnancy. 1 dose of Td every 10 years.  Varicella vaccine.** / Consult your health care provider. Pregnant females who do not have evidence of immunity should receive the first dose after pregnancy.  HPV vaccine. / 3 doses over 6 months, if 72 and younger. The vaccine is not recommended for use in pregnant females. However, pregnancy testing is not needed before receiving a dose.  Measles, mumps, rubella (MMR) vaccine.** / You need at least 1 dose of MMR if you were born in 1957 or later. You may also need a 2nd dose. For females of childbearing age, rubella immunity should be determined. If there is no evidence of immunity, females who are not pregnant should be vaccinated. If there is no evidence of immunity, females who are pregnant should delay immunization until after pregnancy.  Pneumococcal 13-valent conjugate (PCV13) vaccine.** / Consult your health care provider.  Pneumococcal polysaccharide (PPSV23) vaccine.** / 1 to 2 doses if you smoke cigarettes or if you have certain conditions.  Meningococcal vaccine.** /  1 dose if you are age 19 to 21 years and a first-year college  student living in a residence hall, or have one of several medical conditions, you need to get vaccinated against meningococcal disease. You may also need additional booster doses.  Hepatitis A vaccine.** / Consult your health care provider.  Hepatitis B vaccine.** / Consult your health care provider.  Haemophilus influenzae type b (Hib) vaccine.** / Consult your health care provider. Ages 40 to 64 years  Blood pressure check.** / Every 1 to 2 years.  Lipid and cholesterol check.** / Every 5 years beginning at age 20 years.  Lung cancer screening. / Every year if you are aged 55-80 years and have a 30-pack-year history of smoking and currently smoke or have quit within the past 15 years. Yearly screening is stopped once you have quit smoking for at least 15 years or develop a health problem that would prevent you from having lung cancer treatment.  Clinical breast exam.** / Every year after age 40 years.  BRCA-related cancer risk assessment.** / For women who have family members with a BRCA-related cancer (breast, ovarian, tubal, or peritoneal cancers).  Mammogram.** / Every year beginning at age 40 years and continuing for as long as you are in good health. Consult with your health care provider.  Pap test.** / Every 3 years starting at age 30 years through age 65 or 70 years with a history of 3 consecutive normal Pap tests.  HPV screening.** / Every 3 years from ages 30 years through ages 65 to 70 years with a history of 3 consecutive normal Pap tests.  Fecal occult blood test (FOBT) of stool. / Every year beginning at age 50 years and continuing until age 75 years. You may not need to do this test if you get a colonoscopy every 10 years.  Flexible sigmoidoscopy or colonoscopy.** / Every 5 years for a flexible sigmoidoscopy or every 10 years for a colonoscopy beginning at age 50 years and continuing until age 75 years.  Hepatitis C blood test.** / For all people born from 1945 through  1965 and any individual with known risks for hepatitis C.  Skin self-exam. / Monthly.  Influenza vaccine. / Every year.  Tetanus, diphtheria, and acellular pertussis (Tdap/Td) vaccine.** / Consult your health care provider. Pregnant women should receive 1 dose of Tdap vaccine during each pregnancy. 1 dose of Td every 10 years.  Varicella vaccine.** / Consult your health care provider. Pregnant females who do not have evidence of immunity should receive the first dose after pregnancy.  Zoster vaccine.** / 1 dose for adults aged 60 years or older.  Measles, mumps, rubella (MMR) vaccine.** / You need at least 1 dose of MMR if you were born in 1957 or later. You may also need a 2nd dose. For females of childbearing age, rubella immunity should be determined. If there is no evidence of immunity, females who are not pregnant should be vaccinated. If there is no evidence of immunity, females who are pregnant should delay immunization until after pregnancy.  Pneumococcal 13-valent conjugate (PCV13) vaccine.** / Consult your health care provider.  Pneumococcal polysaccharide (PPSV23) vaccine.** / 1 to 2 doses if you smoke cigarettes or if you have certain conditions.  Meningococcal vaccine.** / Consult your health care provider.  Hepatitis A vaccine.** / Consult your health care provider.  Hepatitis B vaccine.** / Consult your health care provider.  Haemophilus influenzae type b (Hib) vaccine.** / Consult your health care provider. Ages 65   years and over  Blood pressure check.** / Every 1 to 2 years.  Lipid and cholesterol check.** / Every 5 years beginning at age 22 years.  Lung cancer screening. / Every year if you are aged 73-80 years and have a 30-pack-year history of smoking and currently smoke or have quit within the past 15 years. Yearly screening is stopped once you have quit smoking for at least 15 years or develop a health problem that would prevent you from having lung cancer  treatment.  Clinical breast exam.** / Every year after age 4 years.  BRCA-related cancer risk assessment.** / For women who have family members with a BRCA-related cancer (breast, ovarian, tubal, or peritoneal cancers).  Mammogram.** / Every year beginning at age 40 years and continuing for as long as you are in good health. Consult with your health care provider.  Pap test.** / Every 3 years starting at age 9 years through age 34 or 91 years with 3 consecutive normal Pap tests. Testing can be stopped between 65 and 70 years with 3 consecutive normal Pap tests and no abnormal Pap or HPV tests in the past 10 years.  HPV screening.** / Every 3 years from ages 57 years through ages 64 or 45 years with a history of 3 consecutive normal Pap tests. Testing can be stopped between 65 and 70 years with 3 consecutive normal Pap tests and no abnormal Pap or HPV tests in the past 10 years.  Fecal occult blood test (FOBT) of stool. / Every year beginning at age 15 years and continuing until age 17 years. You may not need to do this test if you get a colonoscopy every 10 years.  Flexible sigmoidoscopy or colonoscopy.** / Every 5 years for a flexible sigmoidoscopy or every 10 years for a colonoscopy beginning at age 86 years and continuing until age 71 years.  Hepatitis C blood test.** / For all people born from [birth year] through 1967 and any individual with known risks for hepatitis C.  Osteoporosis screening.** / A one-time screening for women ages 83 years and over and women at risk for fractures or osteoporosis.  Skin self-exam. / Monthly.  Influenza vaccine. / Every year.  Tetanus, diphtheria, and acellular pertussis (Tdap/Td) vaccine.** / 1 dose of Td every 10 years.  Varicella vaccine.** / Consult your health care provider.  Zoster vaccine.** / 1 dose for adults aged 61 years or older.  Pneumococcal 13-valent conjugate (PCV13) vaccine.** / Consult your health care provider.  Pneumococcal  polysaccharide (PPSV23) vaccine.** / 1 dose for all adults aged 28 years and older.  Meningococcal vaccine.** / Consult your health care provider.  Hepatitis A vaccine.** / Consult your health care provider.  Hepatitis B vaccine.** / Consult your health care provider.  Haemophilus influenzae type b (Hib) vaccine.** / Consult your health care provider. ** Family history and personal history of risk and conditions may change your health care provider's recommendations. Document Released: 07/21/2001 Document Revised: 10/09/2013 Document Reviewed: 10/20/2010 [facility] Patient Information 2015 [place], [place]. This information is not intended to replace advice given to you by your health care provider. Make sure you discuss any questions you have with your health care provider.

## 2015-02-24 NOTE — Progress Notes (Signed)
[name] [id number] 07/05/1950 02/24/2015      Progress Note New Patient  Subjective  Chief Complaint  Chief Complaint  Patient presents with  . Follow-up    HPI  Patient is a 64 year old female in today for follow-up. Overall feeling well. No recent illness. Her right forehead has had a lesion on for several weeks now is not itchy. Colonoscopy is scheduled for next month Denies CP/palp/SOB/HA/congestion/fevers/GI or GU c/o. Taking meds as prescribed  Past Medical History  Diagnosis Date  . History  of basal cell carcinoma   . History of cardiac arrhythmia   . Arthritis   . Glaucoma   . Hyperlipidemia   . Essential hypertension, benign   . Glucose intolerance (impaired glucose tolerance)   . Depression   . Medicare welcome visit 11/28/2012  . H/O tobacco use, presenting hazards to health 09/02/2013  . GERD (gastroesophageal reflux disease) 09/02/2013  . Personal history of colonic polyps 09/02/2013    Patient reports H/O 2 colonoscopies, last one dates back to 2012 in [place] She reports they were labeled as incomplete.  Reports 1st colonoscopy had 1 benign polyp and the 2nd was clear.   . Benign paroxysmal positional vertigo 11/14/2013    Past Surgical History  Procedure Laterality Date  . Dilation and curettage of uterus  1966  . Knee surgery Left 11/01/2008    left knee total replacement Dr Sharol Given    Family History  Problem Relation Age of Onset  . Coronary artery disease Paternal Grandmother   . Hyperlipidemia Paternal Grandmother   . Hypertension Paternal Grandmother   . Stroke Paternal Grandmother   . Hyperlipidemia    . Hypertension Mother   . Lung cancer Mother     smoker  . Uterine cancer Mother   . Birth defects Mother     lung cancer in smoker  . COPD Mother   . Hypertension Father   . Kidney disease Father     Renal Cell Carcinoma  . Birth defects Father     renal cell CA, smoker  . Hypertension Maternal Grandmother   . Hypertension Maternal  Grandfather   . Hypertension Paternal Grandfather   . Dementia Paternal Grandfather   . Colon cancer Neg Hx   . Cancer Brother     testicular  . Heart disease Brother     NI, smoker    Social History   Social History  . Marital Status: Single    Spouse Name: N/A  . Number of Children: N/A  . Years of Education: N/A   Occupational History  . Not on file.   Social History Main Topics  . Smoking status: Former Smoker    Types: Cigarettes    Quit date: 06/08/1982  . Smokeless tobacco: Never Used  . Alcohol Use: 0.0 oz/week     Comment: 1 beverage every couple  . Drug Use: No  . Sexual Activity: No     Comment: lives alone, no dietary restrictions except no lactose,   Other Topics Concern  . Not on file   Social History Narrative   Occupation: unemployed Product manager- now retired   Divorced   no children    Moved from [place] alone with 4 cats   Enjoys gardening, planting.              Current Outpatient Prescriptions on File Prior to Visit  Medication Sig Dispense Refill  . aspirin 81 MG tablet Take 81 mg by mouth daily.      Marland Kitchen  fish oil-omega-3 fatty acids 1000 MG capsule Take 2 g by mouth 2 (two) times daily.    Marland Kitchen ibuprofen (ADVIL,MOTRIN) 200 MG tablet Take 200 mg by mouth every 6 (six) hours as needed for pain.    . Multiple Vitamin (MULTIVITAMIN) tablet Take 1 tablet by mouth daily.       No current facility-administered medications on file prior to visit.    No Known Allergies  Review of Systems  Review of Systems  Constitutional: Negative for fever and malaise/fatigue.  HENT: Negative for congestion.   Eyes: Negative for discharge.  Respiratory: Negative for shortness of breath.   Cardiovascular: Negative for chest pain, palpitations and leg swelling.  Gastrointestinal: Negative for nausea and abdominal pain.  Genitourinary: Negative for dysuria.  Musculoskeletal: Negative for falls.  Skin: Negative for rash.  Neurological:  Negative for loss of consciousness and headaches.  Endo/Heme/Allergies: Negative for environmental allergies.  Psychiatric/Behavioral: Negative for depression. The patient is not nervous/anxious.     Objective  BP 124/78 mmHg  Pulse 70  Temp(Src) 98.4 F (36.9 C) (Oral)  Wt 194 lb 2 oz (88.055 kg)  SpO2 94%  Physical Exam  Physical Exam  Constitutional: She is oriented to person, place, and time and well-developed, well-nourished, and in no distress. No distress.  HENT:  Head: Normocephalic and atraumatic.  Eyes: Conjunctivae are normal.  Neck: Neck supple. No thyromegaly present.  Cardiovascular: Normal rate, regular rhythm and normal heart sounds.   No murmur heard. Pulmonary/Chest: Effort normal and breath sounds normal. She has no wheezes.  Abdominal: She exhibits no distension and no mass.  Musculoskeletal: She exhibits no edema.  Lymphadenopathy:    She has no cervical adenopathy.  Neurological: She is alert and oriented to person, place, and time.  Skin: Skin is warm and dry. No rash noted. She is not diaphoretic.  Psychiatric: Memory, affect and judgment normal.       Assessment & Plan  GLUCOSE INTOLERANCE hgba1c acceptable, minimize simple carbs. Increase exercise as tolerated.  Hyperlipidemia, mild Tolerating statin, encouraged heart healthy diet, avoid trans fats, minimize simple carbs and saturated fats. Increase exercise as tolerated  ESSENTIAL HYPERTENSION, BENIGN Well controlled, no changes to meds. Encouraged heart healthy diet such as the DASH diet and exercise as tolerated.

## 2015-02-24 NOTE — Assessment & Plan Note (Signed)
Tolerating statin, encouraged heart healthy diet, avoid trans fats, minimize simple carbs and saturated fats. Increase exercise as tolerated 

## 2015-02-24 NOTE — Assessment & Plan Note (Signed)
hgba1c acceptable, minimize simple carbs. Increase exercise as tolerated.  

## 2015-02-24 NOTE — Assessment & Plan Note (Signed)
Well controlled, no changes to meds. Encouraged heart healthy diet such as the DASH diet and exercise as tolerated.  °

## 2015-02-27 ENCOUNTER — Ambulatory Visit (AMBULATORY_SURGERY_CENTER): Payer: Self-pay | Admitting: *Deleted

## 2015-02-27 VITALS — Ht 60.0 in | Wt 191.0 lb

## 2015-02-27 DIAGNOSIS — Z86010 Personal history of colonic polyps: Secondary | ICD-10-CM

## 2015-02-27 MED ORDER — NA SULFATE-K SULFATE-MG SULF 17.5-3.13-1.6 GM/177ML PO SOLN: ORAL | Status: DC

## 2015-02-27 NOTE — Progress Notes (Signed)
Patient denies any allergies to eggs or soy. Patient denies any problems with anesthesia/sedation. She did have pain in hand/arm with propofol during last colonoscopy. Patient denies any oxygen use at home and does not take any diet/weight loss medications. EMMI education assisgned to patient on colonoscopy, this was explained and instructions given to patient.

## 2015-02-28 ENCOUNTER — Telehealth: Payer: Self-pay | Admitting: Gastroenterology

## 2015-02-28 NOTE — Telephone Encounter (Signed)
Left vm for pt to callback 

## 2015-03-01 NOTE — Telephone Encounter (Signed)
Called pt and told her that we will have a sample waiting for you at the front. Pt understands and states she will be in next week to pick it up.

## 2015-03-04 ENCOUNTER — Encounter: Payer: Medicare HMO | Admitting: Obstetrics & Gynecology

## 2015-03-04 ENCOUNTER — Ambulatory Visit: Payer: Medicare HMO | Admitting: Family Medicine

## 2015-03-05 ENCOUNTER — Telehealth: Payer: Self-pay | Admitting: Family Medicine

## 2015-03-05 MED ORDER — PREDNISONE 20 MG PO TABS: 20.0000 mg | ORAL_TABLET | Freq: Every day | ORAL | Status: DC

## 2015-03-05 NOTE — Telephone Encounter (Signed)
Can have Prednisone 20 mg tab 1 tab po daily x 5 days if no response has to come in. Warn her it can put sugar up so minimize carbs this week. Disp # 5

## 2015-03-05 NOTE — Telephone Encounter (Signed)
EOB for labwork DOS 12/25/14. EOB indicates lab is out of network. I'm assuming [facility]. Pt responsible for  25% for out of network 3 charges and her portion is $12.97  Please call pt at [phone]

## 2015-03-05 NOTE — Telephone Encounter (Signed)
Sent in prednisone as instructed by PCP and called the patient informed of PCP instructions.

## 2015-03-05 NOTE — Telephone Encounter (Signed)
Pt called stating she has poison ivy for the 2nd time this summer. She said that she has tried OTC meds and they are not working. She declined scheduling appt and asked that something be called in for her. Ph # [phone].

## 2015-03-07 ENCOUNTER — Encounter: Payer: Medicare HMO | Admitting: Obstetrics & Gynecology

## 2015-03-13 ENCOUNTER — Encounter: Payer: Self-pay | Admitting: Gastroenterology

## 2015-03-13 ENCOUNTER — Ambulatory Visit (AMBULATORY_SURGERY_CENTER): Payer: Medicare HMO | Admitting: Gastroenterology

## 2015-03-13 VITALS — BP 141/76 | HR 58 | Temp 98.1°F | Resp 22 | Ht 60.0 in | Wt 191.0 lb

## 2015-03-13 DIAGNOSIS — D120 Benign neoplasm of cecum: Secondary | ICD-10-CM | POA: Diagnosis not present

## 2015-03-13 DIAGNOSIS — K219 Gastro-esophageal reflux disease without esophagitis: Secondary | ICD-10-CM | POA: Diagnosis not present

## 2015-03-13 DIAGNOSIS — D124 Benign neoplasm of descending colon: Secondary | ICD-10-CM

## 2015-03-13 DIAGNOSIS — I10 Essential (primary) hypertension: Secondary | ICD-10-CM | POA: Diagnosis not present

## 2015-03-13 DIAGNOSIS — Z86010 Personal history of colonic polyps: Secondary | ICD-10-CM

## 2015-03-13 DIAGNOSIS — D123 Benign neoplasm of transverse colon: Secondary | ICD-10-CM | POA: Diagnosis not present

## 2015-03-13 MED ORDER — SODIUM CHLORIDE 0.9 % IV SOLN: 500.0000 mL | INTRAVENOUS | Status: DC

## 2015-03-13 NOTE — Progress Notes (Signed)
Report to PACU, RN, vss, BBS= Clear.  

## 2015-03-13 NOTE — Progress Notes (Signed)
Called to room to assist during endoscopic procedure.  Patient ID and intended procedure confirmed with present staff. Received instructions for my participation in the procedure from the performing physician.  

## 2015-03-13 NOTE — Patient Instructions (Addendum)
Colon polyps removed today. Handout on polyps and diverticulosis given. Do not take Aspirin or any NSAIDS for 2 weeks. Call us with any questions or concerns. Thank you!  YOU HAD AN ENDOSCOPIC PROCEDURE TODAY AT [facility]:   Refer to the procedure report that was given to you for any specific questions about what was found during the examination.  If the procedure report does not answer your questions, please call your gastroenterologist to clarify.  If you requested that your care partner not be given the details of your procedure findings, then the procedure report has been included in a sealed envelope for you to review at your convenience later.  YOU SHOULD EXPECT: Some feelings of bloating in the abdomen. Passage of more gas than usual.  Walking can help get rid of the air that was put into your GI tract during the procedure and reduce the bloating. If you had a lower endoscopy (such as a colonoscopy or flexible sigmoidoscopy) you may notice spotting of blood in your stool or on the toilet paper. If you underwent a bowel prep for your procedure, you may not have a normal bowel movement for a few days.  Please Note:  You might notice some irritation and congestion in your nose or some drainage.  This is from the oxygen used during your procedure.  There is no need for concern and it should clear up in a day or so.  SYMPTOMS TO REPORT IMMEDIATELY:   Following lower endoscopy (colonoscopy or flexible sigmoidoscopy):  Excessive amounts of blood in the stool  Significant tenderness or worsening of abdominal pains  Swelling of the abdomen that is new, acute  Fever of 100F or higher  For urgent or emergent issues, a gastroenterologist can be reached at any hour by calling [phone].   DIET: Your first meal following the procedure should be a small meal and then it is ok to progress to your normal diet. Heavy or fried foods are harder to digest and may make you feel  nauseous or bloated.  Likewise, meals heavy in dairy and vegetables can increase bloating.  Drink plenty of fluids but you should avoid alcoholic beverages for 24 hours.  ACTIVITY:  You should plan to take it easy for the rest of today and you should NOT DRIVE or use heavy machinery until tomorrow (because of the sedation medicines used during the test).    FOLLOW UP: Our staff will call the number listed on your records the next business day following your procedure to check on you and address any questions or concerns that you may have regarding the information given to you following your procedure. If we do not reach you, we will leave a message.  However, if you are feeling well and you are not experiencing any problems, there is no need to return our call.  We will assume that you have returned to your regular daily activities without incident.  If any biopsies were taken you will be contacted by phone or by letter within the next 1-3 weeks.  Please call us at [phone] if you have not heard about the biopsies in 3 weeks.    SIGNATURES/CONFIDENTIALITY: You and/or your care partner have signed paperwork which will be entered into your electronic medical record.  These signatures attest to the fact that that the information above on your After Visit Summary has been reviewed and is understood.  Full responsibility of the confidentiality of this discharge information lies with  you and/or your care-partner.

## 2015-03-13 NOTE — Op Note (Signed)
[place]  [address] [place], [id number]   COLONOSCOPY PROCEDURE REPORT  PATIENT: [name], [name]  MR#: [id number] BIRTHDATE: [birth year] , 6  yrs. old GENDER: female ENDOSCOPIST: Yetta Flock, MD REFERRED BY: Willette Alma MD PROCEDURE DATE:  03/13/2015 PROCEDURE:   Colonoscopy with snare polypectomy, Colonoscopy with biopsy, and Colonoscopy, surveillance First Screening Colonoscopy - Avg.  risk and is 50 yrs.  old or older - No.  Prior Negative Screening - Now for repeat screening. N/A  History of Adenoma - Now for follow-up colonoscopy & has been > or = to 3 yrs.  Yes hx of adenoma.  Has been 3 or more years since last colonoscopy.  Polyps removed today? Yes ASA CLASS:   Class II INDICATIONS:Surveillance due to prior colonic neoplasia and Colorectal Neoplasm Risk Assessment for this procedure is average risk. MEDICATIONS: Propofol 350 mg IV  DESCRIPTION OF PROCEDURE:   After the risks benefits and alternatives of the procedure were thoroughly explained, informed consent was obtained.  The digital rectal exam revealed no abnormalities of the rectum.   The LB [id number]  endoscope was introduced through the anus and advanced to the cecum, which was identified by both the appendix and ileocecal valve. No adverse events experienced.   The quality of the prep was adequate  The instrument was then slowly withdrawn as the colon was fully examined. Estimated blood loss is zero unless otherwise noted in this procedure report.  COLON FINDINGS: A sessile polyp measuring 5 mm in size was found at the cecum.  A polypectomy was performed with a cold snare.  The resection was complete, the polyp tissue was completely retrieved and sent to histology.   A sessile polyp measuring diminutive mm in size was found at the ileocecal valve.  A polypectomy was performed with cold forceps.  The resection was complete, the polyp tissue was completely retrieved and  sent to histology.   A sessile polyp measuring 3 mm in size was found at the hepatic flexure.  A polypectomy was performed with cold forceps.  The resection was complete, the polyp tissue was completely retrieved and sent to histology.   Two sessile polyps measuring 3 mm in size were found in the transverse colon.  Polypectomies were performed with cold forceps.  The resection was complete, the polyp tissue was completely retrieved and sent to histology.   A sessile polyp measuring 5 mm in size was found in the descending colon.  A polypectomy was performed with a cold snare.  The resection was complete, the polyp tissue was completely retrieved and sent to histology.   There was severe diverticulosis noted in the sigmoid colon with associated muscular hypertrophy.   There was mild diverticulosis noted in the ascending colon.   The examination was otherwise normal.  Retroflexed views revealed internal hemorrhoids. The time to cecum = 2.0 Withdrawal time = 17.4   The scope was withdrawn and the procedure completed. COMPLICATIONS: There were no immediate complications.  ENDOSCOPIC IMPRESSION: 1.   Sessile polyp was found at the cecum; polypectomy was performed with a cold snare 2.   Sessile polyp measuring diminutive mm in size was found at the ileocecal valve; polypectomy was performed with cold forceps 3.   Sessile polyp was found at the hepatic flexure; polypectomy was performed with cold forceps 4.   Two sessile polyps were found in the transverse colon; polypectomies were performed with cold forceps 5.   Sessile polyp was found in the  descending colon; polypectomy was performed with a cold snare 6.   There was severe diverticulosis noted in the sigmoid colon 7.   Mild diverticulosis was noted in the ascending colon 8.   The examination was otherwise normal  RECOMMENDATIONS: 1.  Hold Aspirin and all other NSAIDS for 2 weeks. 2.  Resume diet 3.  Resume medications 4.  Await  pathology results  eSigned:  Yetta Flock, MD 03/13/2015 11:44 AM   cc: Willette Alma MD, the patient   PATIENT NAME:  Rachel, Haynes MR#: [id number]

## 2015-03-14 ENCOUNTER — Telehealth: Payer: Self-pay

## 2015-03-14 NOTE — Telephone Encounter (Signed)
  Follow up Call-  Call back number 03/13/2015  Post procedure Call Back phone  # [phone]  Permission to leave phone message Yes     Patient questions:  Do you have a fever, pain , or abdominal swelling? No. Pain Score  0 *  Have you tolerated food without any problems? Yes.    Have you been able to return to your normal activities? Yes.    Do you have any questions about your discharge instructions: Diet   No. Medications  No. Follow up visit  No.  Do you have questions or concerns about your Care? No.  Actions: * If pain score is 4 or above: No action needed, pain <4.

## 2015-03-15 NOTE — Telephone Encounter (Addendum)
Spoke with patient, [name] is denying part of her labs as "out-of-network" she has followed up with them and they are re-filing this claim. She will follow up with me when she hears back from [employer] to see what the resolution is.

## 2015-03-19 ENCOUNTER — Encounter: Payer: Self-pay | Admitting: Gastroenterology

## 2015-03-21 ENCOUNTER — Encounter: Payer: Self-pay | Admitting: Family Medicine

## 2015-03-25 ENCOUNTER — Telehealth: Payer: Self-pay | Admitting: Gastroenterology

## 2015-03-25 NOTE — Telephone Encounter (Signed)
Spoke with patient and reviewed results. Mailed patient a copy of path report.

## 2015-03-29 ENCOUNTER — Other Ambulatory Visit (HOSPITAL_COMMUNITY): Payer: Self-pay | Admitting: Obstetrics & Gynecology

## 2015-03-29 DIAGNOSIS — Z1231 Encounter for screening mammogram for malignant neoplasm of breast: Secondary | ICD-10-CM

## 2015-04-01 ENCOUNTER — Encounter: Payer: Self-pay | Admitting: Obstetrics & Gynecology

## 2015-04-01 ENCOUNTER — Ambulatory Visit (INDEPENDENT_AMBULATORY_CARE_PROVIDER_SITE_OTHER): Payer: Medicare HMO | Admitting: Obstetrics & Gynecology

## 2015-04-01 ENCOUNTER — Ambulatory Visit (HOSPITAL_BASED_OUTPATIENT_CLINIC_OR_DEPARTMENT_OTHER)
Admission: RE | Admit: 2015-04-01 | Discharge: 2015-04-01 | Disposition: A | Discharge status: Home / Self Care | Payer: Medicare HMO | Source: Ambulatory Visit | Attending: Obstetrics & Gynecology | Admitting: Obstetrics & Gynecology

## 2015-04-01 VITALS — BP 139/74 | HR 66 | Ht 60.0 in | Wt 193.0 lb

## 2015-04-01 DIAGNOSIS — Z1231 Encounter for screening mammogram for malignant neoplasm of breast: Secondary | ICD-10-CM

## 2015-04-01 DIAGNOSIS — Z01419 Encounter for gynecological examination (general) (routine) without abnormal findings: Secondary | ICD-10-CM

## 2015-04-01 DIAGNOSIS — Z0000 Encounter for general adult medical examination without abnormal findings: Secondary | ICD-10-CM

## 2015-04-01 NOTE — Progress Notes (Signed)
Subjective:    [name] is a 64 y.o. DW G0 female who presents for an annual exam. The patient has no complaints today. The patient is not currently sexually active. GYN screening history: last pap: was normal. The patient wears seatbelts: yes. The patient participates in regular exercise: no. Has the patient ever been transfused or tattooed?: yes. The patient reports that there is not domestic violence in her life.   Menstrual History: OB History    No data available      Menarche age: 73  No LMP recorded. Patient is postmenopausal.    The following portions of the patient's history were reviewed and updated as appropriate: allergies, current medications, past family history, past medical history, past social history, past surgical history and problem list.  Review of Systems Pertinent items noted in HPI and remainder of comprehensive ROS otherwise negative.   She declines flu vaccine. S/P colonoscopy this month, polyps.   Objective:    BP 139/74 mmHg  Pulse 66  Ht 5' (1.524 m)  Wt 193 lb (87.544 kg)  BMI 37.69 kg/m2  General Appearance:    Alert, cooperative, no distress, appears stated age  Head:    Normocephalic, without obvious abnormality, atraumatic  Eyes:    PERRL, conjunctiva/corneas clear, EOM's intact, fundi    benign, both eyes  Ears:    Normal TM's and external ear canals, both ears  Nose:   Nares normal, septum midline, mucosa normal, no drainage    or sinus tenderness  Throat:   Lips, mucosa, and tongue normal; teeth and gums normal  Neck:   Supple, symmetrical, trachea midline, no adenopathy;    thyroid:  no enlargement/tenderness/nodules; no carotid   bruit or JVD  Back:     Symmetric, no curvature, ROM normal, no CVA tenderness  Lungs:     Clear to auscultation bilaterally, respirations unlabored  Chest Wall:    No tenderness or deformity   Heart:    Regular rate and rhythm, S1 and S2 normal, no murmur, rub   or gallop  Breast Exam:    No tenderness,  masses, or nipple abnormality  Abdomen:     Soft, non-tender, bowel sounds active all four quadrants,    no masses, no organomegaly  Genitalia:    Normal female without lesion, discharge or tenderness., nulliparous cervix, No pelvic masses     Extremities:   Extremities normal, atraumatic, no cyanosis or edema  Pulses:   2+ and symmetric all extremities  Skin:   Skin color, texture, turgor normal, no rashes or lesions  Lymph nodes:   Cervical, supraclavicular, and axillary nodes normal  Neurologic:   CNII-XII intact, normal strength, sensation and reflexes    throughout  .    Assessment:    Healthy female exam.    Plan:     Breast self exam technique reviewed and patient encouraged to perform self-exam monthly.   Mammogram today

## 2015-04-17 ENCOUNTER — Encounter: Payer: Self-pay | Admitting: Obstetrics & Gynecology

## 2015-08-12 ENCOUNTER — Encounter: Payer: Self-pay | Admitting: Family Medicine

## 2015-08-27 DIAGNOSIS — R69 Illness, unspecified: Secondary | ICD-10-CM | POA: Diagnosis not present

## 2015-09-12 DIAGNOSIS — L905 Scar conditions and fibrosis of skin: Secondary | ICD-10-CM | POA: Diagnosis not present

## 2015-09-12 DIAGNOSIS — L570 Actinic keratosis: Secondary | ICD-10-CM | POA: Diagnosis not present

## 2015-09-12 DIAGNOSIS — L853 Xerosis cutis: Secondary | ICD-10-CM | POA: Diagnosis not present

## 2015-09-12 DIAGNOSIS — D1801 Hemangioma of skin and subcutaneous tissue: Secondary | ICD-10-CM | POA: Diagnosis not present

## 2015-12-15 ENCOUNTER — Encounter: Payer: Self-pay | Admitting: Family Medicine

## 2015-12-17 ENCOUNTER — Other Ambulatory Visit: Payer: Self-pay | Admitting: Family Medicine

## 2015-12-17 MED ORDER — CIPROFLOXACIN HCL 500 MG PO TABS: 500.0000 mg | ORAL_TABLET | Freq: Two times a day (BID) | ORAL | Status: DC

## 2015-12-17 MED ORDER — METRONIDAZOLE 500 MG PO TABS: 500.0000 mg | ORAL_TABLET | Freq: Three times a day (TID) | ORAL | Status: DC

## 2015-12-31 ENCOUNTER — Encounter: Payer: Self-pay | Admitting: Family Medicine

## 2016-02-04 ENCOUNTER — Encounter: Payer: Self-pay | Admitting: *Deleted

## 2016-02-04 ENCOUNTER — Ambulatory Visit (INDEPENDENT_AMBULATORY_CARE_PROVIDER_SITE_OTHER): Payer: Medicare HMO | Admitting: *Deleted

## 2016-02-04 VITALS — BP 140/78 | HR 68 | Resp 16 | Ht 60.0 in | Wt 193.6 lb

## 2016-02-04 DIAGNOSIS — E785 Hyperlipidemia, unspecified: Secondary | ICD-10-CM

## 2016-02-04 DIAGNOSIS — I10 Essential (primary) hypertension: Secondary | ICD-10-CM

## 2016-02-04 DIAGNOSIS — E739 Lactose intolerance, unspecified: Secondary | ICD-10-CM | POA: Diagnosis not present

## 2016-02-04 DIAGNOSIS — Z0000 Encounter for general adult medical examination without abnormal findings: Secondary | ICD-10-CM

## 2016-02-04 LAB — COMPREHENSIVE METABOLIC PANEL
ALBUMIN: 4.2 g/dL (ref 3.5–5.2)
ALT: 18 U/L (ref 0–35)
AST: 20 U/L (ref 0–37)
Alkaline Phosphatase: 43 U/L (ref 39–117)
BUN: 27 mg/dL — ABNORMAL HIGH (ref 6–23)
CALCIUM: 8.9 mg/dL (ref 8.4–10.5)
CHLORIDE: 104 meq/L (ref 96–112)
CO2: 30 mEq/L (ref 19–32)
Creatinine, Ser: 1.07 mg/dL (ref 0.40–1.20)
GFR: 54.26 mL/min — ABNORMAL LOW (ref >60.00)
Glucose, Bld: 101 mg/dL — ABNORMAL HIGH (ref 70–99)
POTASSIUM: 4.1 meq/L (ref 3.5–5.1)
Sodium: 139 mEq/L (ref 135–145)
Total Bilirubin: 0.4 mg/dL (ref 0.2–1.2)
Total Protein: 7.1 g/dL (ref 6.0–8.3)

## 2016-02-04 LAB — LIPID PANEL
CHOL/HDL RATIO: 4
Cholesterol: 174 mg/dL (ref 0–200)
HDL: 41.5 mg/dL (ref >39.00)
LDL CALC: 105 mg/dL — AB (ref 0–99)
NonHDL: 132.6
TRIGLYCERIDES: 139 mg/dL (ref 0.0–149.0)
VLDL: 27.8 mg/dL (ref 0.0–40.0)

## 2016-02-04 LAB — CBC
HCT: 39.1 % (ref 36.0–46.0)
Hemoglobin: 13.4 g/dL (ref 12.0–15.0)
MCHC: 34.1 g/dL (ref 30.0–36.0)
MCV: 89.7 fl (ref 78.0–100.0)
Platelets: 230 10*3/uL (ref 150.0–400.0)
RBC: 4.36 Mil/uL (ref 3.87–5.11)
RDW: 13.4 % (ref 11.5–15.5)
WBC: 5.2 10*3/uL (ref 4.0–10.5)

## 2016-02-04 LAB — HEMOGLOBIN A1C: Hgb A1c MFr Bld: 5.8 % (ref 4.6–6.5)

## 2016-02-04 LAB — TSH: TSH: 1.75 u[IU]/mL (ref 0.35–4.50)

## 2016-02-04 NOTE — Patient Instructions (Signed)
Please go to the lab for your bloodwork.  Eat heart healthy diet (full of fruits, vegetables, whole grains, lean protein, water--limit salt, fat, and sugar intake) and increase physical activity as tolerated.  Continue doing brain stimulating activities (puzzles, reading, adult coloring books, staying active) to keep memory sharp.   Follow-up with Dr. Charlett Blake as scheduled. Bring a copy of your advanced directives to your next office visit.

## 2016-02-04 NOTE — Progress Notes (Addendum)
Subjective:   [name] is a 65 y.o. female who presents for Medicare Annual (Subsequent) preventive examination.  Review of Systems:  No ROS.  Medicare Wellness Visit.  Cardiac Risk Factors include: advanced age (>97men, >75 women);dyslipidemia;hypertension;obesity (BMI >30kg/m2);sedentary lifestyle  Sleep patterns: Sleeps restlessly. Gets up 5x nightly to void. Usually wakes up feeling rested.   Home Safety/Smoke Alarms:  Lives alone. Feels safe in home. Has smoke alarms and security system in home. Living environment; residence and Firearm Safety: Stored in a safe place.   Seat Belt Safety/Bike Helmet: Wears seat belt.    Counseling:   Eye Exam- [facility] on [address] yearly or as needed Dental- Dr. Sabino Gasser every year  Female:   Pap- N/A       Mammo- 04/01/15 BI-RADS CATEGORY  1: Negative; pt screens every 2 years.       Dexa scan- 08/30/13 Normal, 3-5 year follow-up per recommendations. Plans to have repeat Dexa w/ MMG next year (2018)       CCS- 03/13/15 w/ Dr. Havery Moros; multiple polyps, diverticulosis, 3 year recall      Objective:     Vitals: BP 140/78 (BP Location: Left Arm, Patient Position: Sitting, Cuff Size: Normal)   Pulse 68   Resp 16   Ht 5' (1.524 m)   Wt 193 lb 9.6 oz (87.8 kg)   SpO2 95%   BMI 37.81 kg/m   Body mass index is 37.81 kg/m.   Tobacco History  Smoking Status  . Former Smoker  . Types: Cigarettes  . Quit date: 06/08/1982  Smokeless Tobacco  . Never Used     Counseling given: Not Answered   Past Medical History:  Diagnosis Date  . Arthritis   . Benign paroxysmal positional vertigo 11/14/2013  . Depression   . Essential hypertension, benign   . GERD (gastroesophageal reflux disease) 09/02/2013  . Glaucoma   . Glucose intolerance (impaired glucose tolerance)   . H/O tobacco use, presenting hazards to health 09/02/2013  . History  of basal cell carcinoma   . History of cardiac arrhythmia   . Hyperlipidemia   . Medicare  welcome visit 11/28/2012  . Personal history of colonic polyps 09/02/2013   Patient reports H/O 2 colonoscopies, last one dates back to 2012 in [place] She reports they were labeled as incomplete.  Reports 1st colonoscopy had 1 benign polyp and the 2nd was clear.    Past Surgical History:  Procedure Laterality Date  . CERVICAL CONE BIOPSY  1960s  . DILATION AND CURETTAGE OF UTERUS  1966  . KNEE SURGERY Left 11/01/2008   left knee total replacement Dr Sharol Given   Family History  Problem Relation Age of Onset  . Coronary artery disease Paternal Grandmother   . Hyperlipidemia Paternal Grandmother   . Hypertension Paternal Grandmother   . Stroke Paternal Grandmother   . Hypertension Mother   . Lung cancer Mother     smoker  . Uterine cancer Mother   . Birth defects Mother     lung cancer in smoker  . COPD Mother   . Hypertension Father   . Kidney disease Father     Renal Cell Carcinoma  . Birth defects Father     renal cell CA, smoker  . Hypertension Maternal Grandmother   . Hypertension Maternal Grandfather   . Hypertension Paternal Grandfather   . Dementia Paternal Grandfather   . Cancer Brother     testicular  . Heart disease Brother  NI, smoker  . Hyperlipidemia    . Colon cancer Neg Hx    History  Sexual Activity  . Sexual activity: No    Comment: lives alone, no dietary restrictions except no lactose,    Outpatient Encounter Prescriptions as of 02/04/2016  Medication Sig  . aspirin 81 MG tablet Take 81 mg by mouth daily.    . fish oil-omega-3 fatty acids 1000 MG capsule Take 2 g by mouth 2 (two) times daily.  . hydrochlorothiazide (MICROZIDE) 12.5 MG capsule Take 1 capsule (12.5 mg total) by mouth daily.  Marland Kitchen ibuprofen (ADVIL,MOTRIN) 200 MG tablet Take 200 mg by mouth every 6 (six) hours as needed for pain.  Marland Kitchen lovastatin (MEVACOR) 20 MG tablet Take 2 tablets (40 mg total) by mouth at bedtime.  . metoprolol (LOPRESSOR) 50 MG tablet Take 1 tablet (50 mg total) by mouth 2 (two)  times daily.  . Multiple Vitamin (MULTIVITAMIN) tablet Take 1 tablet by mouth daily.    . Probiotic Product (PROBIOTIC-10 PO) Take 1 capsule by mouth daily.  Marland Kitchen tretinoin (RETIN-A) 0.1 % cream Apply 1 application topically at bedtime. Apply pea-sized amount topically to face at bedtime.  . [DISCONTINUED] ciprofloxacin (CIPRO) 500 MG tablet Take 1 tablet (500 mg total) by mouth 2 (two) times daily. Take for 7 days (Patient not taking: Reported on 02/04/2016)  . [DISCONTINUED] metroNIDAZOLE (FLAGYL) 500 MG tablet Take 1 tablet (500 mg total) by mouth 3 (three) times daily. Take for 7 days. (Patient not taking: Reported on 02/04/2016)  . [DISCONTINUED] predniSONE (DELTASONE) 20 MG tablet Take 1 tablet (20 mg total) by mouth daily. (Patient not taking: Reported on 02/04/2016)   No facility-administered encounter medications on file as of 02/04/2016.     Activities of Daily Living In your present state of health, do you have any difficulty performing the following activities: 02/04/2016  Hearing? N  Vision? N  Difficulty concentrating or making decisions? N  Walking or climbing stairs? N  Dressing or bathing? N  Doing errands, shopping? N  Preparing Food and eating ? N  Using the Toilet? N  Managing your Medications? N  Managing your Finances? N  Housekeeping or managing your Housekeeping? N  Some recent data might be hidden    Patient Care Team: Mosie Lukes, MD as PCP - General (Family Medicine) Rolm Bookbinder, MD as Consulting Physician (Dermatology) Emily Filbert, MD as Consulting Physician (Obstetrics and Gynecology)    Assessment:    Physical assessment deferred to PCP.  Exercise Activities and Dietary recommendations Current Exercise Habits: The patient does not participate in regular exercise at present  Diet (meal preparation, eat out, water intake, caffeinated beverages, dairy products, fruits and vegetables): Able to prepare meals. Usually eats at home. 2 meals daily. Drinks 1-2  cups coffee daily. Drinks lots of water.  Dinner last night: Fried fish and onion rings     Goals    . Reduce calorie intake to 2000 calories per day      Fall Risk Fall Risk  02/04/2016 12/25/2014 09/02/2013  Falls in the past year? Yes No No  Number falls in past yr: 1 - -  Injury with Fall? No - -   Depression Screen PHQ 2/9 Scores 02/04/2016 12/25/2014 09/02/2013  PHQ - 2 Score 0 0 0     Cognitive Testing MMSE - Mini Mental State Exam 02/04/2016  Orientation to time 5  Orientation to Place 5  Registration 3  Attention/ Calculation 5  Recall 3  Language- name 2 objects 2  Language- repeat 1  Language- follow 3 step command 3  Language- read & follow direction 1  Write a sentence 1  Copy design 1  Total score 30    Immunization History  Administered Date(s) Administered  . Pneumococcal Conjugate-13 08/28/2013  . Pneumococcal Polysaccharide-23 10/16/2008, 12/25/2014  . Tdap 11/28/2012  . Zoster 12/02/2012   Screening Tests Health Maintenance  Topic Date Due  . INFLUENZA VACCINE  03/09/2016 (Originally 01/07/2016)  . Hepatitis C Screening  02/03/2017 (Originally [birth year])  . MAMMOGRAM  03/31/2017  . COLONOSCOPY  03/12/2018  . TETANUS/TDAP  11/29/2022  . DEXA SCAN  Completed  . ZOSTAVAX  Completed  . PNA vac Low Risk Adult  Completed      Plan:    Eat heart healthy diet (full of fruits, vegetables, whole grains, lean protein, water--limit salt, fat, and sugar intake) and increase physical activity as tolerated.  Continue doing brain stimulating activities (puzzles, reading, adult coloring books, staying active) to keep memory sharp.   Follow-up with Dr. Charlett Blake as scheduled. Bring a copy of your advanced directives to your next office visit.  During the course of the visit the patient was educated and counseled about the following appropriate screening and preventive services:   Vaccines to include Pneumoccal, Influenza, Hepatitis B, Td, Zostavax,  HCV  Cardiovascular Disease  Colorectal cancer screening  Bone density screening  Diabetes screening  Glaucoma screening  Mammography/PAP  Nutrition counseling   Patient Instructions (the written plan) was given to the patient.   Dorrene German, RN  02/04/2016  RN AWV note reviewed. Agree with documention and plan.  Penni Homans, MD

## 2016-02-04 NOTE — Progress Notes (Signed)
Pre visit review using our clinic review tool, if applicable. No additional management support is needed unless otherwise documented below in the visit note. 

## 2016-02-05 LAB — HEPATITIS C ANTIBODY: HCV Ab: NEGATIVE

## 2016-02-11 ENCOUNTER — Encounter: Payer: Self-pay | Admitting: Family Medicine

## 2016-02-11 ENCOUNTER — Ambulatory Visit (INDEPENDENT_AMBULATORY_CARE_PROVIDER_SITE_OTHER): Payer: Medicare HMO | Admitting: Family Medicine

## 2016-02-11 ENCOUNTER — Encounter: Payer: Medicare HMO | Admitting: Family Medicine

## 2016-02-11 VITALS — BP 134/79 | HR 74 | Temp 97.5°F | Resp 16 | Ht 59.75 in | Wt 192.4 lb

## 2016-02-11 DIAGNOSIS — R252 Cramp and spasm: Secondary | ICD-10-CM | POA: Diagnosis not present

## 2016-02-11 NOTE — Patient Instructions (Signed)
Drink 3 oz tonic water at bedtime and every morning. Take one OTC magnesium (400 mg) pill once a day. Drink a gatorade or poweraid once daily.

## 2016-02-11 NOTE — Progress Notes (Signed)
OFFICE VISIT  02/11/2016   CC:  Chief Complaint  Patient presents with  . Leg Swelling    sharp pain left leg  . Annual Exam    Medicare   HPI:    Patient is a 65 y.o. Caucasian female who presents for recurrent lower leg cramps. Right leg x months initially.  These went away for a few months, then 3 d/a started getting L calf cramps---frequent on/off. She elevated her leg, tried walking, stretching--none of this works. Rarely takes ibuprofen.  Usually drinks plenty of water.  Other drinks: 2 cups coffee per morning.  Glass of wine 3-4 times per week.  No leg swelling.  No abnormal sensation in leg when she is not having a cramp. No hx of DVT.  No recent leg trauma.  No recent new meds.   Past Medical History:  Diagnosis Date  . Arthritis   . Benign paroxysmal positional vertigo 11/14/2013  . Depression   . Essential hypertension, benign   . GERD (gastroesophageal reflux disease) 09/02/2013  . Glaucoma   . Glucose intolerance (impaired glucose tolerance)   . H/O tobacco use, presenting hazards to health 09/02/2013  . History  of basal cell carcinoma   . History of cardiac arrhythmia   . Hyperlipidemia   . Medicare welcome visit 11/28/2012  . Personal history of colonic polyps 09/02/2013   Patient reports H/O 2 colonoscopies, last one dates back to 2012 in [place] She reports they were labeled as incomplete.  Reports 1st colonoscopy had 1 benign polyp and the 2nd was clear.     Past Surgical History:  Procedure Laterality Date  . CERVICAL CONE BIOPSY  1960s  . DILATION AND CURETTAGE OF UTERUS  1966  . KNEE SURGERY Left 11/01/2008   left knee total replacement Dr Sharol Given    Outpatient Medications Prior to Visit  Medication Sig Dispense Refill  . aspirin 81 MG tablet Take 81 mg by mouth daily.      . fish oil-omega-3 fatty acids 1000 MG capsule Take 2 g by mouth 2 (two) times daily.    . hydrochlorothiazide (MICROZIDE) 12.5 MG capsule Take 1 capsule (12.5 mg total) by mouth daily. 90  capsule 3  . ibuprofen (ADVIL,MOTRIN) 200 MG tablet Take 200 mg by mouth every 6 (six) hours as needed for pain.    Marland Kitchen lovastatin (MEVACOR) 20 MG tablet Take 2 tablets (40 mg total) by mouth at bedtime. 180 tablet 3  . metoprolol (LOPRESSOR) 50 MG tablet Take 1 tablet (50 mg total) by mouth 2 (two) times daily. 180 tablet 3  . Multiple Vitamin (MULTIVITAMIN) tablet Take 1 tablet by mouth daily.      . Probiotic Product (PROBIOTIC-10 PO) Take 1 capsule by mouth daily.    Marland Kitchen tretinoin (RETIN-A) 0.1 % cream Apply 1 application topically at bedtime. Apply pea-sized amount topically to face at bedtime. 45 g 5   No facility-administered medications prior to visit.     No Known Allergies  ROS As per HPI  PE: Blood pressure 134/79, pulse 74, temperature 97.5 F (36.4 C), resp. rate 16, height 4' 11.75" (1.518 m), weight 192 lb 6.4 oz (87.3 kg), SpO2 95 %. Gen: Alert, well appearing.  Patient is oriented to person, place, time, and situation. Lower legs without tenderness, cord, or nodule or rash. No calf swelling or asymmetry.  Homan's sign neg bilat.  LABS:    Chemistry      Component Value Date/Time   NA 139 02/04/2016 1114  K 4.1 02/04/2016 1114   CL 104 02/04/2016 1114   CO2 30 02/04/2016 1114   BUN 27 (H) 02/04/2016 1114   CREATININE 1.07 02/04/2016 1114   CREATININE 1.00 01/26/2014 1438      Component Value Date/Time   CALCIUM 8.9 02/04/2016 1114   ALKPHOS 43 02/04/2016 1114   AST 20 02/04/2016 1114   ALT 18 02/04/2016 1114   BILITOT 0.4 02/04/2016 1114       IMPRESSION AND PLAN:  Muscle cramps, lower legs. No suspicious pathology found on exam or suspected from patient's history. Reassured patient.   Instructions: Drink 3 oz tonic water at bedtime and every morning. Take one OTC magnesium (400 mg) pill once a day. Drink a gatorade or poweraid once daily.  An After Visit Summary was printed and given to the patient.  FOLLOW UP: Return if symptoms worsen or fail to  improve.  Pt was instructed to make appt with her PCP for her Medicare CPE.  Signed:  Crissie Sickles, MD           02/11/2016

## 2016-02-13 ENCOUNTER — Encounter: Payer: Self-pay | Admitting: Family Medicine

## 2016-02-14 ENCOUNTER — Encounter: Payer: Self-pay | Admitting: Family Medicine

## 2016-02-18 ENCOUNTER — Other Ambulatory Visit: Payer: Self-pay | Admitting: Family Medicine

## 2016-02-18 DIAGNOSIS — E739 Lactose intolerance, unspecified: Secondary | ICD-10-CM

## 2016-02-18 DIAGNOSIS — E785 Hyperlipidemia, unspecified: Secondary | ICD-10-CM

## 2016-02-18 DIAGNOSIS — I10 Essential (primary) hypertension: Secondary | ICD-10-CM

## 2016-03-24 ENCOUNTER — Other Ambulatory Visit: Payer: Self-pay | Admitting: Family Medicine

## 2016-03-24 DIAGNOSIS — I10 Essential (primary) hypertension: Secondary | ICD-10-CM

## 2016-03-24 DIAGNOSIS — E785 Hyperlipidemia, unspecified: Secondary | ICD-10-CM

## 2016-03-24 DIAGNOSIS — E739 Lactose intolerance, unspecified: Secondary | ICD-10-CM

## 2016-04-24 ENCOUNTER — Encounter: Payer: Self-pay | Admitting: Family Medicine

## 2016-05-08 ENCOUNTER — Encounter: Payer: Self-pay | Admitting: Family Medicine

## 2016-07-03 ENCOUNTER — Encounter: Payer: Self-pay | Admitting: Family Medicine

## 2016-07-05 ENCOUNTER — Encounter: Payer: Self-pay | Admitting: Family Medicine

## 2016-07-05 ENCOUNTER — Telehealth: Payer: Medicare HMO | Admitting: Physician Assistant

## 2016-07-05 DIAGNOSIS — B9689 Other specified bacterial agents as the cause of diseases classified elsewhere: Secondary | ICD-10-CM

## 2016-07-05 DIAGNOSIS — J019 Acute sinusitis, unspecified: Secondary | ICD-10-CM

## 2016-07-05 MED ORDER — BENZONATATE 100 MG PO CAPS: 100.0000 mg | ORAL_CAPSULE | Freq: Two times a day (BID) | ORAL | 0 refills | Status: DC | PRN

## 2016-07-05 MED ORDER — AMOXICILLIN-POT CLAVULANATE 875-125 MG PO TABS: 1.0000 | ORAL_TABLET | Freq: Two times a day (BID) | ORAL | 0 refills | Status: DC

## 2016-07-05 NOTE — Progress Notes (Signed)
We are sorry that you are not feeling well.  Here is how we plan to help!  Based on what you have shared with me it looks like you have sinusitis.  Sinusitis is inflammation and infection in the sinus cavities of the head.  Based on your presentation I believe you most likely have Acute Bacterial Sinusitis.  This is an infection caused by bacteria and is treated with antibiotics. I have prescribed Augmentin 875mg /125mg  one tablet twice daily with food, for 7 days. You may use an oral decongestant such as Mucinex D or if you have glaucoma or high blood pressure use plain Mucinex. Saline nasal spray help and can safely be used as often as needed for congestion.  If you develop worsening sinus pain, fever or notice severe headache and vision changes, or if symptoms are not better after completion of antibiotic, please schedule an appointment with a health care provider.    I have sent in a prescription cough medication as well called Tessalon. May make you drowsy so do not drive while taking medication.  Sinus infections are not as easily transmitted as other respiratory infection, however we still recommend that you avoid close contact with loved ones, especially the very young and elderly.  Remember to wash your hands thoroughly throughout the day as this is the number one way to prevent the spread of infection!  Home Care:  Only take medications as instructed by your medical team.  Complete the entire course of an antibiotic.  Do not take these medications with alcohol.  A steam or ultrasonic humidifier can help congestion.  You can place a towel over your head and breathe in the steam from hot water coming from a faucet.  Avoid close contacts especially the very young and the elderly.  Cover your mouth when you cough or sneeze.  Always remember to wash your hands.  Get Help Right Away If:  You develop worsening fever or sinus pain.  You develop a severe head ache or visual  changes.  Your symptoms persist after you have completed your treatment plan.  Make sure you  Understand these instructions.  Will watch your condition.  Will get help right away if you are not doing well or get worse.  Your e-visit answers were reviewed by a board certified advanced clinical practitioner to complete your personal care plan.  Depending on the condition, your plan could have included both over the counter or prescription medications.  If there is a problem please reply  once you have received a response from your provider.  Your safety is important to [place].  If you have drug allergies check your prescription carefully.    You can use MyChart to ask questions about today's visit, request a non-urgent call back, or ask for a work or school excuse for 24 hours related to this e-Visit. If it has been greater than 24 hours you will need to follow up with your provider, or enter a new e-Visit to address those concerns.  You will get an e-mail in the next two days asking about your experience.  I hope that your e-visit has been valuable and will speed your recovery. Thank you for using e-visits.

## 2016-07-06 ENCOUNTER — Encounter: Payer: Self-pay | Admitting: Family Medicine

## 2016-08-11 ENCOUNTER — Encounter: Payer: Self-pay | Admitting: Family Medicine

## 2016-09-17 ENCOUNTER — Other Ambulatory Visit: Payer: Self-pay | Admitting: Family Medicine

## 2016-09-17 ENCOUNTER — Encounter: Payer: Self-pay | Admitting: Family Medicine

## 2016-09-17 DIAGNOSIS — R0681 Apnea, not elsewhere classified: Secondary | ICD-10-CM

## 2016-10-01 ENCOUNTER — Ambulatory Visit (INDEPENDENT_AMBULATORY_CARE_PROVIDER_SITE_OTHER): Payer: Medicare HMO | Admitting: Pulmonary Disease

## 2016-10-01 ENCOUNTER — Encounter: Payer: Self-pay | Admitting: Pulmonary Disease

## 2016-10-01 VITALS — BP 132/78 | HR 66 | Ht 60.0 in | Wt 195.0 lb

## 2016-10-01 DIAGNOSIS — G4730 Sleep apnea, unspecified: Secondary | ICD-10-CM

## 2016-10-01 DIAGNOSIS — R0683 Snoring: Secondary | ICD-10-CM

## 2016-10-01 HISTORY — DX: Sleep apnea, unspecified: G47.30

## 2016-10-01 NOTE — Progress Notes (Signed)
Subjective:    Patient ID: [name], female    DOB: 1950/07/10, 66 y.o.   MRN: [id number]  HPI   Chief Complaint  Patient presents with  . sleep consult    Referred by Dr. Charlett Blake for pt reported witnessed sleep apneas. Epworth score: 31.     66 year old woman presents for evaluation of sleep-disordered breathing. She is a retired Product manager. She was on vacation with a girlfriend who noted extremely loud snoring and witnessed apneas. She reports loud snoring for several years and irregular breathing in her sleep. About twice a month she wakes up gasping from her sleep sits up takes deep breaths per minute going back to sleep. She reports frequent nocturia for many years and wonders if this is related to conditioning from bedwetting as a child. Epworth sleepiness score is 3 and she denies daytime naps or excessive daytime somnolence. Bedtime is between 9 and 10 PM, sleep latency is minimal, she sleeps on her side with one pillow and has 2 pillows on her side, reports 4-7 nocturnal awakenings including nocturia and is out of bed by 8:30 AM feeling refreshed without headaches with occasional dryness of mouth. Her weight has not changed in recent years  Hypertension is well controlled on thiazide hence his hyperlipidemia. She is a remote smoker and quit in 1984 and she drinks 2-3 glasses of wine about 4-5 times a week and does not feel that her symptoms are changed by this  There is no history suggestive of cataplexy, sleep paralysis or parasomnias    Past Medical History:  Diagnosis Date  . Arthritis   . Benign paroxysmal positional vertigo 11/14/2013  . Depression   . Essential hypertension, benign   . GERD (gastroesophageal reflux disease) 09/02/2013  . Glaucoma   . Glucose intolerance (impaired glucose tolerance)   . H/O tobacco use, presenting hazards to health 09/02/2013  . History  of basal cell carcinoma   . History of cardiac arrhythmia   . Hyperlipidemia   .  Medicare welcome visit 11/28/2012  . Personal history of colonic polyps 09/02/2013   Patient reports H/O 2 colonoscopies, last one dates back to 2012 in [place] She reports they were labeled as incomplete.  Reports 1st colonoscopy had 1 benign polyp and the 2nd was clear.     Past Surgical History:  Procedure Laterality Date  . CERVICAL CONE BIOPSY  1960s  . DILATION AND CURETTAGE OF UTERUS  1966  . KNEE SURGERY Left 11/01/2008   left knee total replacement Dr Sharol Given    No Known Allergies   Social History   Social History  . Marital status: Single    Spouse name: N/A  . Number of children: N/A  . Years of education: N/A   Occupational History  . Not on file.   Social History Main Topics  . Smoking status: Former Smoker    Packs/day: 1.00    Years: 15.00    Types: Cigarettes    Quit date: 06/08/1982  . Smokeless tobacco: Never Used  . Alcohol use Yes     Comment: wine w/ dinner 'once in a while'  . Drug use: No  . Sexual activity: No     Comment: lives alone, no dietary restrictions except no lactose,   Other Topics Concern  . Not on file   Social History Narrative   Occupation: unemployed Product manager- now retired   Divorced   no children    Moved from [place]  alone with 4 cats   Enjoys gardening, planting.              Family History  Problem Relation Age of Onset  . Coronary artery disease Paternal Grandmother   . Hyperlipidemia Paternal Grandmother   . Hypertension Paternal Grandmother   . Stroke Paternal Grandmother   . Hypertension Mother   . Lung cancer Mother     smoker  . Uterine cancer Mother   . Birth defects Mother     lung cancer in smoker  . COPD Mother   . Hypertension Father   . Kidney disease Father     Renal Cell Carcinoma  . Birth defects Father     renal cell CA, smoker  . Hypertension Maternal Grandmother   . Hypertension Maternal Grandfather   . Hypertension Paternal Grandfather   . Dementia Paternal Grandfather     . Cancer Brother     testicular  . Heart disease Brother     NI, smoker  . Hyperlipidemia    . Colon cancer Neg Hx       Review of Systems  Constitutional: Negative for fever and unexpected weight change.  HENT: Negative for congestion, dental problem, ear pain, nosebleeds, postnasal drip, rhinorrhea, sinus pressure, sneezing, sore throat and trouble swallowing.   Eyes: Negative for redness and itching.  Respiratory: Positive for shortness of breath. Negative for cough, chest tightness and wheezing.   Cardiovascular: Negative for palpitations and leg swelling.  Gastrointestinal: Negative for nausea and vomiting.  Genitourinary: Negative for dysuria.  Musculoskeletal: Negative for joint swelling.  Skin: Negative for rash.  Neurological: Negative for headaches.  Hematological: Does not bruise/bleed easily.  Psychiatric/Behavioral: Positive for sleep disturbance. Negative for dysphoric mood. The patient is not nervous/anxious.        Objective:   Physical Exam   Gen. Pleasant, obese, in no distress, normal affect ENT - no lesions, no post nasal drip, class 2 airway Neck: No JVD, no thyromegaly, no carotid bruits Lungs: no use of accessory muscles, no dullness to percussion, decreased without rales or rhonchi  Cardiovascular: Rhythm regular, heart sounds  normal, no murmurs or gallops, no peripheral edema Abdomen: soft and non-tender, no hepatosplenomegaly, BS normal. Musculoskeletal: No deformities, no cyanosis or clubbing Neuro:  alert, non focal, no tremors        Assessment & Plan:

## 2016-10-01 NOTE — Patient Instructions (Signed)
Home sleep study will be scheduled 

## 2016-10-01 NOTE — Assessment & Plan Note (Signed)
Given narrow pharyngeal exam, witnessed apneas & loud snoring, obstructive sleep apnea is very likely & an overnight polysomnogram will be scheduled as a home study. The pathophysiology of obstructive sleep apnea , it's cardiovascular consequences & modes of treatment including CPAP were discused with the patient in detail & they evidenced understanding.

## 2016-10-23 DIAGNOSIS — G4733 Obstructive sleep apnea (adult) (pediatric): Secondary | ICD-10-CM | POA: Diagnosis not present

## 2016-10-28 ENCOUNTER — Encounter: Payer: Self-pay | Admitting: Pulmonary Disease

## 2016-10-28 DIAGNOSIS — R0683 Snoring: Secondary | ICD-10-CM

## 2016-10-28 NOTE — Telephone Encounter (Signed)
Dr Elsworth Soho, please advise if you have the results of the patient's most recent HST from 10/23/16. Thanks.

## 2016-10-29 ENCOUNTER — Telehealth: Payer: Self-pay | Admitting: Pulmonary Disease

## 2016-10-29 NOTE — Telephone Encounter (Signed)
Per RA, patient has severe OSA with 32 events per hours. He suggests a cpap titration study.   Notified patient via MyChart since she had asked for results there first.   Will place the order for the CPAP titration once I hear back from patient.

## 2016-10-30 ENCOUNTER — Other Ambulatory Visit: Payer: Self-pay | Admitting: *Deleted

## 2016-10-30 DIAGNOSIS — R0683 Snoring: Secondary | ICD-10-CM

## 2016-10-30 DIAGNOSIS — G4733 Obstructive sleep apnea (adult) (pediatric): Secondary | ICD-10-CM | POA: Diagnosis not present

## 2016-11-06 NOTE — Telephone Encounter (Signed)
Spoke with patient, states that she wants to move forward with CPAP Titration. Order placed and patient is aware that someone will be in touch. Pt is requesting that we specify in the order that she wants to try the nasal pillows or something less "covering" to her face--pt states that she does not do well with having things on her face. Nothing further needed.

## 2016-11-06 NOTE — Telephone Encounter (Signed)
Patient is calling back regarding email she received.  Please call her back at [phone].  Telephone message about this is closed.

## 2016-11-18 ENCOUNTER — Other Ambulatory Visit: Payer: Self-pay | Admitting: Family Medicine

## 2016-11-18 DIAGNOSIS — E739 Lactose intolerance, unspecified: Secondary | ICD-10-CM

## 2016-11-18 DIAGNOSIS — I10 Essential (primary) hypertension: Secondary | ICD-10-CM

## 2016-11-18 DIAGNOSIS — E785 Hyperlipidemia, unspecified: Secondary | ICD-10-CM

## 2016-11-18 NOTE — Telephone Encounter (Signed)
Last filled in 2016  Do you want to refill?

## 2016-11-19 ENCOUNTER — Other Ambulatory Visit: Payer: Self-pay | Admitting: Family Medicine

## 2016-11-19 DIAGNOSIS — E785 Hyperlipidemia, unspecified: Secondary | ICD-10-CM

## 2016-11-19 DIAGNOSIS — I10 Essential (primary) hypertension: Secondary | ICD-10-CM

## 2016-11-19 DIAGNOSIS — E739 Lactose intolerance, unspecified: Secondary | ICD-10-CM

## 2016-11-30 ENCOUNTER — Encounter (HOSPITAL_BASED_OUTPATIENT_CLINIC_OR_DEPARTMENT_OTHER): Payer: Medicare HMO

## 2017-01-05 ENCOUNTER — Encounter: Payer: Self-pay | Admitting: Family Medicine

## 2017-01-08 ENCOUNTER — Encounter: Payer: Self-pay | Admitting: Family Medicine

## 2017-01-08 ENCOUNTER — Ambulatory Visit (INDEPENDENT_AMBULATORY_CARE_PROVIDER_SITE_OTHER): Payer: Medicare HMO | Admitting: Family Medicine

## 2017-01-08 VITALS — BP 112/82 | HR 72 | Temp 97.8°F | Ht 60.0 in | Wt 195.2 lb

## 2017-01-08 DIAGNOSIS — R109 Unspecified abdominal pain: Secondary | ICD-10-CM | POA: Diagnosis not present

## 2017-01-08 NOTE — Patient Instructions (Addendum)
Try Dulcolax 1-2 times daily over the next 3-4 days. If no improvement, try using an enema. Stay well hydrated and keep lots of fiber in your diet.  Send me a MyChart message if you are not any better.  Let us know if you need anything.   Constipation, Adult Constipation is when a person has fewer bowel movements in a week than normal, has difficulty having a bowel movement, or has stools that are dry, hard, or larger than normal. Constipation may be caused by an underlying condition. It may become worse with age if a person takes certain medicines and does not take in enough fluids. Follow these instructions at home: Eating and drinking   Eat foods that have a lot of fiber, such as fresh fruits and vegetables, whole grains, and beans.  Limit foods that are high in fat, low in fiber, or overly processed, such as french fries, hamburgers, cookies, candies, and soda.  Drink enough fluid to keep your urine clear or pale yellow. General instructions  Exercise regularly or as told by your health care provider.  Go to the restroom when you have the urge to go. Do not hold it in.  Take over-the-counter and prescription medicines only as told by your health care provider. These include any fiber supplements.  Practice pelvic floor retraining exercises, such as deep breathing while relaxing the lower abdomen and pelvic floor relaxation during bowel movements.  Watch your condition for any changes.  Keep all follow-up visits as told by your health care provider. This is important. Contact a health care provider if:  You have pain that gets worse.  You have a fever.  You do not have a bowel movement after 4 days.  You vomit.  You are not hungry.  You lose weight.  You are bleeding from the anus.  You have thin, pencil-like stools. Get help right away if:  You have a fever and your symptoms suddenly get worse.  You leak stool or have blood in your stool.  Your abdomen is  bloated.  You have severe pain in your abdomen.  You feel dizzy or you faint. This information is not intended to replace advice given to you by your health care provider. Make sure you discuss any questions you have with your health care provider. Document Released: 02/21/2004 Document Revised: 12/13/2015 Document Reviewed: 11/13/2015 Elsevier Interactive Patient Education  2017 [employer].

## 2017-01-08 NOTE — Progress Notes (Signed)
Chief Complaint  Patient presents with  . Abdominal Pain    Patient is C/O abd pain since 2am today.      [name] is here for abdominal pain.  Duration: 1 day; this issue comes and goes over past 2-3 years Centrally located from top of abd "down to colon" Bleeding? No Weight loss? No Palliation: None Provocation: None Associated symptoms: some urinary frequency and dysuria Denies: fever, nausea, vomiting and inability to keep down fluids Treatment to date: None States that she will intermittently strain and have poor bowel movements. This issue started around the same time her intermittent pains started.  ROS: Constitutional: No fevers GI: No N/V/D, no bleeding + pain  Past Medical History:  Diagnosis Date  . Arthritis   . Benign paroxysmal positional vertigo 11/14/2013  . Depression   . Essential hypertension, benign   . GERD (gastroesophageal reflux disease) 09/02/2013  . Glaucoma   . Glucose intolerance (impaired glucose tolerance)   . H/O tobacco use, presenting hazards to health 09/02/2013  . History  of basal cell carcinoma   . History of cardiac arrhythmia   . Hyperlipidemia   . Medicare welcome visit 11/28/2012  . Personal history of colonic polyps 09/02/2013   Patient reports H/O 2 colonoscopies, last one dates back to 2012 in [place] She reports they were labeled as incomplete.  Reports 1st colonoscopy had 1 benign polyp and the 2nd was clear.    Family History  Problem Relation Age of Onset  . Coronary artery disease Paternal Grandmother   . Hyperlipidemia Paternal Grandmother   . Hypertension Paternal Grandmother   . Stroke Paternal Grandmother   . Hypertension Mother   . Lung cancer Mother        smoker  . Uterine cancer Mother   . Birth defects Mother        lung cancer in smoker  . COPD Mother   . Hypertension Father   . Kidney disease Father        Renal Cell Carcinoma  . Birth defects Father        renal cell CA, smoker  . Hypertension Maternal  Grandmother   . Hypertension Maternal Grandfather   . Hypertension Paternal Grandfather   . Dementia Paternal Grandfather   . Cancer Brother        testicular  . Heart disease Brother        NI, smoker  . Hyperlipidemia Unknown   . Colon cancer Neg Hx    Past Surgical History:  Procedure Laterality Date  . CERVICAL CONE BIOPSY  1960s  . DILATION AND CURETTAGE OF UTERUS  1966  . KNEE SURGERY Left 11/01/2008   left knee total replacement Dr Sharol Given    BP 112/82 (BP Location: Left Arm, Patient Position: Sitting, Cuff Size: Normal)   Pulse 72   Temp 97.8 F (36.6 C) (Oral)   Ht 5' (1.524 m)   Wt 195 lb 3.2 oz (88.5 kg)   SpO2 95%   BMI 38.12 kg/m  Gen.: Awake, alert, appears stated age 66: Mucous membranes moist without mucosal lesions Heart: Regular rate and rhythm without murmurs Lungs: Clear auscultation bilaterally, no rales or wheezing, normal effort without accessory muscle use. Abdomen: Bowel sounds are present. Abdomen is soft, TTP over lower quadrants, lightly more tender in LLQ, nondistended, no masses or organomegaly. Negative Murphy's, Rovsing's, McBurney's, and Carnett's sign. Psych: Age appropriate judgment and insight. Normal mood and affect.  Abdominal pain, unspecified abdominal location  Sounds like  constipation related. Will have her take Dulcolax for 3 days and use an enema if no better. Patient handout regarding constipation and diet tips given in AVS. No red flag signs. Constipation also explains urinary complaints. F/u prn. Pt voiced understanding and agreement to the plan.  [place], DO 01/08/17 1:16 PM

## 2017-02-02 NOTE — Progress Notes (Deleted)
Subjective:   [name] is a 66 y.o. female who presents for Medicare Annual (Subsequent) preventive examination.  Review of Systems:  No ROS.  Medicare Wellness Visit. Additional risk factors are reflected in the social history.    Sleep patterns:  Home Safety/Smoke Alarms: Feels safe in home. Smoke alarms in place.  Living environment; residence and Firearm Safety:  [place] Safety/Bike Helmet: Wears seat belt.   Female:   Pap- last 11/28/12 NEGATIVE FOR INTRAEPITHELIAL LESIONS OR MALIGNANCY.      Mammo- last 04/01/15: BI-RADS CATEGORY  1: Negative.    Dexa scan- last 09/03/13: normal       CCS- last 03/13/15: Recall 3 yrs. Pre-cancerous polyps removed     Objective:     Vitals: There were no vitals taken for this visit.  There is no height or weight on file to calculate BMI.   Tobacco History  Smoking Status  . Former Smoker  . Packs/day: 1.00  . Years: 15.00  . Types: Cigarettes  . Quit date: 06/08/1982  Smokeless Tobacco  . Never Used     Counseling given: Not Answered   Past Medical History:  Diagnosis Date  . Arthritis   . Benign paroxysmal positional vertigo 11/14/2013  . Depression   . Essential hypertension, benign   . GERD (gastroesophageal reflux disease) 09/02/2013  . Glaucoma   . Glucose intolerance (impaired glucose tolerance)   . H/O tobacco use, presenting hazards to health 09/02/2013  . History  of basal cell carcinoma   . History of cardiac arrhythmia   . Hyperlipidemia   . Medicare welcome visit 11/28/2012  . Personal history of colonic polyps 09/02/2013   Patient reports H/O 2 colonoscopies, last one dates back to 2012 in [place] She reports they were labeled as incomplete.  Reports 1st colonoscopy had 1 benign polyp and the 2nd was clear.    Past Surgical History:  Procedure Laterality Date  . CERVICAL CONE BIOPSY  1960s  . DILATION AND CURETTAGE OF UTERUS  1966  . KNEE SURGERY Left 11/01/2008   left knee total replacement Dr Sharol Given    Family History  Problem Relation Age of Onset  . Coronary artery disease Paternal Grandmother   . Hyperlipidemia Paternal Grandmother   . Hypertension Paternal Grandmother   . Stroke Paternal Grandmother   . Hypertension Mother   . Lung cancer Mother        smoker  . Uterine cancer Mother   . Birth defects Mother        lung cancer in smoker  . COPD Mother   . Hypertension Father   . Kidney disease Father        Renal Cell Carcinoma  . Birth defects Father        renal cell CA, smoker  . Hypertension Maternal Grandmother   . Hypertension Maternal Grandfather   . Hypertension Paternal Grandfather   . Dementia Paternal Grandfather   . Cancer Brother        testicular  . Heart disease Brother        NI, smoker  . Hyperlipidemia Unknown   . Colon cancer Neg Hx    History  Sexual Activity  . Sexual activity: No    Comment: lives alone, no dietary restrictions except no lactose,    Outpatient Encounter Prescriptions as of 02/04/2017  Medication Sig  . aspirin 81 MG tablet Take 81 mg by mouth daily.    . fish oil-omega-3 fatty acids 1000 MG capsule  Take 2 g by mouth daily.   . hydrochlorothiazide (MICROZIDE) 12.5 MG capsule TAKE ONE CAPSULE BY MOUTH ONCE DAILY  . ibuprofen (ADVIL,MOTRIN) 200 MG tablet Take 200 mg by mouth every 6 (six) hours as needed for pain.  Marland Kitchen lovastatin (MEVACOR) 20 MG tablet TAKE TWO TABLETS BY MOUTH AT BEDTIME  . metoprolol (LOPRESSOR) 50 MG tablet TAKE ONE TABLET BY MOUTH TWICE DAILY  . tretinoin (RETIN-A) 0.1 % cream APPLY A PEA-SIZED AMOUNT TOPICALLY TO FACE AT BEDTIME   No facility-administered encounter medications on file as of 02/04/2017.     Activities of Daily Living In your present state of health, do you have any difficulty performing the following activities: 02/04/2016  Hearing? N  Vision? N  Difficulty concentrating or making decisions? N  Walking or climbing stairs? N  Dressing or bathing? N  Doing errands, shopping? N   Preparing Food and eating ? N  Using the Toilet? N  Managing your Medications? N  Managing your Finances? N  Housekeeping or managing your Housekeeping? N  Some recent data might be hidden    Patient Care Team: Mosie Lukes, MD as PCP - General (Family Medicine) Rolm Bookbinder, MD as Consulting Physician (Dermatology) Emily Filbert, MD as Consulting Physician (Obstetrics and Gynecology)    Assessment:    Physical assessment deferred to PCP.  Exercise Activities and Dietary recommendations    Goals    . Reduce calorie intake to 2000 calories per day      Fall Risk Fall Risk  02/04/2016 12/25/2014 09/02/2013  Falls in the past year? Yes No No  Number falls in past yr: 1 - -  Comment Got up out of booth and R knee gave out. Fell and scratched eye. - -  Injury with Fall? No - -   Depression Screen PHQ 2/9 Scores 02/04/2016 12/25/2014 09/02/2013  PHQ - 2 Score 0 0 0     Cognitive Function MMSE - Mini Mental State Exam 02/04/2016  Orientation to time 5  Orientation to Place 5  Registration 3  Attention/ Calculation 5  Recall 3  Language- name 2 objects 2  Language- repeat 1  Language- follow 3 step command 3  Language- read & follow direction 1  Write a sentence 1  Copy design 1  Total score 30        Immunization History  Administered Date(s) Administered  . Pneumococcal Conjugate-13 08/28/2013  . Pneumococcal Polysaccharide-23 10/16/2008, 12/25/2014  . Tdap 11/28/2012  . Zoster 12/02/2012   Screening Tests Health Maintenance  Topic Date Due  . INFLUENZA VACCINE  01/06/2017  . MAMMOGRAM  03/31/2017  . COLONOSCOPY  03/12/2018  . TETANUS/TDAP  11/29/2022  . DEXA SCAN  Completed  . Hepatitis C Screening  Completed  . PNA vac Low Risk Adult  Completed      Plan:   ***   I have personally reviewed and noted the following in the patient's chart:   . Medical and social history . Use of alcohol, tobacco or illicit drugs  . Current medications and  supplements . Functional ability and status . Nutritional status . Physical activity . Advanced directives . List of other physicians . Hospitalizations, surgeries, and ER visits in previous 12 months . Vitals . Screenings to include cognitive, depression, and falls . Referrals and appointments  In addition, I have reviewed and discussed with patient certain preventive protocols, quality metrics, and best practice recommendations. A written personalized care plan for preventive services as well as  general preventive health recommendations were provided to patient.     Naaman Plummer [place], [place]  02/02/2017

## 2017-02-04 ENCOUNTER — Encounter: Payer: Self-pay | Admitting: Family Medicine

## 2017-02-04 ENCOUNTER — Ambulatory Visit (INDEPENDENT_AMBULATORY_CARE_PROVIDER_SITE_OTHER): Payer: Medicare HMO | Admitting: Family Medicine

## 2017-02-04 DIAGNOSIS — G4730 Sleep apnea, unspecified: Secondary | ICD-10-CM | POA: Diagnosis not present

## 2017-02-04 DIAGNOSIS — E739 Lactose intolerance, unspecified: Secondary | ICD-10-CM

## 2017-02-04 DIAGNOSIS — R002 Palpitations: Secondary | ICD-10-CM | POA: Diagnosis not present

## 2017-02-04 DIAGNOSIS — E785 Hyperlipidemia, unspecified: Secondary | ICD-10-CM | POA: Diagnosis not present

## 2017-02-04 DIAGNOSIS — I10 Essential (primary) hypertension: Secondary | ICD-10-CM

## 2017-02-04 DIAGNOSIS — E669 Obesity, unspecified: Secondary | ICD-10-CM | POA: Diagnosis not present

## 2017-02-04 DIAGNOSIS — Z0000 Encounter for general adult medical examination without abnormal findings: Secondary | ICD-10-CM | POA: Diagnosis not present

## 2017-02-04 HISTORY — DX: Palpitations: R00.2

## 2017-02-04 NOTE — Assessment & Plan Note (Signed)
She agrees to see cardiology if worsens.

## 2017-02-04 NOTE — Patient Instructions (Signed)
MIND diet  DASH Eating Plan DASH stands for "Dietary Approaches to Stop Hypertension." The DASH eating plan is a healthy eating plan that has been shown to reduce high blood pressure (hypertension). It may also reduce your risk for type 2 diabetes, heart disease, and stroke. The DASH eating plan may also help with weight loss. What are tips for following this plan? General guidelines  Avoid eating more than 2,300 mg (milligrams) of salt (sodium) a day. If you have hypertension, you may need to reduce your sodium intake to 1,500 mg a day.  Limit alcohol intake to no more than 1 drink a day for nonpregnant women and 2 drinks a day for men. One drink equals 12 oz of beer, 5 oz of wine, or 1 oz of hard liquor.  Work with your health care provider to maintain a healthy body weight or to lose weight. Ask what an ideal weight is for you.  Get at least 30 minutes of exercise that causes your heart to beat faster (aerobic exercise) most days of the week. Activities may include walking, swimming, or biking.  Work with your health care provider or diet and nutrition specialist (dietitian) to adjust your eating plan to your individual calorie needs. Reading food labels  Check food labels for the amount of sodium per serving. Choose foods with less than 5 percent of the Daily Value of sodium. Generally, foods with less than 300 mg of sodium per serving fit into this eating plan.  To find whole grains, look for the word "whole" as the first word in the ingredient list. Shopping  Buy products labeled as "low-sodium" or "no salt added."  Buy fresh foods. Avoid canned foods and premade or frozen meals. Cooking  Avoid adding salt when cooking. Use salt-free seasonings or herbs instead of table salt or sea salt. Check with your health care provider or pharmacist before using salt substitutes.  Do not fry foods. Cook foods using healthy methods such as baking, boiling, grilling, and broiling  instead.  Cook with heart-healthy oils, such as olive, canola, soybean, or sunflower oil. Meal planning   Eat a balanced diet that includes: ? 5 or more servings of fruits and vegetables each day. At each meal, try to fill half of your plate with fruits and vegetables. ? Up to 6-8 servings of whole grains each day. ? Less than 6 oz of lean meat, poultry, or fish each day. A 3-oz serving of meat is about the same size as a deck of cards. One egg equals 1 oz. ? 2 servings of low-fat dairy each day. ? A serving of nuts, seeds, or beans 5 times each week. ? Heart-healthy fats. Healthy fats called Omega-3 fatty acids are found in foods such as flaxseeds and coldwater fish, like sardines, salmon, and mackerel.  Limit how much you eat of the following: ? Canned or prepackaged foods. ? Food that is high in trans fat, such as fried foods. ? Food that is high in saturated fat, such as fatty meat. ? Sweets, desserts, sugary drinks, and other foods with added sugar. ? Full-fat dairy products.  Do not salt foods before eating.  Try to eat at least 2 vegetarian meals each week.  Eat more home-cooked food and less restaurant, buffet, and fast food.  When eating at a restaurant, ask that your food be prepared with less salt or no salt, if possible. What foods are recommended? The items listed may not be a complete list. Talk with your   dietitian about what dietary choices are best for you. Grains Whole-grain or whole-wheat bread. Whole-grain or whole-wheat pasta. Brown rice. Oatmeal. Quinoa. Bulgur. Whole-grain and low-sodium cereals. Pita bread. Low-fat, low-sodium crackers. Whole-wheat flour tortillas. Vegetables Fresh or frozen vegetables (raw, steamed, roasted, or grilled). Low-sodium or reduced-sodium tomato and vegetable juice. Low-sodium or reduced-sodium tomato sauce and tomato paste. Low-sodium or reduced-sodium canned vegetables. Fruits All fresh, dried, or frozen fruit. Canned fruit in  natural juice (without added sugar). Meat and other protein foods Skinless chicken or turkey. Ground chicken or turkey. Pork with fat trimmed off. Fish and seafood. Egg whites. Dried beans, peas, or lentils. Unsalted nuts, nut butters, and seeds. Unsalted canned beans. Lean cuts of beef with fat trimmed off. Low-sodium, lean deli meat. Dairy Low-fat (1%) or fat-free (skim) milk. Fat-free, low-fat, or reduced-fat cheeses. Nonfat, low-sodium ricotta or cottage cheese. Low-fat or nonfat yogurt. Low-fat, low-sodium cheese. Fats and oils Soft margarine without trans fats. Vegetable oil. Low-fat, reduced-fat, or light mayonnaise and salad dressings (reduced-sodium). Canola, safflower, olive, soybean, and sunflower oils. Avocado. Seasoning and other foods Herbs. Spices. Seasoning mixes without salt. Unsalted popcorn and pretzels. Fat-free sweets. What foods are not recommended? The items listed may not be a complete list. Talk with your dietitian about what dietary choices are best for you. Grains Baked goods made with fat, such as croissants, muffins, or some breads. Dry pasta or rice meal packs. Vegetables Creamed or fried vegetables. Vegetables in a cheese sauce. Regular canned vegetables (not low-sodium or reduced-sodium). Regular canned tomato sauce and paste (not low-sodium or reduced-sodium). Regular tomato and vegetable juice (not low-sodium or reduced-sodium). Pickles. Olives. Fruits Canned fruit in a light or heavy syrup. Fried fruit. Fruit in cream or butter sauce. Meat and other protein foods Fatty cuts of meat. Ribs. Fried meat. Bacon. Sausage. Bologna and other processed lunch meats. Salami. Fatback. Hotdogs. Bratwurst. Salted nuts and seeds. Canned beans with added salt. Canned or smoked fish. Whole eggs or egg yolks. Chicken or turkey with skin. Dairy Whole or 2% milk, cream, and half-and-half. Whole or full-fat cream cheese. Whole-fat or sweetened yogurt. Full-fat cheese. Nondairy  creamers. Whipped toppings. Processed cheese and cheese spreads. Fats and oils Butter. Stick margarine. Lard. Shortening. Ghee. Bacon fat. Tropical oils, such as coconut, palm kernel, or palm oil. Seasoning and other foods Salted popcorn and pretzels. Onion salt, garlic salt, seasoned salt, table salt, and sea salt. Worcestershire sauce. Tartar sauce. Barbecue sauce. Teriyaki sauce. Soy sauce, including reduced-sodium. Steak sauce. Canned and packaged gravies. Fish sauce. Oyster sauce. Cocktail sauce. Horseradish that you find on the shelf. Ketchup. Mustard. Meat flavorings and tenderizers. Bouillon cubes. Hot sauce and Tabasco sauce. Premade or packaged marinades. Premade or packaged taco seasonings. Relishes. Regular salad dressings. Where to find more information:  National Heart, Lung, and Blood Institute: www.nhlbi.nih.gov  American Heart Association: www.heart.org Summary  The DASH eating plan is a healthy eating plan that has been shown to reduce high blood pressure (hypertension). It may also reduce your risk for type 2 diabetes, heart disease, and stroke.  With the DASH eating plan, you should limit salt (sodium) intake to 2,300 mg a day. If you have hypertension, you may need to reduce your sodium intake to 1,500 mg a day.  When on the DASH eating plan, aim to eat more fresh fruits and vegetables, whole grains, lean proteins, low-fat dairy, and heart-healthy fats.  Work with your health care provider or diet and nutrition specialist (dietitian) to adjust your eating plan   to your individual calorie needs. This information is not intended to replace advice given to you by your health care provider. Make sure you discuss any questions you have with your health care provider. Document Released: 05/14/2011 Document Revised: 05/18/2016 Document Reviewed: 05/18/2016 Elsevier Interactive Patient Education  2017 [employer]  

## 2017-02-04 NOTE — Assessment & Plan Note (Signed)
Recently diagnosed. She is supposed to use CPAP but has not proceeded. She was seen by orthodontics about oral appliance but insurance would not cover so she is considering CPAP again

## 2017-02-04 NOTE — Assessment & Plan Note (Signed)
hgba1c acceptable, minimize simple carbs. Increase exercise as tolerated.  

## 2017-02-04 NOTE — Assessment & Plan Note (Signed)
Well controlled, no changes to meds. Encouraged heart healthy diet such as the DASH diet and exercise as tolerated.  °

## 2017-02-04 NOTE — Assessment & Plan Note (Signed)
Tolerating statin, encouraged heart healthy diet, avoid trans fats, minimize simple carbs and saturated fats. Increase exercise as tolerated 

## 2017-02-05 LAB — CBC
HEMATOCRIT: 41.8 % (ref 36.0–46.0)
Hemoglobin: 13.7 g/dL (ref 12.0–15.0)
MCHC: 32.6 g/dL (ref 30.0–36.0)
MCV: 95.6 fl (ref 78.0–100.0)
PLATELETS: 224 10*3/uL (ref 150.0–400.0)
RBC: 4.37 Mil/uL (ref 3.87–5.11)
RDW: 13.9 % (ref 11.5–15.5)
WBC: 6.7 10*3/uL (ref 4.0–10.5)

## 2017-02-05 LAB — LIPID PANEL
CHOLESTEROL: 187 mg/dL (ref 0–200)
HDL: 48.7 mg/dL (ref >39.00)
LDL CALC: 101 mg/dL — AB (ref 0–99)
NonHDL: 138.48
Total CHOL/HDL Ratio: 4
Triglycerides: 185 mg/dL — ABNORMAL HIGH (ref 0.0–149.0)
VLDL: 37 mg/dL (ref 0.0–40.0)

## 2017-02-05 LAB — COMPREHENSIVE METABOLIC PANEL
ALBUMIN: 4.5 g/dL (ref 3.5–5.2)
ALK PHOS: 50 U/L (ref 39–117)
ALT: 14 U/L (ref 0–35)
AST: 17 U/L (ref 0–37)
BUN: 25 mg/dL — ABNORMAL HIGH (ref 6–23)
CALCIUM: 10.1 mg/dL (ref 8.4–10.5)
CHLORIDE: 102 meq/L (ref 96–112)
CO2: 30 mEq/L (ref 19–32)
CREATININE: 1.2 mg/dL (ref 0.40–1.20)
GFR: 47.39 mL/min — ABNORMAL LOW (ref >60.00)
Glucose, Bld: 96 mg/dL (ref 70–99)
POTASSIUM: 4.8 meq/L (ref 3.5–5.1)
Sodium: 140 mEq/L (ref 135–145)
TOTAL PROTEIN: 7.3 g/dL (ref 6.0–8.3)
Total Bilirubin: 0.3 mg/dL (ref 0.2–1.2)

## 2017-02-05 LAB — HEMOGLOBIN A1C: Hgb A1c MFr Bld: 5.8 % (ref 4.6–6.5)

## 2017-02-05 LAB — TSH: TSH: 2.67 u[IU]/mL (ref 0.35–4.50)

## 2017-02-08 ENCOUNTER — Encounter: Payer: Self-pay | Admitting: Family Medicine

## 2017-02-08 DIAGNOSIS — E669 Obesity, unspecified: Secondary | ICD-10-CM

## 2017-02-08 HISTORY — DX: Obesity, unspecified: E66.9

## 2017-02-08 NOTE — Assessment & Plan Note (Signed)
Patient encouraged to maintain heart healthy diet, regular exercise, adequate sleep. Consider daily probiotics. Take medications as prescribed 

## 2017-02-08 NOTE — Progress Notes (Signed)
Patient ID: [name], female   DOB: Apr 24, 1951, 66 y.o.   MRN: [id number]   Subjective:    Patient ID: [name], female    DOB: 09-13-1950, 66 y.o.   MRN: [id number]  Chief Complaint  Patient presents with  . Annual Exam    HPI Patient is in today for annual preventative exam and follow up on chronic medical concerns including hypertension, hyperlipidemia, obesity and hyperglycemia. No polyuria or polydipsia. No recent febrile illness or hospitalization. Denies CP/palp/SOB/HA/congestion/fevers/GI or GU c/o. Taking meds as prescribed  Past Medical History:  Diagnosis Date  . Arthritis   . Benign paroxysmal positional vertigo 11/14/2013  . Depression   . Essential hypertension, benign   . GERD (gastroesophageal reflux disease) 09/02/2013  . Glaucoma   . Glucose intolerance (impaired glucose tolerance)   . H/O tobacco use, presenting hazards to health 09/02/2013  . History  of basal cell carcinoma   . History of cardiac arrhythmia   . Hyperlipidemia   . Medicare welcome visit 11/28/2012  . Obesity 02/08/2017  . Palpitations 02/04/2017  . Personal history of colonic polyps 09/02/2013   Patient reports H/O 2 colonoscopies, last one dates back to 2012 in [place] She reports they were labeled as incomplete.  Reports 1st colonoscopy had 1 benign polyp and the 2nd was clear.   . Sleep apnea 10/01/2016    Past Surgical History:  Procedure Laterality Date  . CERVICAL CONE BIOPSY  1960s  . DILATION AND CURETTAGE OF UTERUS  1966  . KNEE SURGERY Left 11/01/2008   left knee total replacement Dr Sharol Given    Family History  Problem Relation Age of Onset  . Coronary artery disease Paternal Grandmother   . Hyperlipidemia Paternal Grandmother   . Hypertension Paternal Grandmother   . Stroke Paternal Grandmother   . Hypertension Mother   . Lung cancer Mother        smoker  . Uterine cancer Mother   . Birth defects Mother        lung cancer in smoker  . COPD Mother   . Hypertension Father   .  Kidney disease Father        Renal Cell Carcinoma  . Birth defects Father        renal cell CA, smoker  . Hypertension Maternal Grandmother   . Hypertension Maternal Grandfather   . Hypertension Paternal Grandfather   . Dementia Paternal Grandfather   . Cancer Brother        testicular  . Heart disease Brother        NI, smoker  . Hyperlipidemia Unknown   . Colon cancer Neg Hx     Social History   Social History  . Marital status: Divorced    Spouse name: N/A  . Number of children: N/A  . Years of education: N/A   Occupational History  . Not on file.   Social History Main Topics  . Smoking status: Former Smoker    Packs/day: 1.00    Years: 15.00    Types: Cigarettes    Quit date: 06/08/1982  . Smokeless tobacco: Never Used  . Alcohol use Yes     Comment: wine w/ dinner 'once in a while'  . Drug use: No  . Sexual activity: No     Comment: lives alone, no dietary restrictions except no lactose,   Other Topics Concern  . Not on file   Social History Narrative   Occupation: unemployed Product manager- now retired  Divorced   no children    Moved from [place] alone with 4 cats   Enjoys gardening, planting.              Outpatient Medications Prior to Visit  Medication Sig Dispense Refill  . aspirin 81 MG tablet Take 81 mg by mouth daily.      . fish oil-omega-3 fatty acids 1000 MG capsule Take 2 g by mouth daily.     . hydrochlorothiazide (MICROZIDE) 12.5 MG capsule TAKE ONE CAPSULE BY MOUTH ONCE DAILY 90 capsule 3  . ibuprofen (ADVIL,MOTRIN) 200 MG tablet Take 200 mg by mouth every 6 (six) hours as needed for pain.    Marland Kitchen lovastatin (MEVACOR) 20 MG tablet TAKE TWO TABLETS BY MOUTH AT BEDTIME 180 tablet 3  . metoprolol (LOPRESSOR) 50 MG tablet TAKE ONE TABLET BY MOUTH TWICE DAILY 180 tablet 3  . tretinoin (RETIN-A) 0.1 % cream APPLY A PEA-SIZED AMOUNT TOPICALLY TO FACE AT BEDTIME 45 g 5   No facility-administered medications prior to visit.      No Known Allergies  Review of Systems  Constitutional: Negative for chills, fever and malaise/fatigue.  HENT: Negative for congestion and hearing loss.   Eyes: Negative for discharge.  Respiratory: Negative for cough, sputum production and shortness of breath.   Cardiovascular: Negative for chest pain, palpitations and leg swelling.  Gastrointestinal: Negative for abdominal pain, blood in stool, constipation, diarrhea, heartburn, nausea and vomiting.  Genitourinary: Negative for dysuria, frequency, hematuria and urgency.  Musculoskeletal: Negative for back pain, falls and myalgias.  Skin: Negative for rash.  Neurological: Negative for dizziness, sensory change, loss of consciousness, weakness and headaches.  Endo/Heme/Allergies: Negative for environmental allergies. Does not bruise/bleed easily.  Psychiatric/Behavioral: Negative for depression and suicidal ideas. The patient is not nervous/anxious and does not have insomnia.        Objective:    Physical Exam  Constitutional: She is oriented to person, place, and time. She appears well-developed and well-nourished. No distress.  HENT:  Head: Normocephalic and atraumatic.  Eyes: Conjunctivae are normal.  Neck: Neck supple. No thyromegaly present.  Cardiovascular: Normal rate, regular rhythm and normal heart sounds.   No murmur heard. Pulmonary/Chest: Effort normal and breath sounds normal. No respiratory distress.  Abdominal: Soft. Bowel sounds are normal. She exhibits no distension and no mass. There is no tenderness.  Musculoskeletal: She exhibits no edema.  Lymphadenopathy:    She has no cervical adenopathy.  Neurological: She is alert and oriented to person, place, and time.  Skin: Skin is warm and dry.  Psychiatric: She has a normal mood and affect. Her behavior is normal.    BP 110/70 (BP Location: Left Arm, Patient Position: Sitting, Cuff Size: Normal)   Pulse 71   Temp 97.8 F (36.6 C) (Oral)   Ht 4' 11.5"  (1.511 m)   Wt 197 lb (89.4 kg)   SpO2 97%   BMI 39.12 kg/m  Wt Readings from Last 3 Encounters:  02/04/17 197 lb (89.4 kg)  01/08/17 195 lb 3.2 oz (88.5 kg)  10/01/16 195 lb (88.5 kg)     Lab Results  Component Value Date   WBC 6.7 02/04/2017   HGB 13.7 02/04/2017   HCT 41.8 02/04/2017   PLT 224.0 02/04/2017   GLUCOSE 96 02/04/2017   CHOL 187 02/04/2017   TRIG 185.0 (H) 02/04/2017   HDL 48.70 02/04/2017   LDLDIRECT 131.8 10/10/2008   LDLCALC 101 (H) 02/04/2017   ALT 14  02/04/2017   AST 17 02/04/2017   NA 140 02/04/2017   K 4.8 02/04/2017   CL 102 02/04/2017   CREATININE 1.20 02/04/2017   BUN 25 (H) 02/04/2017   CO2 30 02/04/2017   TSH 2.67 02/04/2017   INR 2.1 (H) 11/05/2008   HGBA1C 5.8 02/04/2017    Lab Results  Component Value Date   TSH 2.67 02/04/2017   Lab Results  Component Value Date   WBC 6.7 02/04/2017   HGB 13.7 02/04/2017   HCT 41.8 02/04/2017   MCV 95.6 02/04/2017   PLT 224.0 02/04/2017   Lab Results  Component Value Date   NA 140 02/04/2017   K 4.8 02/04/2017   CO2 30 02/04/2017   GLUCOSE 96 02/04/2017   BUN 25 (H) 02/04/2017   CREATININE 1.20 02/04/2017   BILITOT 0.3 02/04/2017   ALKPHOS 50 02/04/2017   AST 17 02/04/2017   ALT 14 02/04/2017   PROT 7.3 02/04/2017   ALBUMIN 4.5 02/04/2017   CALCIUM 10.1 02/04/2017   GFR 47.39 (L) 02/04/2017   Lab Results  Component Value Date   CHOL 187 02/04/2017   Lab Results  Component Value Date   HDL 48.70 02/04/2017   Lab Results  Component Value Date   LDLCALC 101 (H) 02/04/2017   Lab Results  Component Value Date   TRIG 185.0 (H) 02/04/2017   Lab Results  Component Value Date   CHOLHDL 4 02/04/2017   Lab Results  Component Value Date   HGBA1C 5.8 02/04/2017       Assessment & Plan:   Problem List Items Addressed This Visit    GLUCOSE INTOLERANCE    hgba1c acceptable, minimize simple carbs. Increase exercise as tolerated      Relevant Orders   Hemoglobin A1c  (Completed)   Hyperlipidemia    Tolerating statin, encouraged heart healthy diet, avoid trans fats, minimize simple carbs and saturated fats. Increase exercise as tolerated      ESSENTIAL HYPERTENSION, BENIGN    Well controlled, no changes to meds. Encouraged heart healthy diet such as the DASH diet and exercise as tolerated.       Relevant Orders   CBC (Completed)   Comprehensive metabolic panel (Completed)   TSH (Completed)   Preventative health care    Patient encouraged to maintain heart healthy diet, regular exercise, adequate sleep. Consider daily probiotics. Take medications as prescribed.      Sleep apnea    Recently diagnosed. She is supposed to use CPAP but has not proceeded. She was seen by orthodontics about oral appliance but insurance would not cover so she is considering CPAP again      Palpitations    She agrees to see cardiology if worsens.       Obesity    Encouraged DASH diet, decrease po intake and increase exercise as tolerated. Needs 7-8 hours of sleep nightly. Avoid trans fats, eat small, frequent meals every 4-5 hours with lean proteins, complex carbs and healthy fats. Minimize simple carbs, she is starting the KetoDiet this week but will consider bariatric referral if weight loss is slow         I am having Ms. Awe maintain her aspirin, fish oil-omega-3 fatty acids, ibuprofen, lovastatin, hydrochlorothiazide, metoprolol tartrate, and tretinoin.  No orders of the defined types were placed in this encounter.     Penni Homans, MD

## 2017-02-08 NOTE — Assessment & Plan Note (Signed)
Encouraged DASH diet, decrease po intake and increase exercise as tolerated. Needs 7-8 hours of sleep nightly. Avoid trans fats, eat small, frequent meals every 4-5 hours with lean proteins, complex carbs and healthy fats. Minimize simple carbs, she is starting the KetoDiet this week but will consider bariatric referral if weight loss is slow

## 2017-03-22 ENCOUNTER — Other Ambulatory Visit: Payer: Self-pay | Admitting: Family Medicine

## 2017-03-22 DIAGNOSIS — E785 Hyperlipidemia, unspecified: Secondary | ICD-10-CM

## 2017-03-22 DIAGNOSIS — E739 Lactose intolerance, unspecified: Secondary | ICD-10-CM

## 2017-03-22 DIAGNOSIS — I10 Essential (primary) hypertension: Secondary | ICD-10-CM

## 2017-04-15 ENCOUNTER — Other Ambulatory Visit: Payer: Self-pay | Admitting: Family Medicine

## 2017-04-15 DIAGNOSIS — I10 Essential (primary) hypertension: Secondary | ICD-10-CM

## 2017-04-15 DIAGNOSIS — E785 Hyperlipidemia, unspecified: Secondary | ICD-10-CM

## 2017-04-15 DIAGNOSIS — E739 Lactose intolerance, unspecified: Secondary | ICD-10-CM

## 2017-05-14 ENCOUNTER — Ambulatory Visit: Payer: Medicare HMO | Admitting: Family Medicine

## 2017-05-14 ENCOUNTER — Encounter: Payer: Self-pay | Admitting: Family Medicine

## 2017-05-14 DIAGNOSIS — E669 Obesity, unspecified: Secondary | ICD-10-CM | POA: Diagnosis not present

## 2017-05-14 DIAGNOSIS — E739 Lactose intolerance, unspecified: Secondary | ICD-10-CM | POA: Diagnosis not present

## 2017-05-14 DIAGNOSIS — E785 Hyperlipidemia, unspecified: Secondary | ICD-10-CM | POA: Diagnosis not present

## 2017-05-14 DIAGNOSIS — I10 Essential (primary) hypertension: Secondary | ICD-10-CM

## 2017-05-14 LAB — CBC
HEMATOCRIT: 42.1 % (ref 36.0–46.0)
HEMOGLOBIN: 13.8 g/dL (ref 12.0–15.0)
MCHC: 32.8 g/dL (ref 30.0–36.0)
MCV: 93.8 fl (ref 78.0–100.0)
PLATELETS: 243 10*3/uL (ref 150.0–400.0)
RBC: 4.49 Mil/uL (ref 3.87–5.11)
RDW: 14.2 % (ref 11.5–15.5)
WBC: 5.5 10*3/uL (ref 4.0–10.5)

## 2017-05-14 LAB — COMPREHENSIVE METABOLIC PANEL
ALT: 14 U/L (ref 0–35)
AST: 18 U/L (ref 0–37)
Albumin: 4.3 g/dL (ref 3.5–5.2)
Alkaline Phosphatase: 37 U/L — ABNORMAL LOW (ref 39–117)
BILIRUBIN TOTAL: 0.5 mg/dL (ref 0.2–1.2)
BUN: 21 mg/dL (ref 6–23)
CALCIUM: 9.3 mg/dL (ref 8.4–10.5)
CHLORIDE: 102 meq/L (ref 96–112)
CO2: 29 meq/L (ref 19–32)
Creatinine, Ser: 1.01 mg/dL (ref 0.40–1.20)
GFR: 57.78 mL/min — AB (ref >60.00)
Glucose, Bld: 97 mg/dL (ref 70–99)
POTASSIUM: 4.4 meq/L (ref 3.5–5.1)
Sodium: 139 mEq/L (ref 135–145)
Total Protein: 6.8 g/dL (ref 6.0–8.3)

## 2017-05-14 LAB — LIPID PANEL
CHOLESTEROL: 142 mg/dL (ref 0–200)
HDL: 47.2 mg/dL (ref >39.00)
LDL Cholesterol: 78 mg/dL (ref 0–99)
NonHDL: 95.22
TRIGLYCERIDES: 84 mg/dL (ref 0.0–149.0)
Total CHOL/HDL Ratio: 3
VLDL: 16.8 mg/dL (ref 0.0–40.0)

## 2017-05-14 LAB — HEMOGLOBIN A1C: HEMOGLOBIN A1C: 5.4 % (ref 4.6–6.5)

## 2017-05-14 LAB — TSH: TSH: 2.63 u[IU]/mL (ref 0.35–4.50)

## 2017-05-14 MED ORDER — LOVASTATIN 20 MG PO TABS: 40.0000 mg | ORAL_TABLET | Freq: Every day | ORAL | 3 refills | Status: DC

## 2017-05-14 MED ORDER — METOPROLOL TARTRATE 50 MG PO TABS
50.0000 mg | ORAL_TABLET | Freq: Two times a day (BID) | ORAL | 2 refills | Status: DC
Start: 2017-05-14 — End: 2017-05-14

## 2017-05-14 MED ORDER — METOPROLOL TARTRATE 50 MG PO TABS: 50.0000 mg | ORAL_TABLET | Freq: Two times a day (BID) | ORAL | 0 refills | Status: DC

## 2017-05-14 MED ORDER — HYDROCHLOROTHIAZIDE 12.5 MG PO CAPS: 12.5000 mg | ORAL_CAPSULE | Freq: Every day | ORAL | 3 refills | Status: DC

## 2017-05-14 MED ORDER — METOPROLOL TARTRATE 50 MG PO TABS
50.0000 mg | ORAL_TABLET | Freq: Two times a day (BID) | ORAL | 0 refills | Status: DC
Start: 2017-05-14 — End: 2017-05-14

## 2017-05-14 MED ORDER — LOVASTATIN 20 MG PO TABS: 40.0000 mg | ORAL_TABLET | Freq: Every day | ORAL | 0 refills | Status: DC

## 2017-05-14 MED ORDER — LOVASTATIN 20 MG PO TABS
40.0000 mg | ORAL_TABLET | Freq: Every day | ORAL | 0 refills | Status: DC
Start: 2017-05-14 — End: 2017-05-14

## 2017-05-14 MED ORDER — METOPROLOL TARTRATE 50 MG PO TABS: 50.0000 mg | ORAL_TABLET | Freq: Two times a day (BID) | ORAL | 3 refills | Status: DC

## 2017-05-14 NOTE — Progress Notes (Signed)
Subjective:  I acted as a Education administrator for Dr. Charlett Blake. [place], [place]  Patient ID: [name], female    DOB: 10-17-1950, 66 y.o.   MRN: [id number]  No chief complaint on file.   HPI  Patient is in today for a 3 month follow up and is feeling very well with the weight loss following the Keto diet.  She reports her leg pain, hip pain and heartburn of all resolved since starting the ketogenic diet and losing weight.  She is sleeping better, myalgias are better and overall she is very pleased with her progress.  No recent febrile illness or hospitalizations. Denies CP/palp/SOB/HA/congestion/fevers/GI or GU c/o. Taking meds as prescribed  Patient Care Team: Mosie Lukes, MD as PCP - General (Family Medicine) Rolm Bookbinder, MD as Consulting Physician (Dermatology) Emily Filbert, MD as Consulting Physician (Obstetrics and Gynecology)   Past Medical History:  Diagnosis Date  . Arthritis   . Benign paroxysmal positional vertigo 11/14/2013  . Depression   . Essential hypertension, benign   . GERD (gastroesophageal reflux disease) 09/02/2013  . Glaucoma   . Glucose intolerance (impaired glucose tolerance)   . H/O tobacco use, presenting hazards to health 09/02/2013  . History  of basal cell carcinoma   . History of cardiac arrhythmia   . Hyperlipidemia   . Medicare welcome visit 11/28/2012  . Obesity 02/08/2017  . Palpitations 02/04/2017  . Personal history of colonic polyps 09/02/2013   Patient reports H/O 2 colonoscopies, last one dates back to 2012 in [place] She reports they were labeled as incomplete.  Reports 1st colonoscopy had 1 benign polyp and the 2nd was clear.   . Sleep apnea 10/01/2016    Past Surgical History:  Procedure Laterality Date  . CERVICAL CONE BIOPSY  1960s  . DILATION AND CURETTAGE OF UTERUS  1966  . KNEE SURGERY Left 11/01/2008   left knee total replacement Dr Sharol Given    Family History  Problem Relation Age of Onset  . Coronary artery disease Paternal Grandmother   .  Hyperlipidemia Paternal Grandmother   . Hypertension Paternal Grandmother   . Stroke Paternal Grandmother   . Hypertension Mother   . Lung cancer Mother        smoker  . Uterine cancer Mother   . Birth defects Mother        lung cancer in smoker  . COPD Mother   . Hypertension Father   . Kidney disease Father        Renal Cell Carcinoma  . Birth defects Father        renal cell CA, smoker  . Hypertension Maternal Grandmother   . Hypertension Maternal Grandfather   . Hypertension Paternal Grandfather   . Dementia Paternal Grandfather   . Cancer Brother        testicular  . Heart disease Brother        NI, smoker  . Hyperlipidemia Unknown   . Colon cancer Neg Hx     Social History   Socioeconomic History  . Marital status: Divorced    Spouse name: Not on file  . Number of children: Not on file  . Years of education: Not on file  . Highest education level: Not on file  Social Needs  . Financial resource strain: Not on file  . Food insecurity - worry: Not on file  . Food insecurity - inability: Not on file  . Transportation needs - medical: Not on file  . Transportation needs -  non-medical: Not on file  Occupational History  . Not on file  Tobacco Use  . Smoking status: Former Smoker    Packs/day: 1.00    Years: 15.00    Pack years: 15.00    Types: Cigarettes    Last attempt to quit: 06/08/1982    Years since quitting: 34.9  . Smokeless tobacco: Never Used  Substance and Sexual Activity  . Alcohol use: Yes    Comment: wine w/ dinner 'once in a while'  . Drug use: No  . Sexual activity: No    Comment: lives alone, no dietary restrictions except no lactose,  Other Topics Concern  . Not on file  Social History Narrative   Occupation: unemployed Product manager- now retired   Divorced   no children    Moved from [place] alone with 4 cats   Enjoys gardening, planting.              Outpatient Medications Prior to Visit  Medication Sig  Dispense Refill  . aspirin 81 MG tablet Take 81 mg by mouth daily.      . fish oil-omega-3 fatty acids 1000 MG capsule Take 2 g by mouth daily.     Marland Kitchen ibuprofen (ADVIL,MOTRIN) 200 MG tablet Take 200 mg by mouth every 6 (six) hours as needed for pain.    Marland Kitchen tretinoin (RETIN-A) 0.1 % cream APPLY A PEA-SIZED AMOUNT TOPICALLY TO FACE AT BEDTIME 45 g 5  . hydrochlorothiazide (MICROZIDE) 12.5 MG capsule TAKE ONE CAPSULE BY MOUTH ONCE DAILY 90 capsule 3  . lovastatin (MEVACOR) 20 MG tablet TAKE TWO TABLETS BY MOUTH AT BEDTIME 180 tablet 0  . metoprolol tartrate (LOPRESSOR) 50 MG tablet TAKE ONE TABLET BY MOUTH TWICE DAILY 180 tablet 0   No facility-administered medications prior to visit.     No Known Allergies  Review of Systems  Constitutional: Negative for fever and malaise/fatigue.  HENT: Negative for congestion.   Eyes: Negative for blurred vision.  Respiratory: Negative for shortness of breath.   Cardiovascular: Negative for chest pain, palpitations and leg swelling.  Gastrointestinal: Negative for abdominal pain, blood in stool and nausea.  Genitourinary: Negative for dysuria and frequency.  Musculoskeletal: Negative for falls.  Skin: Negative for rash.  Neurological: Negative for dizziness, loss of consciousness and headaches.  Endo/Heme/Allergies: Negative for environmental allergies.  Psychiatric/Behavioral: Negative for depression. The patient is not nervous/anxious.        Objective:    Physical Exam  Constitutional: She is oriented to person, place, and time. She appears well-developed and well-nourished. No distress.  HENT:  Head: Normocephalic and atraumatic.  Nose: Nose normal.  Eyes: Right eye exhibits no discharge. Left eye exhibits no discharge.  Neck: Normal range of motion. Neck supple.  Cardiovascular: Normal rate and regular rhythm.  No murmur heard. Pulmonary/Chest: Effort normal and breath sounds normal.  Abdominal: Soft. Bowel sounds are normal. There is no  tenderness.  Musculoskeletal: She exhibits no edema.  Neurological: She is alert and oriented to person, place, and time.  Skin: Skin is warm and dry.  Psychiatric: She has a normal mood and affect.  Nursing note and vitals reviewed.   BP 118/82 (BP Location: Left Arm, Patient Position: Sitting, Cuff Size: Normal)   Pulse 80   Temp 98.3 F (36.8 C) (Oral)   Resp 18   Wt 182 lb 3.2 oz (82.6 kg)   SpO2 94%   BMI 36.18 kg/m  Wt Readings from Last 3 Encounters:  05/14/17 182 lb 3.2 oz (82.6 kg)  02/04/17 197 lb (89.4 kg)  01/08/17 195 lb 3.2 oz (88.5 kg)   BP Readings from Last 3 Encounters:  05/14/17 118/82  02/04/17 110/70  01/08/17 112/82     Immunization History  Administered Date(s) Administered  . Pneumococcal Conjugate-13 08/28/2013  . Pneumococcal Polysaccharide-23 10/16/2008, 12/25/2014  . Tdap 11/28/2012  . Zoster 12/02/2012    Health Maintenance  Topic Date Due  . INFLUENZA VACCINE  01/06/2017  . MAMMOGRAM  03/31/2017  . COLONOSCOPY  03/12/2018  . TETANUS/TDAP  11/29/2022  . DEXA SCAN  Completed  . Hepatitis C Screening  Completed  . PNA vac Low Risk Adult  Completed    Lab Results  Component Value Date   WBC 5.5 05/14/2017   HGB 13.8 05/14/2017   HCT 42.1 05/14/2017   PLT 243.0 05/14/2017   GLUCOSE 97 05/14/2017   CHOL 142 05/14/2017   TRIG 84.0 05/14/2017   HDL 47.20 05/14/2017   LDLDIRECT 131.8 10/10/2008   LDLCALC 78 05/14/2017   ALT 14 05/14/2017   AST 18 05/14/2017   NA 139 05/14/2017   K 4.4 05/14/2017   CL 102 05/14/2017   CREATININE 1.01 05/14/2017   BUN 21 05/14/2017   CO2 29 05/14/2017   TSH 2.63 05/14/2017   INR 2.1 (H) 11/05/2008   HGBA1C 5.4 05/14/2017    Lab Results  Component Value Date   TSH 2.63 05/14/2017   Lab Results  Component Value Date   WBC 5.5 05/14/2017   HGB 13.8 05/14/2017   HCT 42.1 05/14/2017   MCV 93.8 05/14/2017   PLT 243.0 05/14/2017   Lab Results  Component Value Date   NA 139 05/14/2017    K 4.4 05/14/2017   CO2 29 05/14/2017   GLUCOSE 97 05/14/2017   BUN 21 05/14/2017   CREATININE 1.01 05/14/2017   BILITOT 0.5 05/14/2017   ALKPHOS 37 (L) 05/14/2017   AST 18 05/14/2017   ALT 14 05/14/2017   PROT 6.8 05/14/2017   ALBUMIN 4.3 05/14/2017   CALCIUM 9.3 05/14/2017   GFR 57.78 (L) 05/14/2017   Lab Results  Component Value Date   CHOL 142 05/14/2017   Lab Results  Component Value Date   HDL 47.20 05/14/2017   Lab Results  Component Value Date   LDLCALC 78 05/14/2017   Lab Results  Component Value Date   TRIG 84.0 05/14/2017   Lab Results  Component Value Date   CHOLHDL 3 05/14/2017   Lab Results  Component Value Date   HGBA1C 5.4 05/14/2017         Assessment & Plan:   Problem List Items Addressed This Visit    GLUCOSE INTOLERANCE    hgba1c acceptable, minimize simple carbs. Increase exercise as tolerated. Continue current meds      Relevant Orders   Hemoglobin A1c (Completed)   Hyperlipidemia    Encouraged heart healthy diet, increase exercise, avoid trans fats, consider a krill oil cap daily      Relevant Medications   lovastatin (MEVACOR) 20 MG tablet   hydrochlorothiazide (MICROZIDE) 12.5 MG capsule   metoprolol tartrate (LOPRESSOR) 50 MG tablet   ESSENTIAL HYPERTENSION, BENIGN    Well controlled, no changes to meds. Encouraged heart healthy diet such as the DASH diet and exercise as tolerated.       Relevant Medications   lovastatin (MEVACOR) 20 MG tablet   hydrochlorothiazide (MICROZIDE) 12.5 MG capsule   metoprolol tartrate (LOPRESSOR) 50 MG tablet  Other Relevant Orders   CBC (Completed)   Comprehensive metabolic panel (Completed)   TSH (Completed)   Obesity    Has had great weight loss with dietary and exercise changes she is following the keto diet. Labs reviewed no concerns identified. Maintain adequate hydration       Other Visit Diagnoses    Hyperlipidemia, mild       Relevant Medications   lovastatin (MEVACOR) 20 MG  tablet   hydrochlorothiazide (MICROZIDE) 12.5 MG capsule   metoprolol tartrate (LOPRESSOR) 50 MG tablet   Other Relevant Orders   Lipid panel (Completed)      I am having [name] maintain her aspirin, fish oil-omega-3 fatty acids, ibuprofen, tretinoin, lovastatin, hydrochlorothiazide, and metoprolol tartrate.  Meds ordered this encounter  Medications  . DISCONTD: metoprolol tartrate (LOPRESSOR) 50 MG tablet    Sig: Take 1 tablet (50 mg total) by mouth 2 (two) times daily.    Dispense:  180 tablet    Refill:  0  . DISCONTD: lovastatin (MEVACOR) 20 MG tablet    Sig: Take 2 tablets (40 mg total) by mouth at bedtime.    Dispense:  180 tablet    Refill:  0  . DISCONTD: hydrochlorothiazide (MICROZIDE) 12.5 MG capsule    Sig: Take 1 capsule (12.5 mg total) by mouth daily.    Dispense:  90 capsule    Refill:  3  . DISCONTD: metoprolol tartrate (LOPRESSOR) 50 MG tablet    Sig: Take 1 tablet (50 mg total) by mouth 2 (two) times daily.    Dispense:  180 tablet    Refill:  0  . DISCONTD: lovastatin (MEVACOR) 20 MG tablet    Sig: Take 2 tablets (40 mg total) by mouth at bedtime.    Dispense:  180 tablet    Refill:  0  . DISCONTD: hydrochlorothiazide (MICROZIDE) 12.5 MG capsule    Sig: Take 1 capsule (12.5 mg total) by mouth daily.    Dispense:  90 capsule    Refill:  3  . DISCONTD: hydrochlorothiazide (MICROZIDE) 12.5 MG capsule    Sig: Take 1 capsule (12.5 mg total) by mouth daily.    Dispense:  90 capsule    Refill:  3  . DISCONTD: lovastatin (MEVACOR) 20 MG tablet    Sig: Take 2 tablets (40 mg total) by mouth at bedtime.    Dispense:  180 tablet    Refill:  0  . DISCONTD: metoprolol tartrate (LOPRESSOR) 50 MG tablet    Sig: Take 1 tablet (50 mg total) by mouth 2 (two) times daily.    Dispense:  180 tablet    Refill:  2  . lovastatin (MEVACOR) 20 MG tablet    Sig: Take 2 tablets (40 mg total) by mouth at bedtime.    Dispense:  180 tablet    Refill:  3  .  hydrochlorothiazide (MICROZIDE) 12.5 MG capsule    Sig: Take 1 capsule (12.5 mg total) by mouth daily.    Dispense:  90 capsule    Refill:  3  . metoprolol tartrate (LOPRESSOR) 50 MG tablet    Sig: Take 1 tablet (50 mg total) by mouth 2 (two) times daily.    Dispense:  180 tablet    Refill:  3    CMA served as scribe during this visit. History, Physical and Plan performed by medical provider. Documentation and orders reviewed and attested to.  Penni Homans, MD

## 2017-05-14 NOTE — Patient Instructions (Signed)

## 2017-05-19 NOTE — Assessment & Plan Note (Signed)
Well controlled, no changes to meds. Encouraged heart healthy diet such as the DASH diet and exercise as tolerated.  °

## 2017-05-19 NOTE — Assessment & Plan Note (Signed)
Has had great weight loss with dietary and exercise changes she is following the keto diet. Labs reviewed no concerns identified. Maintain adequate hydration

## 2017-05-19 NOTE — Assessment & Plan Note (Signed)
Encouraged heart healthy diet, increase exercise, avoid trans fats, consider a krill oil cap daily 

## 2017-05-19 NOTE — Assessment & Plan Note (Signed)
hgba1c acceptable, minimize simple carbs. Increase exercise as tolerated. Continue current meds 

## 2017-07-07 DIAGNOSIS — R69 Illness, unspecified: Secondary | ICD-10-CM | POA: Diagnosis not present

## 2017-07-29 DIAGNOSIS — R69 Illness, unspecified: Secondary | ICD-10-CM | POA: Diagnosis not present

## 2017-08-18 ENCOUNTER — Observation Stay (HOSPITAL_BASED_OUTPATIENT_CLINIC_OR_DEPARTMENT_OTHER)
Admission: EM | Admit: 2017-08-18 | Discharge: 2017-08-20 | Disposition: A | Discharge status: Home / Self Care | Payer: Medicare HMO | Attending: Nephrology | Admitting: Nephrology

## 2017-08-18 ENCOUNTER — Ambulatory Visit: Payer: Self-pay | Admitting: *Deleted

## 2017-08-18 ENCOUNTER — Emergency Department (HOSPITAL_BASED_OUTPATIENT_CLINIC_OR_DEPARTMENT_OTHER): Payer: Medicare HMO

## 2017-08-18 ENCOUNTER — Other Ambulatory Visit: Payer: Self-pay

## 2017-08-18 ENCOUNTER — Ambulatory Visit (INDEPENDENT_AMBULATORY_CARE_PROVIDER_SITE_OTHER): Payer: Medicare HMO | Admitting: Family Medicine

## 2017-08-18 ENCOUNTER — Encounter (HOSPITAL_BASED_OUTPATIENT_CLINIC_OR_DEPARTMENT_OTHER): Payer: Self-pay | Admitting: *Deleted

## 2017-08-18 VITALS — BP 126/82 | HR 70 | Temp 97.6°F | Ht 59.5 in | Wt 181.6 lb

## 2017-08-18 DIAGNOSIS — Z79899 Other long term (current) drug therapy: Secondary | ICD-10-CM | POA: Insufficient documentation

## 2017-08-18 DIAGNOSIS — G459 Transient cerebral ischemic attack, unspecified: Secondary | ICD-10-CM | POA: Diagnosis not present

## 2017-08-18 DIAGNOSIS — Z7982 Long term (current) use of aspirin: Secondary | ICD-10-CM | POA: Insufficient documentation

## 2017-08-18 DIAGNOSIS — R29818 Other symptoms and signs involving the nervous system: Secondary | ICD-10-CM | POA: Diagnosis not present

## 2017-08-18 DIAGNOSIS — Z87891 Personal history of nicotine dependence: Secondary | ICD-10-CM | POA: Insufficient documentation

## 2017-08-18 DIAGNOSIS — R200 Anesthesia of skin: Secondary | ICD-10-CM | POA: Diagnosis not present

## 2017-08-18 DIAGNOSIS — Z85828 Personal history of other malignant neoplasm of skin: Secondary | ICD-10-CM | POA: Diagnosis not present

## 2017-08-18 DIAGNOSIS — R4182 Altered mental status, unspecified: Secondary | ICD-10-CM | POA: Diagnosis not present

## 2017-08-18 DIAGNOSIS — R278 Other lack of coordination: Secondary | ICD-10-CM | POA: Diagnosis not present

## 2017-08-18 DIAGNOSIS — R002 Palpitations: Secondary | ICD-10-CM | POA: Insufficient documentation

## 2017-08-18 DIAGNOSIS — I10 Essential (primary) hypertension: Secondary | ICD-10-CM | POA: Diagnosis not present

## 2017-08-18 DIAGNOSIS — E785 Hyperlipidemia, unspecified: Secondary | ICD-10-CM | POA: Diagnosis not present

## 2017-08-18 DIAGNOSIS — S93402A Sprain of unspecified ligament of left ankle, initial encounter: Secondary | ICD-10-CM | POA: Diagnosis not present

## 2017-08-18 DIAGNOSIS — S0990XA Unspecified injury of head, initial encounter: Secondary | ICD-10-CM | POA: Diagnosis not present

## 2017-08-18 DIAGNOSIS — R51 Headache: Secondary | ICD-10-CM | POA: Diagnosis not present

## 2017-08-18 DIAGNOSIS — F8181 Disorder of written expression: Secondary | ICD-10-CM | POA: Diagnosis present

## 2017-08-18 LAB — URINALYSIS, ROUTINE W REFLEX MICROSCOPIC
Bilirubin Urine: NEGATIVE
GLUCOSE, UA: NEGATIVE mg/dL
HGB URINE DIPSTICK: NEGATIVE
KETONES UR: NEGATIVE mg/dL
Leukocytes, UA: NEGATIVE
Nitrite: NEGATIVE
PROTEIN: NEGATIVE mg/dL
Specific Gravity, Urine: 1.015 (ref 1.005–1.030)
pH: 6.5 (ref 5.0–8.0)

## 2017-08-18 LAB — PROTIME-INR
INR: 1.01
PROTHROMBIN TIME: 13.2 s (ref 11.4–15.2)

## 2017-08-18 LAB — RAPID URINE DRUG SCREEN, HOSP PERFORMED
Amphetamines: NOT DETECTED
BENZODIAZEPINES: NOT DETECTED
Barbiturates: NOT DETECTED
Cocaine: NOT DETECTED
Opiates: NOT DETECTED
TETRAHYDROCANNABINOL: NOT DETECTED

## 2017-08-18 LAB — DIFFERENTIAL
BASOS ABS: 0 10*3/uL (ref 0.0–0.1)
Basophils Relative: 1 %
EOS ABS: 0.1 10*3/uL (ref 0.0–0.7)
EOS PCT: 1 %
LYMPHS PCT: 40 %
Lymphs Abs: 2.6 10*3/uL (ref 0.7–4.0)
Monocytes Absolute: 0.5 10*3/uL (ref 0.1–1.0)
Monocytes Relative: 8 %
NEUTROS PCT: 50 %
Neutro Abs: 3.2 10*3/uL (ref 1.7–7.7)

## 2017-08-18 LAB — COMPREHENSIVE METABOLIC PANEL
ALBUMIN: 4.3 g/dL (ref 3.5–5.0)
ALK PHOS: 46 U/L (ref 38–126)
ALT: 23 U/L (ref 14–54)
ANION GAP: 10 (ref 5–15)
AST: 24 U/L (ref 15–41)
BUN: 30 mg/dL — ABNORMAL HIGH (ref 6–20)
CO2: 26 mmol/L (ref 22–32)
Calcium: 9.5 mg/dL (ref 8.9–10.3)
Chloride: 101 mmol/L (ref 101–111)
Creatinine, Ser: 1.06 mg/dL — ABNORMAL HIGH (ref 0.44–1.00)
GFR calc Af Amer: >60 mL/min (ref >60)
GFR calc non Af Amer: 52 mL/min — ABNORMAL LOW (ref >60)
GLUCOSE: 107 mg/dL — AB (ref 65–99)
POTASSIUM: 3.6 mmol/L (ref 3.5–5.1)
SODIUM: 137 mmol/L (ref 135–145)
Total Bilirubin: 0.6 mg/dL (ref 0.3–1.2)
Total Protein: 7.2 g/dL (ref 6.5–8.1)

## 2017-08-18 LAB — APTT: APTT: 39 s — AB (ref 24–36)

## 2017-08-18 LAB — TROPONIN I: Troponin I: <0.03 ng/mL (ref <0.03)

## 2017-08-18 LAB — CBC
HCT: 39.3 % (ref 36.0–46.0)
Hemoglobin: 13.2 g/dL (ref 12.0–15.0)
MCH: 31 pg (ref 26.0–34.0)
MCHC: 33.6 g/dL (ref 30.0–36.0)
MCV: 92.3 fL (ref 78.0–100.0)
PLATELETS: 213 10*3/uL (ref 150–400)
RBC: 4.26 MIL/uL (ref 3.87–5.11)
RDW: 13.6 % (ref 11.5–15.5)
WBC: 6.4 10*3/uL (ref 4.0–10.5)

## 2017-08-18 LAB — ETHANOL: Alcohol, Ethyl (B): <10 mg/dL (ref <10)

## 2017-08-18 MED ORDER — OMEGA-3 FATTY ACIDS 1000 MG PO CAPS: 2.0000 g | ORAL_CAPSULE | Freq: Every day | ORAL | Status: DC

## 2017-08-18 MED ORDER — ASPIRIN 300 MG RE SUPP: 300.0000 mg | Freq: Every day | RECTAL | Status: DC

## 2017-08-18 MED ORDER — SODIUM CHLORIDE 0.9 % IV SOLN
INTRAVENOUS | Status: AC
  Administered 2017-08-18: 1000 mL via INTRAVENOUS

## 2017-08-18 MED ORDER — PRAVASTATIN SODIUM 40 MG PO TABS
40.0000 mg | ORAL_TABLET | Freq: Every day | ORAL | Status: DC
  Administered 2017-08-19: 40 mg via ORAL
  Filled 2017-08-18: qty 1

## 2017-08-18 MED ORDER — ACETAMINOPHEN 325 MG PO TABS: 650.0000 mg | ORAL_TABLET | ORAL | Status: DC | PRN

## 2017-08-18 MED ORDER — ACETAMINOPHEN 160 MG/5ML PO SOLN: 650.0000 mg | ORAL | Status: DC | PRN

## 2017-08-18 MED ORDER — ACETAMINOPHEN 650 MG RE SUPP: 650.0000 mg | RECTAL | Status: DC | PRN

## 2017-08-18 MED ORDER — ENOXAPARIN SODIUM 40 MG/0.4ML ~~LOC~~ SOLN
40.0000 mg | SUBCUTANEOUS | Status: DC
  Administered 2017-08-18 – 2017-08-19 (×2): 40 mg via SUBCUTANEOUS
  Filled 2017-08-18 (×2): qty 0.4

## 2017-08-18 MED ORDER — SENNOSIDES-DOCUSATE SODIUM 8.6-50 MG PO TABS: 1.0000 | ORAL_TABLET | Freq: Every evening | ORAL | Status: DC | PRN

## 2017-08-18 MED ORDER — STROKE: EARLY STAGES OF RECOVERY BOOK
Freq: Once | Status: AC
  Administered 2017-08-18: 1

## 2017-08-18 MED ORDER — LORAZEPAM 2 MG/ML IJ SOLN
0.5000 mg | INTRAMUSCULAR | Status: DC | PRN
  Administered 2017-08-19: 1 mg via INTRAVENOUS
  Filled 2017-08-18: qty 1

## 2017-08-18 MED ORDER — ASPIRIN 325 MG PO TABS
325.0000 mg | ORAL_TABLET | Freq: Every day | ORAL | Status: DC
  Administered 2017-08-18 – 2017-08-19 (×2): 325 mg via ORAL
  Filled 2017-08-18 (×2): qty 1

## 2017-08-18 NOTE — Progress Notes (Addendum)
Patient arrived from [facility] about 2000, she was not able to use her right hand last night for less than an hour this symptom has resolved and she has no other issues necrologically at this time. She does say that she tripped and fell down while using her cell phone about 8 days ago and she sprained her left ankle and abraded her right knee. She is able to ambulate without assistance and has no pain, is alert and oriented. I contacted [facility] and Dr Myna Hidalgo has been assigned as her attending. Telemetry is on and she is resting comfortably, will continue to monitor. Patient has passed swallow screen but has no diet order.

## 2017-08-18 NOTE — Telephone Encounter (Signed)
Called c/o a dull headache and a change in her handwriting last night after she fell a week ago Tuesday.   She hit her head on the cement over her right eye and sprained her left ankle.  Did not seek medical help until now but the change in her handwriting and the contracture of her hand made her concerned. See triage notes below.  I have referred her to the ED.   She is going to [facility] ED now.  I have routed a note to Dr. Frederik Pear office to let her know of the situation.    Reason for Disposition . [1] ACUTE NEURO SYMPTOM AND [2] now fine  (DEFINITION: difficult to awaken OR confused thinking and talking OR slurred speech OR weakness of arms OR unsteady walking)  Answer Assessment - Initial Assessment Questions 1. MECHANISM: "How did the injury happen?" For falls, ask: "What height did you fall from?" and "What surface did you fall against?"      I stepped off edge of stepping stone and sprained my ankle and hit my head over my right eyebrow.   I have big bruise over my right eye. 2. ONSET: "When did the injury happen?" (Minutes or hours ago)      Last Tuesday at 11:00am   A week ago.   3. NEUROLOGIC SYMPTOMS: "Was there any loss of consciousness?" "Are there any other neurological symptoms?"      Had a dull headache since then.   Fatigue.  I just don't feel right. 4. MENTAL STATUS: "Does the person know who he is, who you are, and where he is?"      Last night I tried to write a label on a jar and could not write.  My hand contracted.   My penmanship was off for about an hour but then I was able to write again. 5. LOCATION: "What part of the head was hit?"      Over right eyebrow.  Bruise is much better now. 6. SCALP APPEARANCE: "What does the scalp look like? Is it bleeding now?" If so, ask: "Is it difficult to stop?"      It was scrapped from the cement and still tender 7. SIZE: For cuts, bruises, or swelling, ask: "How large is it?" (e.g., inches or centimeters)   Scrapped and bruised over right eye. 8. PAIN: "Is there any pain?" If so, ask: "How bad is it?"  (e.g., Scale 1-10; or mild, moderate, severe)     You have a dull headache.  You are taking Advil.   It helps.    I also sprained my left ankle.   It's bruised and hurts when I walk but I can walk on it. 9. TETANUS: For any breaks in the skin, ask: "When was the last tetanus booster?"     Up to date. 10. OTHER SYMPTOMS: "Do you have any other symptoms?" (e.g., neck pain, vomiting)       No blurred vision or dizziness 11. PREGNANCY: "Is there any chance you are pregnant?" "When was your last menstrual period?"       Not asked  Protocols used: HEAD INJURY-A-AH

## 2017-08-18 NOTE — H&P (Signed)
History and Physical    [name] DOB: 1951-05-20 DOA: 08/18/2017  PCP: Mosie Lukes, MD   Patient coming from: Home, by way of [facility]   Chief Complaint: Transient right hand numbness   HPI: [name] is a right-handed 67 y.o. female with medical history significant for hypertension, hyperlipidemia, and palpitations, now presenting to the emergency department after an episode of right hand numbness. Patient reports that she fell 1 week ago, struck her head, but did not lose consciousness.  She has had some mild headaches since that time that are relieved with ibuprofen at home.  She had otherwise remained well until last night at approximately 8 PM when she was attempting to write, but was unable to.  She reports complete numbness of the right hand, but denies any weakness.  Symptoms eventually resolved after approximately 45 minutes and have not returned.  She had never experienced similar symptoms previously.  Denies any change in vision or hearing or other focal numbness or weakness.  [facility] ED Course: Upon arrival to the ED, patient is found to be afebrile, saturating well on room air, and with vitals otherwise normal.  EKG features a normal sinus rhythm and noncontrast head CT is negative for acute intracranial abnormality.  Chemistry panel is notable for serum creatinine 1.06, similar to priors.  CBC is unremarkable, INR is normal, troponin is undetectable, UDS is negative, and urinalysis is unremarkable.  ED physician consulted with teleneurologist and admission to [facility] was recommended for MRI and further evaluation.  Patient remains hemodynamically stable, in no apparent respiratory distress, and will be observed on the telemetry unit for ongoing evaluation and management of transient right hand numbness concerning for possible TIA/CVA.  Review of Systems:  All other systems reviewed and apart from HPI, are negative.  Past Medical History:    Diagnosis Date  . Arthritis   . Benign paroxysmal positional vertigo 11/14/2013  . Depression   . Essential hypertension, benign   . GERD (gastroesophageal reflux disease) 09/02/2013  . Glaucoma   . Glucose intolerance (impaired glucose tolerance)   . H/O tobacco use, presenting hazards to health 09/02/2013  . History  of basal cell carcinoma   . History of cardiac arrhythmia   . Hyperlipidemia   . Medicare welcome visit 11/28/2012  . Obesity 02/08/2017  . Palpitations 02/04/2017  . Personal history of colonic polyps 09/02/2013   Patient reports H/O 2 colonoscopies, last one dates back to 2012 in [place] She reports they were labeled as incomplete.  Reports 1st colonoscopy had 1 benign polyp and the 2nd was clear.   . Sleep apnea 10/01/2016    Past Surgical History:  Procedure Laterality Date  . CERVICAL CONE BIOPSY  1960s  . DILATION AND CURETTAGE OF UTERUS  1966  . KNEE SURGERY Left 11/01/2008   left knee total replacement Dr Sharol Given     reports that she quit smoking about 35 years ago. Her smoking use included cigarettes. She has a 15.00 pack-year smoking history. she has never used smokeless tobacco. She reports that she drinks alcohol. She reports that she does not use drugs.  No Known Allergies  Family History  Problem Relation Age of Onset  . Coronary artery disease Paternal Grandmother   . Hyperlipidemia Paternal Grandmother   . Hypertension Paternal Grandmother   . Stroke Paternal Grandmother   . Hypertension Mother   . Lung cancer Mother        smoker  .  Uterine cancer Mother   . Birth defects Mother        lung cancer in smoker  . COPD Mother   . Hypertension Father   . Kidney disease Father        Renal Cell Carcinoma  . Birth defects Father        renal cell CA, smoker  . Hypertension Maternal Grandmother   . Hypertension Maternal Grandfather   . Hypertension Paternal Grandfather   . Dementia Paternal Grandfather   . Cancer Brother        testicular  . Heart  disease Brother        NI, smoker  . Hyperlipidemia Unknown   . Colon cancer Neg Hx      Prior to Admission medications   Medication Sig Start Date End Date Taking? Authorizing Provider  aspirin 81 MG tablet Take 81 mg by mouth daily.      [provider]  fish oil-omega-3 fatty acids 1000 MG capsule Take 2 g by mouth daily.     [provider]  hydrochlorothiazide (MICROZIDE) 12.5 MG capsule Take 1 capsule (12.5 mg total) by mouth daily. 05/14/17   Mosie Lukes, MD  ibuprofen (ADVIL,MOTRIN) 200 MG tablet Take 200 mg by mouth every 6 (six) hours as needed for pain.    [provider]  lovastatin (MEVACOR) 20 MG tablet Take 2 tablets (40 mg total) by mouth at bedtime. 05/14/17   Mosie Lukes, MD  metoprolol tartrate (LOPRESSOR) 50 MG tablet Take 1 tablet (50 mg total) by mouth 2 (two) times daily. 05/14/17   Mosie Lukes, MD  tretinoin (RETIN-A) 0.1 % cream APPLY A PEA-SIZED AMOUNT TOPICALLY TO FACE AT BEDTIME 11/18/16   Mosie Lukes, MD    Physical Exam: Vitals:   08/18/17 1600 08/18/17 1630 08/18/17 1731 08/18/17 1956  BP: (!) 159/59 (!) 174/71 (!) 173/76 (!) 187/69  Pulse:  69 67 75  Resp: 16 (!) 24 16 18   Temp:    97.9 F (36.6 C)  TempSrc:    Oral  SpO2: 96% 95% 100% 97%  Weight:      Height:    5' (1.524 m)      Constitutional: NAD, calm  Eyes: PERTLA, lids and conjunctivae normal ENMT: Mucous membranes are moist. Posterior pharynx clear of any exudate or lesions.   Neck: normal, supple, no masses, no thyromegaly Respiratory: clear to auscultation bilaterally, no wheezing, no crackles. Normal respiratory effort.   Cardiovascular: S1 & S2 heard, regular rate and rhythm. No significant JVD. Abdomen: No distension, no tenderness, no masses palpated. Bowel sounds normal.  Musculoskeletal: no clubbing / cyanosis. No joint deformity upper and lower extremities.   Skin: Superficial abrasion and faint ecchymosis about right eye. Warm, dry,  well-perfused. Neurologic: CN 2-12 grossly intact. Sensation intact. Strength 5/5 in all 4 limbs.  Psychiatric: Alert and oriented x 3. Pleasant, cooperative.     Labs on Admission: I have personally reviewed following labs and imaging studies  CBC: Recent Labs  Lab 08/18/17 1352  WBC 6.4  NEUTROABS 3.2  HGB 13.2  HCT 39.3  MCV 92.3  PLT 024   Basic Metabolic Panel: Recent Labs  Lab 08/18/17 1352  NA 137  K 3.6  CL 101  CO2 26  GLUCOSE 107*  BUN 30*  CREATININE 1.06*  CALCIUM 9.5   GFR: Estimated Creatinine Clearance: 47.5 mL/min (A) (by C-G formula based on SCr of 1.06 mg/dL (H)). Liver Function Tests: Recent Labs  Lab 08/18/17 1352  AST 24  ALT 23  ALKPHOS 46  BILITOT 0.6  PROT 7.2  ALBUMIN 4.3   No results for input(s): LIPASE, AMYLASE in the last 168 hours. No results for input(s): AMMONIA in the last 168 hours. Coagulation Profile: Recent Labs  Lab 08/18/17 1352  INR 1.01   Cardiac Enzymes: Recent Labs  Lab 08/18/17 1352  TROPONINI <0.03   BNP (last 3 results) No results for input(s): PROBNP in the last 8760 hours. HbA1C: No results for input(s): HGBA1C in the last 72 hours. CBG: No results for input(s): GLUCAP in the last 168 hours. Lipid Profile: No results for input(s): CHOL, HDL, LDLCALC, TRIG, CHOLHDL, LDLDIRECT in the last 72 hours. Thyroid Function Tests: No results for input(s): TSH, T4TOTAL, FREET4, T3FREE, THYROIDAB in the last 72 hours. Anemia Panel: No results for input(s): VITAMINB12, FOLATE, FERRITIN, TIBC, IRON, RETICCTPCT in the last 72 hours. Urine analysis:    Component Value Date/Time   COLORURINE YELLOW 08/18/2017 1200   APPEARANCEUR CLEAR 08/18/2017 1200   LABSPEC 1.015 08/18/2017 1200   PHURINE 6.5 08/18/2017 1200   GLUCOSEU NEGATIVE 08/18/2017 1200   HGBUR NEGATIVE 08/18/2017 1200   BILIRUBINUR NEGATIVE 08/18/2017 1200   KETONESUR NEGATIVE 08/18/2017 1200   PROTEINUR NEGATIVE 08/18/2017 1200   UROBILINOGEN  0.2 01/22/2014 1017   NITRITE NEGATIVE 08/18/2017 1200   LEUKOCYTESUR NEGATIVE 08/18/2017 1200   Sepsis Labs: @LABRCNTIP (procalcitonin:4,lacticidven:4) )No results found for this or any previous visit (from the past 240 hour(s)).   Radiological Exams on Admission: Ct Head Wo Contrast  Result Date: 08/18/2017 CLINICAL DATA:  Fall 1 week ago with right supraorbital injury EXAM: CT HEAD WITHOUT CONTRAST TECHNIQUE: Contiguous axial images were obtained from the base of the skull through the vertex without intravenous contrast. COMPARISON:  None. FINDINGS: Brain: Mild atrophic changes are noted. No findings to suggest acute hemorrhage, acute infarction or space-occupying mass lesion are noted. Vascular: No hyperdense vessel or unexpected calcification. Skull: Normal. Negative for fracture or focal lesion. Sinuses/Orbits: No acute finding. Other: None. IMPRESSION: Mild atrophic changes without acute abnormality. Electronically Signed   By: Inez Catalina M.D.   On: 08/18/2017 14:12    EKG: Independently reviewed. Normal sinus rhythm.   Assessment/Plan   1. Transient right hand numbness  - Presents following an episode of right hand numbness that lasted ~45 minutes and has resolved  - Head CT negative for acute intracranial abnormality and no focal deficit appreciated  - Discussed case with neurology, their care much appreciated  - Check MRI brain, MRA head & neck, echocardiogram, A1c, and fasting lipids  - Continue cardiac monitoring, frequent neuro checks, PT/OT/SLP evals  - Continue ASA and statin, increased to 325 mg and 40 mg     2. Hypertension  - BP elevated  - Hold Lopressor and HCTZ initially pending TIA/CVA workup    3. Hyperlipidemia  - Continue statin and Lovaza    4. Palpitations  - Patient reports long hx of occasional, fleeting palpitations, no recent change  - Admission EKG with normal sinus rhythm - Continue cardiac monitoring and check echo as above     DVT  prophylaxis: Lovenox Code Status: Full  Family Communication: Discussed with patient Consults called: Neurology Admission status: Observation    Vianne Bulls, MD [facility] Pager [phone]  If 7PM-7AM, please contact night-coverage [email or web address] Password [facility]  08/18/2017, 9:36 PM

## 2017-08-18 NOTE — ED Provider Notes (Addendum)
[place] EMERGENCY DEPARTMENT Provider Note   CSN: [id number] Arrival date & time: 08/18/17  1127     History   Chief Complaint Chief Complaint  Patient presents with  . Headache    HPI [name] is a 67 y.o. female.  HPI Patient presents with difficulty writing.  For around 45 minutes last night she had difficulty writing and using her right hand.  States that the hand was drawn together a little bit.  The fingers and thumb were together.  No other numbness or weakness.  She was writing at the time and was had difficulty doing the writing.  She presents with examples of her writing during that time.  Around 1 week ago she fell and struck her head and has had headaches since.  No confusion.  No other numbness or weakness.  No confusion.  No difficulty speaking.  Patient was alone during the time.  Saw her primary care doctor who sent her into the hospital today for further evaluation and treatment. Past Medical History:  Diagnosis Date  . Arthritis   . Benign paroxysmal positional vertigo 11/14/2013  . Depression   . Essential hypertension, benign   . GERD (gastroesophageal reflux disease) 09/02/2013  . Glaucoma   . Glucose intolerance (impaired glucose tolerance)   . H/O tobacco use, presenting hazards to health 09/02/2013  . History  of basal cell carcinoma   . History of cardiac arrhythmia   . Hyperlipidemia   . Medicare welcome visit 11/28/2012  . Obesity 02/08/2017  . Palpitations 02/04/2017  . Personal history of colonic polyps 09/02/2013   Patient reports H/O 2 colonoscopies, last one dates back to 2012 in [place] She reports they were labeled as incomplete.  Reports 1st colonoscopy had 1 benign polyp and the 2nd was clear.   . Sleep apnea 10/01/2016    Patient Active Problem List   Diagnosis Date Noted  . Hand weakness 08/18/2017  . Obesity 02/08/2017  . Palpitations 02/04/2017  . Sleep apnea 10/01/2016  . Diverticulitis of colon (without mention of  hemorrhage)(562.11) 01/26/2014  . Benign paroxysmal positional vertigo 11/14/2013  . Urinary frequency 09/02/2013  . Post-menopause 09/02/2013  . H/O tobacco use, presenting hazards to health 09/02/2013  . GERD (gastroesophageal reflux disease) 09/02/2013  . Personal history of colonic polyps 09/02/2013  . Preventative health care 11/28/2012  . RHINITIS 06/27/2010  . GLUCOSE INTOLERANCE 12/22/2007  . Hyperlipidemia 12/20/2007  . ESSENTIAL HYPERTENSION, BENIGN 12/20/2007  . ARTHRITIS 12/20/2007  . KNEE SPRAIN 12/20/2007  . CARCINOMA, BASAL CELL, HX OF 12/20/2007  . ARRHYTHMIA, HX OF 12/20/2007  . GLAUCOMA, HX OF 12/20/2007    Past Surgical History:  Procedure Laterality Date  . CERVICAL CONE BIOPSY  1960s  . DILATION AND CURETTAGE OF UTERUS  1966  . KNEE SURGERY Left 11/01/2008   left knee total replacement Dr Sharol Given    OB History    No data available       Home Medications    Prior to Admission medications   Medication Sig Start Date End Date Taking? Authorizing Provider  aspirin 81 MG tablet Take 81 mg by mouth daily.      [provider]  fish oil-omega-3 fatty acids 1000 MG capsule Take 2 g by mouth daily.     [provider]  hydrochlorothiazide (MICROZIDE) 12.5 MG capsule Take 1 capsule (12.5 mg total) by mouth daily. 05/14/17   Mosie Lukes, MD  ibuprofen (ADVIL,MOTRIN) 200 MG tablet Take 200 mg  by mouth every 6 (six) hours as needed for pain.    [provider]  lovastatin (MEVACOR) 20 MG tablet Take 2 tablets (40 mg total) by mouth at bedtime. 05/14/17   Mosie Lukes, MD  metoprolol tartrate (LOPRESSOR) 50 MG tablet Take 1 tablet (50 mg total) by mouth 2 (two) times daily. 05/14/17   Mosie Lukes, MD  tretinoin (RETIN-A) 0.1 % cream APPLY A PEA-SIZED AMOUNT TOPICALLY TO FACE AT BEDTIME 11/18/16   Mosie Lukes, MD    Family History Family History  Problem Relation Age of Onset  . Coronary artery disease Paternal Grandmother   .  Hyperlipidemia Paternal Grandmother   . Hypertension Paternal Grandmother   . Stroke Paternal Grandmother   . Hypertension Mother   . Lung cancer Mother        smoker  . Uterine cancer Mother   . Birth defects Mother        lung cancer in smoker  . COPD Mother   . Hypertension Father   . Kidney disease Father        Renal Cell Carcinoma  . Birth defects Father        renal cell CA, smoker  . Hypertension Maternal Grandmother   . Hypertension Maternal Grandfather   . Hypertension Paternal Grandfather   . Dementia Paternal Grandfather   . Cancer Brother        testicular  . Heart disease Brother        NI, smoker  . Hyperlipidemia Unknown   . Colon cancer Neg Hx     Social History Social History   Tobacco Use  . Smoking status: Former Smoker    Packs/day: 1.00    Years: 15.00    Pack years: 15.00    Types: Cigarettes    Last attempt to quit: 06/08/1982    Years since quitting: 35.2  . Smokeless tobacco: Never Used  Substance Use Topics  . Alcohol use: Yes    Comment: wine w/ dinner 'once in a while'  . Drug use: No     Allergies   Patient has no known allergies.   Review of Systems Review of Systems  Constitutional: Negative for appetite change and fatigue.  Respiratory: Negative for shortness of breath.   Gastrointestinal: Negative for abdominal pain.  Genitourinary: Negative for dysuria.  Musculoskeletal: Negative for back pain.  Neurological: Positive for headaches. Negative for speech difficulty.       Difficulty using her right hand and difficulty writing.  Hematological: Negative for adenopathy.  Psychiatric/Behavioral: Negative for confusion.     Physical Exam Updated Vital Signs BP (!) 159/59 (BP Location: Right Arm)   Pulse 69   Temp 98 F (36.7 C)   Resp 16   Ht 4\' 11"  (1.499 m)   Wt 82.1 kg (181 lb)   SpO2 96%   BMI 36.56 kg/m   Physical Exam  Constitutional: She appears well-developed.  HENT:  Periorbital ecchymosis on right side   Eyes: Pupils are equal, round, and reactive to light.  Neck: Neck supple. No neck rigidity.  Cardiovascular: Normal rate.  Pulmonary/Chest: Effort normal.  Neurological: She is alert.  Face symmetric.  Eye movements intact.  Good grip strength bilaterally.  Sensation over radial median and ulnar distributions of the hand intact.  Good strength in right hand with normal coordination.  Finger-nose intact bilaterally.  Skin: Skin is warm.  Psychiatric: She has a normal mood and affect.     ED Treatments /  Results  Labs (all labs ordered are listed, but only abnormal results are displayed) Labs Reviewed  APTT - Abnormal; Notable for the following components:      Result Value   aPTT 39 (*)    All other components within normal limits  COMPREHENSIVE METABOLIC PANEL - Abnormal; Notable for the following components:   Glucose, Bld 107 (*)    BUN 30 (*)    Creatinine, Ser 1.06 (*)    GFR calc non Af Amer 52 (*)    All other components within normal limits  ETHANOL  PROTIME-INR  CBC  DIFFERENTIAL  TROPONIN I  RAPID URINE DRUG SCREEN, HOSP PERFORMED  URINALYSIS, ROUTINE W REFLEX MICROSCOPIC    EKG  EKG Interpretation  Date/Time:  Wednesday August 18 2017 13:47:55 EDT Ventricular Rate:  62 PR Interval:    QRS Duration: 95 QT Interval:  394 QTC Calculation: 401 R Axis:   28 Text Interpretation:  Sinus rhythm Baseline wander in lead(s) V3 Confirmed by Davonna Belling [phone]) on 08/18/2017 3:00:24 PM       Radiology Ct Head Wo Contrast  Result Date: 08/18/2017 CLINICAL DATA:  Fall 1 week ago with right supraorbital injury EXAM: CT HEAD WITHOUT CONTRAST TECHNIQUE: Contiguous axial images were obtained from the base of the skull through the vertex without intravenous contrast. COMPARISON:  None. FINDINGS: Brain: Mild atrophic changes are noted. No findings to suggest acute hemorrhage, acute infarction or space-occupying mass lesion are noted. Vascular: No hyperdense vessel or  unexpected calcification. Skull: Normal. Negative for fracture or focal lesion. Sinuses/Orbits: No acute finding. Other: None. IMPRESSION: Mild atrophic changes without acute abnormality. Electronically Signed   By: Inez Catalina M.D.   On: 08/18/2017 14:12    Procedures Procedures (including critical care time)  Medications Ordered in ED Medications - No data to display   Initial Impression / Assessment and Plan / ED Course  I have reviewed the triage vital signs and the nursing notes.  Pertinent labs & imaging results that were available during my care of the patient were reviewed by me and considered in my medical decision making (see chart for details).     Patient had difficulty writing yesterday.  Resolved now.  Difficulty using her right hand.  Workup reassuring.  Discussed with hospitalist about admission to rule out TIA.  Dr. Lorin Mercy requests tele-neurology consult to see if patient requires admission.  Will consult tele-neurology.  Care turned over to Dr. Lita Mains.  Tele-neurology doctor recommended admission since we cannot get an MRI here.  Discussed with Dr. Lorin Mercy again and she accepted the patient in transfer.  Final Clinical Impressions(s) / ED Diagnoses   Final diagnoses:  Dysgraphia    ED Discharge Orders    None       Davonna Belling, MD 08/18/17 1616    Davonna Belling, MD 08/18/17 [phone]

## 2017-08-18 NOTE — Telephone Encounter (Signed)
Pt headed to ED per triage note. Forwarding to PCP for FYI.

## 2017-08-18 NOTE — Progress Notes (Signed)
[name], [birth year]. Patient is a 67yo who was unable to use her right hand well last night for about 45 minutes. He had been writing and could no longer write. She did fall and hit her head a week ago but is back to normal now. No speech or other CVA symptoms. She has had no further complaints but Dr. Alvino Chapel would like to have her observed for possible CVA. CT negative.  I asked Dr. Alvino Chapel to discuss this patient with teleneurology.   Teleneurologist recommends only MRI so will transfer for overnight observation.

## 2017-08-18 NOTE — ED Triage Notes (Signed)
Pt reports trip and fall 3/5, hit head, had black eye to right side and hematoma to right temple area, has had headaches since then. Last night she felt like her hand would not work, with numbness, for around 45 minutes, then resolved. Pt was home alone.

## 2017-08-18 NOTE — Progress Notes (Signed)
[place] at [facility] [address] [place], [place] [address] [phone]  Date:  08/18/2017   Name:  [name]   DOB:  [birth year]   MRN:  [id number]  PCP:  Mosie Lukes, MD    Chief Complaint: Fall (Pt fell 1 week ago and hit her head, having dull headaches, and fatigue since the fall. Last night right hand went numb and painful and pt was unable to write. )   History of Present Illness:  [name] is a 67 y.o. very pleasant female patient who presents with the following:  Pt of Dr. Charlett Blake who had called in today regarding concerning sx after a fall.  She was advised to go to the ER but refused, scheduled to see me today  History of HTN, hyperlipidemia  Pt notes that she fell on the 5th of this month- she sprained her left ankle and hit the right side of her face over her right eye.  She fell outdoors on a cement slab, tripped over a stepping stone.    She has noted "dull headaches ever since and fatigue that comes and goes."  Last night while cooking she went to label the jar and was not able to write- this lasted for about 45 minutes.  She brings in examples of illegible writing which gradually improved until the sx resolved last night.  She now feels ok except for her HA   She also noted that her right index finger was numb and hard to control during this episodes Never had this in the past She continues to have dull headaches Never had a stroke or TIA in the past   She did not have LOC when she fell No change in her hearing or vision    Patient Active Problem List   Diagnosis Date Noted  . Obesity 02/08/2017  . Palpitations 02/04/2017  . Sleep apnea 10/01/2016  . Diverticulitis of colon (without mention of hemorrhage)(562.11) 01/26/2014  . Benign paroxysmal positional vertigo 11/14/2013  . Urinary frequency 09/02/2013  . Post-menopause 09/02/2013  . H/O tobacco use, presenting hazards to health  09/02/2013  . GERD (gastroesophageal reflux disease) 09/02/2013  . Personal history of colonic polyps 09/02/2013  . Preventative health care 11/28/2012  . RHINITIS 06/27/2010  . GLUCOSE INTOLERANCE 12/22/2007  . Hyperlipidemia 12/20/2007  . ESSENTIAL HYPERTENSION, BENIGN 12/20/2007  . ARTHRITIS 12/20/2007  . KNEE SPRAIN 12/20/2007  . CARCINOMA, BASAL CELL, HX OF 12/20/2007  . ARRHYTHMIA, HX OF 12/20/2007  . GLAUCOMA, HX OF 12/20/2007    Past Medical History:  Diagnosis Date  . Arthritis   . Benign paroxysmal positional vertigo 11/14/2013  . Depression   . Essential hypertension, benign   . GERD (gastroesophageal reflux disease) 09/02/2013  . Glaucoma   . Glucose intolerance (impaired glucose tolerance)   . H/O tobacco use, presenting hazards to health 09/02/2013  . History  of basal cell carcinoma   . History of cardiac arrhythmia   . Hyperlipidemia   . Medicare welcome visit 11/28/2012  . Obesity 02/08/2017  . Palpitations 02/04/2017  . Personal history of colonic polyps 09/02/2013   Patient reports H/O 2 colonoscopies, last one dates back to 2012 in [place] She reports they were labeled as incomplete.  Reports 1st colonoscopy had 1 benign polyp and the 2nd was clear.   . Sleep apnea 10/01/2016    Past Surgical History:  Procedure Laterality Date  . CERVICAL CONE BIOPSY  [place] OF UTERUS  1966  . KNEE SURGERY Left 11/01/2008   left knee total replacement Dr Sharol Given    Social History   Tobacco Use  . Smoking status: Former Smoker    Packs/day: 1.00    Years: 15.00    Pack years: 15.00    Types: Cigarettes    Last attempt to quit: 06/08/1982    Years since quitting: 35.2  . Smokeless tobacco: Never Used  Substance Use Topics  . Alcohol use: Yes    Comment: wine w/ dinner 'once in a while'  . Drug use: No    Family History  Problem Relation Age of Onset  . Coronary artery disease Paternal Grandmother   . Hyperlipidemia Paternal Grandmother   .  Hypertension Paternal Grandmother   . Stroke Paternal Grandmother   . Hypertension Mother   . Lung cancer Mother        smoker  . Uterine cancer Mother   . Birth defects Mother        lung cancer in smoker  . COPD Mother   . Hypertension Father   . Kidney disease Father        Renal Cell Carcinoma  . Birth defects Father        renal cell CA, smoker  . Hypertension Maternal Grandmother   . Hypertension Maternal Grandfather   . Hypertension Paternal Grandfather   . Dementia Paternal Grandfather   . Cancer Brother        testicular  . Heart disease Brother        NI, smoker  . Hyperlipidemia Unknown   . Colon cancer Neg Hx     No Known Allergies  Medication list has been reviewed and updated.  Current Outpatient Medications on File Prior to Visit  Medication Sig Dispense Refill  . aspirin 81 MG tablet Take 81 mg by mouth daily.      . fish oil-omega-3 fatty acids 1000 MG capsule Take 2 g by mouth daily.     . hydrochlorothiazide (MICROZIDE) 12.5 MG capsule Take 1 capsule (12.5 mg total) by mouth daily. 90 capsule 3  . ibuprofen (ADVIL,MOTRIN) 200 MG tablet Take 200 mg by mouth every 6 (six) hours as needed for pain.    Marland Kitchen lovastatin (MEVACOR) 20 MG tablet Take 2 tablets (40 mg total) by mouth at bedtime. 180 tablet 3  . metoprolol tartrate (LOPRESSOR) 50 MG tablet Take 1 tablet (50 mg total) by mouth 2 (two) times daily. 180 tablet 3  . tretinoin (RETIN-A) 0.1 % cream APPLY A PEA-SIZED AMOUNT TOPICALLY TO FACE AT BEDTIME 45 g 5   No current facility-administered medications on file prior to visit.     Review of Systems:  As per HPI- otherwise negative.   Physical Examination: Vitals:   08/18/17 1059  BP: 126/82  Pulse: 70  Temp: 97.6 F (36.4 C)  SpO2: 95%   Vitals:   08/18/17 1059  Weight: 181 lb 9.6 oz (82.4 kg)  Height: 4' 11.5" (1.511 m)   Body mass index is 36.06 kg/m. Ideal Body Weight: Weight in (lb) to have BMI = 25: 125.6  GEN: WDWN, NAD,  Non-toxic, A & O x 3, obese, looks well  HEENT: Atraumatic, Normocephalic. Neck supple. No masses, No LAD.  Bilateral TM wnl, oropharynx normal.  PEERL,EOMI.   Ears and Nose: No external deformity. CV: RRR, No M/G/R. No JVD. No thrill. No extra heart sounds. PULM: CTA B, no wheezes, crackles, rhonchi.  No retractions. No resp. distress. No accessory muscle use. ABD: S, NT, ND, +BS EXTR: No c/c/e NEURO Normal gait.   Normal strength, sensation and DTR of all limbs, normal facial movement  PSYCH: Normally interactive. Conversant. Not depressed or anxious appearing.  Calm demeanor.  Black eye and bruising right forehead, healing Some swelling and mild tenderness of her left ankle, much better per her report She is not limping and has normal ROM of her ankle    Assessment and Plan: Altered mental status, unspecified altered mental status type  Sprain of left ankle, unspecified ligament, initial encounter  Pt fell a week ago and hit her head.  Has noted HA since.  Then last night she had a 45 minute episode where she was unable to write.  She brings in examples of her writing during that time which is not legible.   These sx are now resolved, but clearly she needs further evaluation.  Referral to ER now.   Signed Lamar Blinks, MD

## 2017-08-18 NOTE — Patient Instructions (Signed)
I am concerned that you may have sustained a serious injury to your head in your recent fall. Please go to the ER downstairs for evaluation

## 2017-08-19 ENCOUNTER — Observation Stay (HOSPITAL_COMMUNITY): Payer: Medicare HMO

## 2017-08-19 ENCOUNTER — Encounter (HOSPITAL_COMMUNITY): Payer: Self-pay | Admitting: General Practice

## 2017-08-19 DIAGNOSIS — R29818 Other symptoms and signs involving the nervous system: Secondary | ICD-10-CM | POA: Diagnosis not present

## 2017-08-19 DIAGNOSIS — S0990XA Unspecified injury of head, initial encounter: Secondary | ICD-10-CM | POA: Diagnosis not present

## 2017-08-19 DIAGNOSIS — I6522 Occlusion and stenosis of left carotid artery: Secondary | ICD-10-CM | POA: Diagnosis not present

## 2017-08-19 DIAGNOSIS — E785 Hyperlipidemia, unspecified: Secondary | ICD-10-CM | POA: Diagnosis not present

## 2017-08-19 DIAGNOSIS — I10 Essential (primary) hypertension: Secondary | ICD-10-CM | POA: Diagnosis not present

## 2017-08-19 DIAGNOSIS — G458 Other transient cerebral ischemic attacks and related syndromes: Secondary | ICD-10-CM | POA: Diagnosis not present

## 2017-08-19 LAB — LIPID PANEL
CHOL/HDL RATIO: 3.6 ratio
Cholesterol: 153 mg/dL (ref 0–200)
HDL: 43 mg/dL (ref >40)
LDL CALC: 88 mg/dL (ref 0–99)
Triglycerides: 110 mg/dL (ref <150)
VLDL: 22 mg/dL (ref 0–40)

## 2017-08-19 LAB — HEMOGLOBIN A1C
HEMOGLOBIN A1C: 5.3 % (ref 4.8–5.6)
MEAN PLASMA GLUCOSE: 105.41 mg/dL

## 2017-08-19 LAB — ECHOCARDIOGRAM COMPLETE
HEIGHTINCHES: 60 in
Weight: 2896 oz

## 2017-08-19 MED ORDER — CLOPIDOGREL BISULFATE 75 MG PO TABS: 75.0000 mg | ORAL_TABLET | Freq: Every day | ORAL | 0 refills | Status: AC

## 2017-08-19 MED ORDER — ASPIRIN EC 81 MG PO TBEC
81.0000 mg | DELAYED_RELEASE_TABLET | Freq: Every day | ORAL | Status: DC
  Administered 2017-08-20: 81 mg via ORAL
  Filled 2017-08-19: qty 1

## 2017-08-19 MED ORDER — CLOPIDOGREL BISULFATE 75 MG PO TABS
75.0000 mg | ORAL_TABLET | Freq: Every day | ORAL | Status: DC
  Administered 2017-08-19 – 2017-08-20 (×2): 75 mg via ORAL
  Filled 2017-08-19 (×2): qty 1

## 2017-08-19 MED ORDER — HYDRALAZINE HCL 20 MG/ML IJ SOLN: 10.0000 mg | Freq: Four times a day (QID) | INTRAMUSCULAR | Status: DC | PRN

## 2017-08-19 MED ORDER — OXYMETAZOLINE HCL 0.05 % NA SOLN
1.0000 | Freq: Two times a day (BID) | NASAL | Status: DC
  Administered 2017-08-19 – 2017-08-20 (×4): 1 via NASAL
  Filled 2017-08-19: qty 15

## 2017-08-19 MED ORDER — METOPROLOL TARTRATE 50 MG PO TABS
50.0000 mg | ORAL_TABLET | Freq: Two times a day (BID) | ORAL | Status: DC
  Administered 2017-08-19 – 2017-08-20 (×2): 50 mg via ORAL
  Filled 2017-08-19 (×2): qty 1

## 2017-08-19 MED ORDER — SODIUM CHLORIDE 0.9 % IV SOLN
INTRAVENOUS | Status: AC
  Administered 2017-08-19: 19:00:00 via INTRAVENOUS

## 2017-08-19 MED ORDER — IOPAMIDOL (ISOVUE-370) INJECTION 76%
INTRAVENOUS | Status: AC
  Administered 2017-08-19: 50 mL
  Filled 2017-08-19: qty 50

## 2017-08-19 NOTE — Care Management Note (Signed)
Case Management Note  Patient Details  Name: [name] MRN: [id number] Date of Birth: May 23, 1951  Subjective/Objective:      Pt in to r/o CVA. She is from home with a friend. She also fell recently hitting her head. Hx:  hypertension, hyperlipidemia, and palpitations. PCP: Dr Randel Pigg            Action/Plan: Awaiting PT/OT evals. CM following for d/c needs, physician orders.   Expected Discharge Date:                  Expected Discharge Plan:     In-House Referral:     Discharge planning Services     Post Acute Care Choice:    Choice offered to:     DME Arranged:    DME Agency:     HH Arranged:    HH Agency:     Status of Service:  In process, will continue to follow  If discussed at Long Length of Stay Meetings, dates discussed:    Additional Comments:  Pollie Friar, RN 08/19/2017, 11:31 AM

## 2017-08-19 NOTE — Progress Notes (Addendum)
STROKE TEAM PROGRESS NOTE   SUBJECTIVE (INTERVAL HISTORY) No family is at the bedside. She states she feels back to normal. She is anxious to go home.   OBJECTIVE Vitals:   08/18/17 2200 08/19/17 0000 08/19/17 0338 08/19/17 0747  BP: (!) 150/60 (!) 171/63  (!) 144/62  Pulse: 69 82 67 67  Resp: 18 18 18 18   Temp:   98.8 F (37.1 C) 97.8 F (36.6 C)  TempSrc:   Oral Oral  SpO2: 97%     Weight:      Height:        CBC:  Recent Labs  Lab 08/18/17 1352  WBC 6.4  NEUTROABS 3.2  HGB 13.2  HCT 39.3  MCV 92.3  PLT 433    Basic Metabolic Panel:  Recent Labs  Lab 08/18/17 1352  NA 137  K 3.6  CL 101  CO2 26  GLUCOSE 107*  BUN 30*  CREATININE 1.06*  CALCIUM 9.5    Lipid Panel:     Component Value Date/Time   CHOL 153 08/19/2017 0403   TRIG 110 08/19/2017 0403   HDL 43 08/19/2017 0403   CHOLHDL 3.6 08/19/2017 0403   VLDL 22 08/19/2017 0403   LDLCALC 88 08/19/2017 0403   HgbA1c:  Lab Results  Component Value Date   HGBA1C 5.3 08/19/2017   Urine Drug Screen:     Component Value Date/Time   LABOPIA NONE DETECTED 08/18/2017 1200   COCAINSCRNUR NONE DETECTED 08/18/2017 1200   COCAINSCRNUR NEGATIVE 03/16/2007 1338   LABBENZ NONE DETECTED 08/18/2017 1200   LABBENZ NEGATIVE 03/16/2007 1338   AMPHETMU NONE DETECTED 08/18/2017 1200   THCU NONE DETECTED 08/18/2017 1200   LABBARB NONE DETECTED 08/18/2017 1200    Alcohol Level     Component Value Date/Time   ETH <10 08/18/2017 1352    IMAGING  Ct Head Wo Contrast 08/18/2017 Mild atrophic changes without acute abnormality.   Ct Angio Head W Or Wo Contrast Ct Angio Neck W Or Wo Contrast 08/19/2017 1. No large vessel occlusion or significant intracranial stenosis. 2. 45% proximal left ICA stenosis. 3.  Aortic Atherosclerosis (ICD10-I70.0).   MRI pending    PHYSICAL EXAM Patient alert and oriented x 3. Speech clear. No aphasia. No dysarthria. Extraoccular movements intact. Visual fields full. Face  symmetric. Tongue midline. Moves all extremities x 4. Strength normal. Coordination normal. Sensation intact. Heart rate regular. Breath sounds clear. Gait steady. Rhomberg negative.    ASSESSMENT/PLAN Ms. [name] is a 67 y.o. female with history of HTN, HLD, OSA, obesity, and arrythmia presenting with R hand weakness and numbness leading to difficulty writing. She did not receive IV t-PA.   Possible L brain Stroke vs TIA  Resultant  Deficits resolved  CT head atropy  CTA head and neck no LVO, L ICA 45% stenosis, aortic atherosclerosis  MRI head pending. Med on call d/t claustrophobia    2D Echo  EF 55-60%. No source of embolus   LDL 88  HgbA1c 5.3  Lovenox 40 mg sq daily for VTE prophylaxis  Fall precautions  Diet Heart Room service appropriate? Yes; Fluid consistency: Thin  No antithrombotic prior to admission (had taken aspirin in the past but had stopped taking), now on aspirin 325 mg daily. Recommend change to aspirin 81 and plavix 75 mg daily x 3 weeks then aspirin alone. Orders placed.  Patient counseled to be compliant with her antithrombotic medications  Ongoing aggressive stroke risk factor management  Therapy recommendations:  No therapy  needs  Disposition:  Return home  Hypertension  Stable  Permissive hypertension (OK if < 220/120) but gradually normalize in 5-7 days  Long-term BP goal normotensive  Hyperlipidemia  Home meds:  Omega 3 & mevacor 20, resumed in hospital  LDL 88, goal < 70  Continue statin at discharge  Other Stroke Risk Factors  Advanced age  Former Cigarette smoker  ETOH use. UDS / ETOH level negative   Obesity, Body mass index is 35.35 kg/m., recommend weight loss, diet and exercise as appropriate   Family hx stroke (Paternal Grandmother)  Other Active Problems  Hx palpitations - reports a long history of short, sporadic bursts (5-10 seconds) of irregular rhythm. No known hx atrial fibrillation. Past EKGs ok per  pt. Admission EKG NSR. Tele NSR. May recommend loop monitor if MRI positive for stroke or may consider OP 30 day cardiac event monitoring if MRI negative.   Hospital day # [place] for Pager information 08/19/2017 2:59 PM   ATTENDING NOTE: I reviewed above note and agree with the assessment and plan. I have made any additions or clarifications directly to the above note. Pt was seen and examined.   67 year old female with history of hypertension, hyperlipidemia, palpitation, obesity, OSA, admitted for transient episode of right hand weakness, difficulty writing and right hand numbness.  Resolved in 45 minutes.  CT negative for acute abnormality.  LDL 88 A1c 5.3 UDS negative.  CTA head neck showed left ICA proximal soft and calcified plaque with 45% stenosis. However, on my reviewing of the image, about 50-55% stenosis. Will do CUS to further evaluate. EF 55-60%.  Carotid Doppler and MRI pending.  Patient symptoms most likely related to left ICA bifurcation atherosclerosis, however radiology report in less than 50% stenosis, vascular surgery so far does not recommend CEA.  Will recommend aspirin and Plavix dual antiplatelet for 3 weeks and then Plavix alone.  Due to history of palpitation, recommend loop recorder vs. 30-day cardio event monitoring as outpatient.  Will follow.  Rosalin Hawking, MD PhD Stroke Neurology 08/19/2017 5:42 PM     To contact Stroke Continuity provider, please refer to [email or web address] After hours, contact General Neurology

## 2017-08-19 NOTE — Evaluation (Signed)
Occupational Therapy Evaluation Patient Details Name: [name] MRN: [id number] DOB: [birth year] Today's Date: 08/19/2017    History of Present Illness Pt fell a week ago and hit her head.  Has noted HA since.  Then last night she had a 45 minute episode where she was unable to write.  She brings in examples of her writing during that time which is not legible   Clinical Impression   Pt is at independent, baseline level of function. Pt without visual or UE ROM/coordination impairments. All education completed and no further acute OT is indicated at this time    Follow Up Recommendations  No OT follow up    Equipment Recommendations  None recommended by OT    Recommendations for Other Services       Precautions / Restrictions Precautions Precautions: None Restrictions Weight Bearing Restrictions: No      Mobility Bed Mobility Overal bed mobility: Independent                Transfers Overall transfer level: Independent                    Balance Overall balance assessment: No apparent balance deficits (not formally assessed)                                         ADL either performed or assessed with clinical judgement   ADL Overall ADL's : At baseline;Independent                                             Vision Baseline Vision/History: No visual deficits Patient Visual Report: No change from baseline       Perception     Praxis      Pertinent Vitals/Pain Pain Assessment: No/denies pain     Hand Dominance Right   Extremity/Trunk Assessment Upper Extremity Assessment Upper Extremity Assessment: Overall WFL for tasks assessed   Lower Extremity Assessment Lower Extremity Assessment: Defer to PT evaluation   Cervical / Trunk Assessment Cervical / Trunk Assessment: Normal   Communication Communication Communication: No difficulties   Cognition Arousal/Alertness: Awake/alert Behavior During  Therapy: WFL for tasks assessed/performed Overall Cognitive Status: Within Functional Limits for tasks assessed                                     General Comments       Exercises Other Exercises Other Exercises: Pt able to write 4 sentences legibly, without difficulty   Shoulder Instructions      Home Living Family/patient expects to be discharged to:: Private residence Living Arrangements: Non-relatives/Friends   Type of Home: House       Home Layout: One level     Bathroom Shower/Tub: Tub/shower unit;Walk-in Psychologist, prison and probation services: Standard     Home Equipment: None      Lives With: Alone    Prior Functioning/Environment Level of Independence: Independent                 OT Problem List: Decreased coordination      OT Treatment/Interventions:      OT Goals(Current goals can be found in the care plan section) Acute Rehab OT  Goals Patient Stated Goal: go home  OT Frequency:     Barriers to D/C:    no barriers       Co-evaluation              AM-PAC PT "6 Clicks" Daily Activity     Outcome Measure Help from another person eating meals?: None Help from another person taking care of personal grooming?: None Help from another person toileting, which includes using toliet, bedpan, or urinal?: None Help from another person bathing (including washing, rinsing, drying)?: None Help from another person to put on and taking off regular upper body clothing?: None Help from another person to put on and taking off regular lower body clothing?: None 6 Click Score: 24   End of Session    Activity Tolerance: Patient tolerated treatment well Patient left: in chair  OT Visit Diagnosis: Other symptoms and signs involving the nervous system (R29.898)                Time: 8676-1950 OT Time Calculation (min): 16 min Charges:  OT General Charges $OT Visit: 1 Visit OT Evaluation $OT Eval Low Complexity: 1 Low G-Codes: OT G-codes **NOT  FOR INPATIENT CLASS** Functional Assessment Tool Used: AM-PAC 6 Clicks Daily Activity     Britt Bottom 08/19/2017, 2:18 PM

## 2017-08-19 NOTE — Progress Notes (Signed)
Patient returned from MRI, and has some concerns about staying, due to the fact patient was informed that she is observation status. Patient would like to go home if possible, MD notified of patients concerns.

## 2017-08-19 NOTE — Consult Note (Addendum)
Hospital Consult    Reason for Consult:  stroke Requesting Physician:  Carolin Sicks MRN #:  [id number]  History of Present Illness: This is a 67 y.o. female who states she was making homemade BBQ sauce and picked up a pen to label the jar and had right hand weakness that she said involved her right thumb and 1st and 2nd fingers.  She monitored her handwriting and it resolved in about 45 minutes.  She states she has never had any similar symptoms and denies and transient blindness or speech difficulties.    She has a remote tobacco use.   She is on a daily aspirin and the dose has been increased to 325mg  daily.  The pt is on a statin for cholesterol management.   She is on a beta blocker for blood pressure management.   Past Medical History:  Diagnosis Date  . Arthritis   . Benign paroxysmal positional vertigo 11/14/2013  . Depression   . Essential hypertension, benign   . GERD (gastroesophageal reflux disease) 09/02/2013  . Glaucoma   . Glucose intolerance (impaired glucose tolerance)   . H/O tobacco use, presenting hazards to health 09/02/2013  . History  of basal cell carcinoma   . History of cardiac arrhythmia   . Hyperlipidemia   . Medicare welcome visit 11/28/2012  . Obesity 02/08/2017  . Palpitations 02/04/2017  . Personal history of colonic polyps 09/02/2013   Patient reports H/O 2 colonoscopies, last one dates back to 2012 in [place] She reports they were labeled as incomplete.  Reports 1st colonoscopy had 1 benign polyp and the 2nd was clear.   . Sleep apnea 10/01/2016    Past Surgical History:  Procedure Laterality Date  . CERVICAL CONE BIOPSY  1960s  . DILATION AND CURETTAGE OF UTERUS  1966  . KNEE SURGERY Left 11/01/2008   left knee total replacement Dr Sharol Given    No Known Allergies  Prior to Admission medications   Medication Sig Start Date End Date Taking? Authorizing Provider  calcium carbonate (TUMS - DOSED IN MG ELEMENTAL CALCIUM) 500 MG chewable tablet Chew 1 tablet by  mouth daily.   Yes [provider]  fish oil-omega-3 fatty acids 1000 MG capsule Take 2 g by mouth daily.    Yes [provider]  hydrochlorothiazide (MICROZIDE) 12.5 MG capsule Take 1 capsule (12.5 mg total) by mouth daily. 05/14/17  Yes Mosie Lukes, MD  ibuprofen (ADVIL,MOTRIN) 200 MG tablet Take 200 mg by mouth every 6 (six) hours as needed for pain.   Yes [provider]  lovastatin (MEVACOR) 20 MG tablet Take 2 tablets (40 mg total) by mouth at bedtime. 05/14/17  Yes Mosie Lukes, MD  metoprolol tartrate (LOPRESSOR) 50 MG tablet Take 1 tablet (50 mg total) by mouth 2 (two) times daily. 05/14/17  Yes Mosie Lukes, MD  oxymetazoline (AFRIN) 0.05 % nasal spray Place 2 sprays into both nostrils as needed for congestion.   Yes [provider]  tretinoin (RETIN-A) 0.1 % cream APPLY A PEA-SIZED AMOUNT TOPICALLY TO FACE AT BEDTIME 11/18/16  Yes Mosie Lukes, MD  aspirin 81 MG tablet Take 81 mg by mouth daily.      [provider]    Social History   Socioeconomic History  . Marital status: Divorced    Spouse name: Not on file  . Number of children: Not on file  . Years of education: Not on file  . Highest education level: Not on file  Social  Needs  . Financial resource strain: Not on file  . Food insecurity - worry: Not on file  . Food insecurity - inability: Not on file  . Transportation needs - medical: Not on file  . Transportation needs - non-medical: Not on file  Occupational History  . Not on file  Tobacco Use  . Smoking status: Former Smoker    Packs/day: 1.00    Years: 15.00    Pack years: 15.00    Types: Cigarettes    Last attempt to quit: 06/08/1982    Years since quitting: 35.2  . Smokeless tobacco: Never Used  Substance and Sexual Activity  . Alcohol use: Yes    Comment: wine w/ dinner 'once in a while'  . Drug use: No  . Sexual activity: No    Comment: lives alone, no dietary restrictions except no lactose,  Other  Topics Concern  . Not on file  Social History Narrative   Occupation: unemployed Product manager- now retired   Divorced   no children    Moved from [place] alone with 4 cats   Enjoys gardening, planting.               Family History  Problem Relation Age of Onset  . Coronary artery disease Paternal Grandmother   . Hyperlipidemia Paternal Grandmother   . Hypertension Paternal Grandmother   . Stroke Paternal Grandmother   . Hypertension Mother   . Lung cancer Mother        smoker  . Uterine cancer Mother   . Birth defects Mother        lung cancer in smoker  . COPD Mother   . Hypertension Father   . Kidney disease Father        Renal Cell Carcinoma  . Birth defects Father        renal cell CA, smoker  . Hypertension Maternal Grandmother   . Hypertension Maternal Grandfather   . Hypertension Paternal Grandfather   . Dementia Paternal Grandfather   . Cancer Brother        testicular  . Heart disease Brother        NI, smoker  . Hyperlipidemia Unknown   . Colon cancer Neg Hx     ROS: [x]  Positive   [ ]  Negative   [ ]  All sytems reviewed and are negative  Cardiac: []  chest pain/pressure []  palpitations []  SOB lying flat []  DOE  Vascular: []  pain in legs while walking []  pain in legs at rest []  pain in legs at night []  non-healing ulcers []  hx of DVT [x]  swelling in legs  Pulmonary: []  productive cough []  asthma/wheezing []  home O2  Neurologic: [x]  weakness in right hand []  numbness in []  arms []  legs []  hx of CVA []  mini stroke [] difficulty speaking or slurred speech []  temporary loss of vision in one eye []  dizziness  Hematologic: []  hx of cancer []  bleeding problems []  problems with blood clotting easily  Endocrine:   []  diabetes []  thyroid disease  GI []  vomiting blood []  blood in stool  GU: []  CKD/renal failure []  HD--[]  M/W/F or []  T/T/S []  burning with urination []  blood in urine  Psychiatric: []  anxiety []   depression  Musculoskeletal: []  arthritis []  joint pain [x]  sprained ankle  Integumentary: []  rashes []  ulcers  Constitutional: []  fever []  chills   Physical Examination  Vitals:   08/19/17 1145 08/19/17 1551  BP: (!) 167/84 (!) 181/89  Pulse: 75 85  Resp: 18 18  Temp: 97.7 F (36.5 C) 97.8 F (36.6 C)  SpO2: 98% 97%   Body mass index is 35.35 kg/m.  General:  WDWN in NAD Gait: Not observed HENT: WNL, normocephalic Pulmonary: normal non-labored breathing, without Rales, rhonchi,  wheezing Cardiac: regular, without  Murmurs, rubs or gallops; without carotid bruits Skin: without rashes Vascular Exam/Pulses:  Right Left  Radial 2+ (normal) 2+ (normal)  DP 2+ (normal) 2+ (normal)  PT 2+ (normal) 2+ (normal)   Extremities: without ischemic changes, without Gangrene , without cellulitis; without open wounds; mild swelling BLE Musculoskeletal: no muscle wasting or atrophy  Neurologic: A&O X 3;  No focal weakness or paresthesias are detected; speech is fluent/normal Psychiatric:  The pt has Normal affect. Lymph : No Cervical, Axillary, or Inguinal lymphadenopathy    CBC    Component Value Date/Time   WBC 6.4 08/18/2017 1352   RBC 4.26 08/18/2017 1352   HGB 13.2 08/18/2017 1352   HCT 39.3 08/18/2017 1352   PLT 213 08/18/2017 1352   MCV 92.3 08/18/2017 1352   MCH 31.0 08/18/2017 1352   MCHC 33.6 08/18/2017 1352   RDW 13.6 08/18/2017 1352   LYMPHSABS 2.6 08/18/2017 1352   MONOABS 0.5 08/18/2017 1352   EOSABS 0.1 08/18/2017 1352   BASOSABS 0.0 08/18/2017 1352    BMET    Component Value Date/Time   NA 137 08/18/2017 1352   K 3.6 08/18/2017 1352   CL 101 08/18/2017 1352   CO2 26 08/18/2017 1352   GLUCOSE 107 (H) 08/18/2017 1352   BUN 30 (H) 08/18/2017 1352   CREATININE 1.06 (H) 08/18/2017 1352   CREATININE 1.00 01/26/2014 1438   CALCIUM 9.5 08/18/2017 1352   GFRNONAA 52 (L) 08/18/2017 1352   GFRNONAA 68 11/28/2012 1146   GFRAA >60 08/18/2017 1352    GFRAA 78 11/28/2012 1146    COAGS: Lab Results  Component Value Date   INR 1.01 08/18/2017   INR 2.1 (H) 11/05/2008   INR 1.9 (H) 11/04/2008     Non-Invasive Vascular Imaging:   CTA head/neck 08/19/17: IMPRESSION: 1. No large vessel occlusion or significant intracranial stenosis. 2. 45% proximal left ICA stenosis. 3.  Aortic Atherosclerosis  2D echocardiogram 08/19/17: Study Conclusions  - Left ventricle: The cavity size was normal. Systolic function was   normal. The estimated ejection fraction was in the range of 55%   to 60%. Wall motion was normal; there were no regional wall   motion abnormalities. Doppler parameters are consistent with   abnormal left ventricular relaxation (grade 1 diastolic   dysfunction). - Mitral valve: Mildly calcified annulus.  Impressions:  - No cardiac source of emboli was indentified.  Statin:  Yes.   Beta Blocker:  Yes.   Aspirin:  Yes.   ACEI:  No. ARB:  No. CCB use:  No Other antiplatelets/anticoagulants:  Yes. Plavix   ASSESSMENT/PLAN: This is a 67 y.o. female with right hand weakness related to TIA.  VVS consulted for 45% left ICA carotid artery stenosis on CTA.   -Dr. Bridgett Larsson reviewed CTA and agrees with 45% left ICA stenosis.  This does not reveal an ulcerated plaque and her TIA sx are most likely not related to her carotid and would not recommend surgery/intervention.  Her neuro exam is completely normal.  Agree with aspirin/statin.     Leontine Locket, PA-C [facility] [phone]   Addendum  I have independently interviewed and examined the patient, and I agree with the physician assistant's findings.  Pt's TIA corresponds to a L sided cortical event.  Pt's CTA demonstrates a non-ulcerated, calcified plaque in the proximal L ICA.  I agree that the stenosis is unlikely to be >50%.  Per NASCET, there would be no advantage to proceeding with L CEA for ICA <50%.  - Neurology completing the work-up in this  patient - Available as needed.  Adele Barthel, MD, FACS [facility] Office: [phone] Pager: [phone]  08/19/2017, 4:27 PM

## 2017-08-19 NOTE — Progress Notes (Signed)
Speech Language Pathology Evaluation Patient Details Name: [name] MRN: [id number] DOB: 07/11/1952 Today's Date: 08/19/2017 Time: 0175-1025 SLP Time Calculation (min) (ACUTE ONLY): 23 min  Problem List:      Patient Active Problem List   Diagnosis Date Noted  . Dysarthria 08/19/2017  . Elevated troponin 08/18/2017  . TIA (transient ischemic attack) 08/18/2017  . Essential hypertension 08/18/2017  . CVA (cerebral vascular accident) ([place]) 08/18/2017   Past Medical History:      Past Medical History:  Diagnosis Date  . CHF (congestive heart failure) ([place])   . Hypercholesteremia   . Hypertension    Past Surgical History: History reviewed. No pertinent surgical history. HPI:  67 yo female adm to [facility] with transient writing deficits involving right hand.  Pt CT head negative, MRI pending and pt for ECHO.  Pt PMH + for GERD, remote smoker - quit 35 years ago, basal cell carcinoma, obesity, OSA.  Pt states her symptoms have resolved and showed SLP writing.   Assessment / Plan / Recommendation Clinical Impression  Pt presents with functional cognitive linguistic abilities based on MOCA 7.1 with her scoring 28/30 (26/30 normal).  Areas of mild difficulty included recall - pt recalled 3/5 words independently, 1/5 with category cue and did not recognize 1/5 with multiple choice.   Pt's speech is fluent without evidence of dysarthria.  Facial asymmetry on left due to prior facial injury as a child.  No SlP follow up indicated.      SLP Assessment  SLP Recommendation/Assessment: Patient does not need any further Speech Lanaguage Pathology Services    Follow Up Recommendations  None    Frequency and Duration     n/a      SLP Evaluation Cognition  Overall Cognitive Status: Within Functional Limits for tasks assessed Arousal/Alertness: Awake/alert Orientation Level: Oriented X4 Memory: Impaired Memory Impairment: Retrieval deficit(recalled 3/5 words  independently, 1/5 with cue, did not recognize 1/5 with multiple choice cues) Awareness: Appears intact Problem Solving: Appears intact Safety/Judgment: Appears intact       Comprehension  Auditory Comprehension Overall Auditory Comprehension: Appears within functional limits for tasks assessed Yes/No Questions: Not tested Commands: Within Functional Limits Conversation: Complex Visual Recognition/Discrimination Discrimination: Within Function Limits Reading Comprehension Reading Status: Within funtional limits    Expression Expression Primary Mode of Expression: Verbal Verbal Expression Overall Verbal Expression: Appears within functional limits for tasks assessed Initiation: No impairment Repetition: No impairment Naming: No impairment Pragmatics: No impairment Written Expression Dominant Hand: Right Written Expression: Within Functional Limits   Oral / Motor  Oral Motor/Sensory Function Overall Oral Motor/Sensory Function: Within functional limits Motor Speech Overall Motor Speech: Appears within functional limits for tasks assessed Respiration: Within functional limits Resonance: Within functional limits Articulation: Within functional limitis Intelligibility: Intelligible Motor Planning: Witnin functional limits   GO  Macario Golds 08/19/2017, 8:30 AM   Luanna Salk, [facility] SLP [phone]

## 2017-08-19 NOTE — Care Management Obs Status (Signed)
[facility] NOTIFICATION   Patient Details  Name: [name] MRN: [id number] Date of Birth: [birth year]   Medicare Observation Status Notification Given:  Yes    Pollie Friar, RN 08/19/2017, 3:43 PM

## 2017-08-19 NOTE — Progress Notes (Signed)
PROGRESS NOTE    [name]  RXV:[id number] DOB: 02-09-1951 DOA: 08/18/2017 PCP: Mosie Lukes, MD   Brief Narrative: 67 year old female with history of hypertension, hyperlipidemia, palpitation presented to the ER with sudden onset of right hand numbness.  Admitted for TIA.  CT head negative for acute finding.  Evaluated by neurologist.  Assessment & Plan:   #Right hand numbness likely TIA: Symptoms lasted for about 45 minutes.  CT head negative for acute finding.  CT angios neck showed 45% proximal left ICA stenosis.  As per Dr.Xu from neurologist, the stenosis is actually more than 45% and asking to consult vascular surgery.  Vascular surgery team was consulted.  Waiting for MRI of the brain. -Echocardiogram with EF 55-60%, no source of emboli. -LDL 88, A1c 5.3 -Neurologist recommended aspirin 81 mg and Plavix 75 mg daily for 3 weeks and then aspirin alone. -Patient is ambulating and has no complaints.  Essential hypertension: Was on permissive hypertension.  Continue to monitor blood pressure.  Hyperlipidemia: LDL 88: Goal less than 70.  On pravastatin 40 mg.  Plan to discharge with current medication.  DVT prophylaxis with Lovenox subcutaneous.  DVT prophylaxis: Lovenox Code Status: Full code Family Communication: No family at bedside Disposition Plan: Waiting for vascular consult and MRI of the brain.  May go home today or tomorrow depending on full workup and neurologist plan.  Discussed with Dr. Erlinda Hong.    Consultants:   Neurology  Vascular surgery  Procedures: CT angiogram Antimicrobials: None  Subjective: Seen and examined at bedside.  Patient was seen in the morning around 10 AM.  She reported that the right hand weakness has improved.  Denies headache, dizziness, nausea vomiting.  Patient is ambulating in the room without any difficulty.  Objective: Vitals:   08/19/17 0000 08/19/17 0338 08/19/17 0747 08/19/17 1145  BP: (!) 171/63  (!) 144/62 (!) 167/84    Pulse: 82 67 67 75  Resp: 18 18 18 18   Temp:  98.8 F (37.1 C) 97.8 F (36.6 C) 97.7 F (36.5 C)  TempSrc:  Oral Oral Oral  SpO2:    98%  Weight:      Height:        Intake/Output Summary (Last 24 hours) at 08/19/2017 1547 Last data filed at 08/19/2017 1300 Gross per 24 hour  Intake 671.25 ml  Output -  Net 671.25 ml   Filed Weights   08/18/17 1149  Weight: 82.1 kg (181 lb)    Examination:  General exam: Appears calm and comfortable  Respiratory system: Clear to auscultation. Respiratory effort normal. No wheezing or crackle Cardiovascular system: S1 & S2 heard, RRR.  No pedal edema. Gastrointestinal system: Abdomen is nondistended, soft and nontender. Normal bowel sounds heard. Central nervous system: Alert and oriented. No focal neurological deficits. Extremities: Symmetric 5 x 5 power. Skin: No rashes, lesions or ulcers Psychiatry: Judgement and insight appear normal. Mood & affect appropriate.     Data Reviewed: I have personally reviewed following labs and imaging studies  CBC: Recent Labs  Lab 08/18/17 1352  WBC 6.4  NEUTROABS 3.2  HGB 13.2  HCT 39.3  MCV 92.3  PLT 509   Basic Metabolic Panel: Recent Labs  Lab 08/18/17 1352  NA 137  K 3.6  CL 101  CO2 26  GLUCOSE 107*  BUN 30*  CREATININE 1.06*  CALCIUM 9.5   GFR: Estimated Creatinine Clearance: 47.5 mL/min (A) (by C-G formula based on SCr of 1.06 mg/dL (H)). Liver Function Tests: Recent  Labs  Lab 08/18/17 1352  AST 24  ALT 23  ALKPHOS 46  BILITOT 0.6  PROT 7.2  ALBUMIN 4.3   No results for input(s): LIPASE, AMYLASE in the last 168 hours. No results for input(s): AMMONIA in the last 168 hours. Coagulation Profile: Recent Labs  Lab 08/18/17 1352  INR 1.01   Cardiac Enzymes: Recent Labs  Lab 08/18/17 1352  TROPONINI <0.03   BNP (last 3 results) No results for input(s): PROBNP in the last 8760 hours. HbA1C: Recent Labs    08/19/17 0403  HGBA1C 5.3   CBG: No results  for input(s): GLUCAP in the last 168 hours. Lipid Profile: Recent Labs    08/19/17 0403  CHOL 153  HDL 43  LDLCALC 88  TRIG 110  CHOLHDL 3.6   Thyroid Function Tests: No results for input(s): TSH, T4TOTAL, FREET4, T3FREE, THYROIDAB in the last 72 hours. Anemia Panel: No results for input(s): VITAMINB12, FOLATE, FERRITIN, TIBC, IRON, RETICCTPCT in the last 72 hours. Sepsis Labs: No results for input(s): PROCALCITON, LATICACIDVEN in the last 168 hours.  No results found for this or any previous visit (from the past 240 hour(s)).       Radiology Studies: Ct Angio Head W Or Wo Contrast  Result Date: 08/19/2017 CLINICAL DATA:  Transient right hand weakness and numbness. Recent head trauma. Possible TIA. EXAM: CT ANGIOGRAPHY HEAD AND NECK TECHNIQUE: Multidetector CT imaging of the head and neck was performed using the standard protocol during bolus administration of intravenous contrast. Multiplanar CT image reconstructions and MIPs were obtained to evaluate the vascular anatomy. Carotid stenosis measurements (when applicable) are obtained utilizing NASCET criteria, using the distal internal carotid diameter as the denominator. CONTRAST:  38mL ISOVUE-370 IOPAMIDOL (ISOVUE-370) INJECTION 76% COMPARISON:  None. FINDINGS: CTA NECK FINDINGS Aortic arch: Standard 3 vessel aortic arch with mild-to-moderate atherosclerotic plaque. Widely patent arch vessel origins. Right carotid system: Patent without evidence of significant stenosis or dissection. Mild, predominantly calcified plaque at the carotid bifurcation. Tortuous mid cervical ICA. Left carotid system: Patent with mixed calcified and soft plaque at the carotid bifurcation resulting in 45% proximal ICA stenosis. Vertebral arteries: Patent without evidence of stenosis or dissection. Mildly dominant right vertebral artery. Skeleton: Lower cervical disc degeneration, severe at C5-6 with prominent endplate sclerosis. Moderately advanced cervical  facet arthrosis including right-sided ankylosis at C2-3. Other neck: No mass or enlarged lymph nodes. Upper chest: Calcified mediastinal lymph nodes and 8 mm calcified left upper lobe lung nodule consistent with prior granulomatous infection. Review of the MIP images confirms the above findings CTA HEAD FINDINGS Anterior circulation: The internal carotid arteries are patent from skull base to carotid termini with mild nonstenotic siphon atherosclerosis predominantly on the left. ACAs and MCAs are patent without evidence of proximal branch occlusion or significant stenosis. No aneurysm or vascular malformation. Posterior circulation: The intracranial vertebral arteries are widely patent to the basilar. Right PICA origin is patent. Left PICA origin is not clearly identified. AICAs are small and not well evaluated. SCAs are grossly patent. The basilar artery is widely patent. There is a small right posterior communicating artery. PCAs are patent without evidence of significant stenosis. No aneurysm or vascular malformation. Venous sinuses: Patent. Anatomic variants: None. Delayed phase: No abnormal enhancement. Review of the MIP images confirms the above findings IMPRESSION: 1. No large vessel occlusion or significant intracranial stenosis. 2. 45% proximal left ICA stenosis. 3.  Aortic Atherosclerosis (ICD10-I70.0). Electronically Signed   By: Logan Bores M.D.   On: 08/19/2017  12:08   Ct Head Wo Contrast  Result Date: 08/18/2017 CLINICAL DATA:  Fall 1 week ago with right supraorbital injury EXAM: CT HEAD WITHOUT CONTRAST TECHNIQUE: Contiguous axial images were obtained from the base of the skull through the vertex without intravenous contrast. COMPARISON:  None. FINDINGS: Brain: Mild atrophic changes are noted. No findings to suggest acute hemorrhage, acute infarction or space-occupying mass lesion are noted. Vascular: No hyperdense vessel or unexpected calcification. Skull: Normal. Negative for fracture or focal  lesion. Sinuses/Orbits: No acute finding. Other: None. IMPRESSION: Mild atrophic changes without acute abnormality. Electronically Signed   By: Inez Catalina M.D.   On: 08/18/2017 14:12   Ct Angio Neck W Or Wo Contrast  Result Date: 08/19/2017 CLINICAL DATA:  Transient right hand weakness and numbness. Recent head trauma. Possible TIA. EXAM: CT ANGIOGRAPHY HEAD AND NECK TECHNIQUE: Multidetector CT imaging of the head and neck was performed using the standard protocol during bolus administration of intravenous contrast. Multiplanar CT image reconstructions and MIPs were obtained to evaluate the vascular anatomy. Carotid stenosis measurements (when applicable) are obtained utilizing NASCET criteria, using the distal internal carotid diameter as the denominator. CONTRAST:  33mL ISOVUE-370 IOPAMIDOL (ISOVUE-370) INJECTION 76% COMPARISON:  None. FINDINGS: CTA NECK FINDINGS Aortic arch: Standard 3 vessel aortic arch with mild-to-moderate atherosclerotic plaque. Widely patent arch vessel origins. Right carotid system: Patent without evidence of significant stenosis or dissection. Mild, predominantly calcified plaque at the carotid bifurcation. Tortuous mid cervical ICA. Left carotid system: Patent with mixed calcified and soft plaque at the carotid bifurcation resulting in 45% proximal ICA stenosis. Vertebral arteries: Patent without evidence of stenosis or dissection. Mildly dominant right vertebral artery. Skeleton: Lower cervical disc degeneration, severe at C5-6 with prominent endplate sclerosis. Moderately advanced cervical facet arthrosis including right-sided ankylosis at C2-3. Other neck: No mass or enlarged lymph nodes. Upper chest: Calcified mediastinal lymph nodes and 8 mm calcified left upper lobe lung nodule consistent with prior granulomatous infection. Review of the MIP images confirms the above findings CTA HEAD FINDINGS Anterior circulation: The internal carotid arteries are patent from skull base to  carotid termini with mild nonstenotic siphon atherosclerosis predominantly on the left. ACAs and MCAs are patent without evidence of proximal branch occlusion or significant stenosis. No aneurysm or vascular malformation. Posterior circulation: The intracranial vertebral arteries are widely patent to the basilar. Right PICA origin is patent. Left PICA origin is not clearly identified. AICAs are small and not well evaluated. SCAs are grossly patent. The basilar artery is widely patent. There is a small right posterior communicating artery. PCAs are patent without evidence of significant stenosis. No aneurysm or vascular malformation. Venous sinuses: Patent. Anatomic variants: None. Delayed phase: No abnormal enhancement. Review of the MIP images confirms the above findings IMPRESSION: 1. No large vessel occlusion or significant intracranial stenosis. 2. 45% proximal left ICA stenosis. 3.  Aortic Atherosclerosis (ICD10-I70.0). Electronically Signed   By: Logan Bores M.D.   On: 08/19/2017 12:08        Scheduled Meds: . [START ON 08/20/2017] aspirin EC  81 mg Oral Daily  . clopidogrel  75 mg Oral Daily  . enoxaparin (LOVENOX) injection  40 mg Subcutaneous Q24H  . oxymetazoline  1 spray Each Nare BID  . pravastatin  40 mg Oral q1800   Continuous Infusions:   LOS: 0 days    Kasi Lasky Tanna Furry, MD [facility] Pager [phone]  If 7PM-7AM, please contact night-coverage [email or web address] Password [facility] 08/19/2017, 3:47 PM

## 2017-08-19 NOTE — Progress Notes (Signed)
Patient taking to MRI, patient pre-medicated with Ativan before going to procedure by second nurse, primary nurse off unit.

## 2017-08-19 NOTE — Progress Notes (Signed)
PT Cancellation Note  Patient Details Name: [name] MRN: [id number] DOB: [birth year]   Cancelled Treatment:    Reason Eval/Treat Not Completed: PT screened, no needs identified, will sign off  Chart reviewed, spoke with patient, who reports full resolution of symptoms. Pt denies any involvement of dizziness, LE weakness, N/T, or other acute abnormality. Pt reports she has been AMB in the room and is at baseline, without acute deficits. No PT needs at this time.    12:11 PM, 08/19/17 Etta Grandchild, PT, DPT Physical Therapist - [place] [phone] (Pager)  [phone] (Office)     Alahna Dunne C 08/19/2017, 12:10 PM

## 2017-08-19 NOTE — Progress Notes (Signed)
RN called MRI department to inquire about MRI being done. MRI tech informed RN that patient is currently 3rd in line.

## 2017-08-19 NOTE — Consult Note (Signed)
Requesting Physician: Dr. Myna Hidalgo    Chief Complaint: Right hand weakness  History obtained from: Patient and Chart     HPI:                                                                                                                                       [name] is an 67 y.o. female clinical history of hypertension,  Hyperlipidemia and recent fall 1 week ago who presents to the emergency department after noticing her right hand felt weaker and she was having difficulty writing. This was associated with some numbness of right hand as well. No difficulty with language, denies any cramping of her hand. Symtpoms resolved after about 45 min. She was apparently seen by tele neurology who recommended admission for MRI and TIA workup.   Date last known well: 3.13.19 Time last known well: 8 pm tPA Given: no, symptoms resolved NIHSS: 0 Baseline MRS 0     Past Medical History:  Diagnosis Date  . Arthritis   . Benign paroxysmal positional vertigo 11/14/2013  . Depression   . Essential hypertension, benign   . GERD (gastroesophageal reflux disease) 09/02/2013  . Glaucoma   . Glucose intolerance (impaired glucose tolerance)   . H/O tobacco use, presenting hazards to health 09/02/2013  . History  of basal cell carcinoma   . History of cardiac arrhythmia   . Hyperlipidemia   . Medicare welcome visit 11/28/2012  . Obesity 02/08/2017  . Palpitations 02/04/2017  . Personal history of colonic polyps 09/02/2013   Patient reports H/O 2 colonoscopies, last one dates back to 2012 in [place] She reports they were labeled as incomplete.  Reports 1st colonoscopy had 1 benign polyp and the 2nd was clear.   . Sleep apnea 10/01/2016    Past Surgical History:  Procedure Laterality Date  . CERVICAL CONE BIOPSY  1960s  . DILATION AND CURETTAGE OF UTERUS  1966  . KNEE SURGERY Left 11/01/2008   left knee total replacement Dr Sharol Given    Family History  Problem Relation Age of Onset  . Coronary artery disease  Paternal Grandmother   . Hyperlipidemia Paternal Grandmother   . Hypertension Paternal Grandmother   . Stroke Paternal Grandmother   . Hypertension Mother   . Lung cancer Mother        smoker  . Uterine cancer Mother   . Birth defects Mother        lung cancer in smoker  . COPD Mother   . Hypertension Father   . Kidney disease Father        Renal Cell Carcinoma  . Birth defects Father        renal cell CA, smoker  . Hypertension Maternal Grandmother   . Hypertension Maternal Grandfather   . Hypertension Paternal Grandfather   . Dementia Paternal Grandfather   . Cancer Brother        testicular  .  Heart disease Brother        NI, smoker  . Hyperlipidemia Unknown   . Colon cancer Neg Hx    Social History:  reports that she quit smoking about 35 years ago. Her smoking use included cigarettes. She has a 15.00 pack-year smoking history. she has never used smokeless tobacco. She reports that she drinks alcohol. She reports that she does not use drugs.  Allergies: No Known Allergies  Medications:                                                                                                                        I reviewed home medications   ROS:                                                                                                                                     14 systems reviewed and negative except above    Examination:                                                                                                      General: Appears well-developed and well-nourished.  Psych: Affect appropriate to situation Eyes: No scleral injection HENT: No OP obstrucion Head: Normocephalic.  Cardiovascular: Normal rate and regular rhythm.  Respiratory: Effort normal and breath sounds normal to anterior ascultation GI: Soft.  No distension. There is no tenderness.  Skin: WDI   Neurological Examination Mental Status: Alert, oriented, thought content appropriate.   Speech fluent without evidence of aphasia. Able to follow 3 step commands without difficulty. Cranial Nerves: II: Visual fields grossly normal,  III,IV, VI: ptosis not present, extra-ocular motions intact bilaterally, pupils equal, round, reactive to light and accommodation V,VII: smile symmetric, facial light touch sensation normal bilaterally VIII: hearing normal bilaterally IX,X: uvula rises symmetrically XI: bilateral shoulder shrug XII: midline tongue extension Motor: Right : Upper extremity   5/5    Left:     Upper extremity   5/5  Lower extremity  5/5     Lower extremity   5/5 Tone and bulk:normal tone throughout; no atrophy noted Sensory: Pinprick and light touch intact throughout, bilaterally Deep Tendon Reflexes: 2+ and symmetric throughout Plantars: Right: downgoing   Left: downgoing Cerebellar: normal finger-to-nose, normal rapid alternating movements and normal heel-to-shin test Gait: normal gait and station     Lab Results: Basic Metabolic Panel: Recent Labs  Lab 08/18/17 1352  NA 137  K 3.6  CL 101  CO2 26  GLUCOSE 107*  BUN 30*  CREATININE 1.06*  CALCIUM 9.5    CBC: Recent Labs  Lab 08/18/17 1352  WBC 6.4  NEUTROABS 3.2  HGB 13.2  HCT 39.3  MCV 92.3  PLT 213    Coagulation Studies: Recent Labs    08/18/17 1352  LABPROT 13.2  INR 1.01    Imaging: Ct Head Wo Contrast  Result Date: 08/18/2017 CLINICAL DATA:  Fall 1 week ago with right supraorbital injury EXAM: CT HEAD WITHOUT CONTRAST TECHNIQUE: Contiguous axial images were obtained from the base of the skull through the vertex without intravenous contrast. COMPARISON:  None. FINDINGS: Brain: Mild atrophic changes are noted. No findings to suggest acute hemorrhage, acute infarction or space-occupying mass lesion are noted. Vascular: No hyperdense vessel or unexpected calcification. Skull: Normal. Negative for fracture or focal lesion. Sinuses/Orbits: No acute finding. Other: None. IMPRESSION:  Mild atrophic changes without acute abnormality. Electronically Signed   By: Inez Catalina M.D.   On: 08/18/2017 14:12     ASSESSMENT AND PLAN  67 y.o. female clinical history of hypertension,  Hyperlipidemia and recent fall 1 week ago who presents to the emergency  Department after noticing her right hand felt weaker and she was having difficulty writing for about 45 min.   R hand weakness/numbness  Agree with MRI Brain to r/o contusion from recent head trauma, acute stroke Agree with vascular imaging with MRA head and neck    [name] [facility] Pager Number [phone]

## 2017-08-20 ENCOUNTER — Encounter (HOSPITAL_COMMUNITY): Payer: Self-pay | Admitting: *Deleted

## 2017-08-20 ENCOUNTER — Observation Stay (HOSPITAL_BASED_OUTPATIENT_CLINIC_OR_DEPARTMENT_OTHER): Payer: Medicare HMO

## 2017-08-20 ENCOUNTER — Other Ambulatory Visit: Payer: Self-pay | Admitting: Neurology

## 2017-08-20 ENCOUNTER — Other Ambulatory Visit: Payer: Self-pay | Admitting: Cardiology

## 2017-08-20 DIAGNOSIS — I639 Cerebral infarction, unspecified: Secondary | ICD-10-CM

## 2017-08-20 DIAGNOSIS — I6522 Occlusion and stenosis of left carotid artery: Secondary | ICD-10-CM

## 2017-08-20 DIAGNOSIS — I10 Essential (primary) hypertension: Secondary | ICD-10-CM | POA: Diagnosis not present

## 2017-08-20 DIAGNOSIS — R29818 Other symptoms and signs involving the nervous system: Secondary | ICD-10-CM | POA: Diagnosis not present

## 2017-08-20 DIAGNOSIS — E785 Hyperlipidemia, unspecified: Secondary | ICD-10-CM | POA: Diagnosis not present

## 2017-08-20 DIAGNOSIS — R002 Palpitations: Secondary | ICD-10-CM | POA: Diagnosis not present

## 2017-08-20 NOTE — Progress Notes (Signed)
Pt discharging home with self care, tele and IV removed. Patient contacted ride, AVS reviewed, and prescription given.

## 2017-08-20 NOTE — Progress Notes (Addendum)
STROKE TEAM PROGRESS NOTE   SUBJECTIVE (INTERVAL HISTORY) Patient in bed. Bubbly and talkative. Stable over night. Delayed MRI neg for stroke. VVS do not recommend surgery.    OBJECTIVE Vitals:   08/19/17 1551 08/19/17 2000 08/20/17 0000 08/20/17 0400  BP: (!) 181/89 (!) 175/70 (!) 153/68 (!) 156/82  Pulse: 85 81 62 72  Resp: 18 18 18 20   Temp: 97.8 F (36.6 C) 97.8 F (36.6 C) 97.7 F (36.5 C) 97.6 F (36.4 C)  TempSrc: Tympanic Oral Oral Oral  SpO2: 97% 99% 96% 97%  Weight:      Height:        CBC:  Recent Labs  Lab 08/18/17 1352  WBC 6.4  NEUTROABS 3.2  HGB 13.2  HCT 39.3  MCV 92.3  PLT 413    Basic Metabolic Panel:  Recent Labs  Lab 08/18/17 1352  NA 137  K 3.6  CL 101  CO2 26  GLUCOSE 107*  BUN 30*  CREATININE 1.06*  CALCIUM 9.5    Lipid Panel:     Component Value Date/Time   CHOL 153 08/19/2017 0403   TRIG 110 08/19/2017 0403   HDL 43 08/19/2017 0403   CHOLHDL 3.6 08/19/2017 0403   VLDL 22 08/19/2017 0403   LDLCALC 88 08/19/2017 0403   HgbA1c:  Lab Results  Component Value Date   HGBA1C 5.3 08/19/2017   Urine Drug Screen:     Component Value Date/Time   LABOPIA NONE DETECTED 08/18/2017 1200   COCAINSCRNUR NONE DETECTED 08/18/2017 1200   COCAINSCRNUR NEGATIVE 03/16/2007 1338   LABBENZ NONE DETECTED 08/18/2017 1200   LABBENZ NEGATIVE 03/16/2007 1338   AMPHETMU NONE DETECTED 08/18/2017 1200   THCU NONE DETECTED 08/18/2017 1200   LABBARB NONE DETECTED 08/18/2017 1200    Alcohol Level     Component Value Date/Time   ETH <10 08/18/2017 1352    IMAGING  Ct Head Wo Contrast 08/18/2017 Mild atrophic changes without acute abnormality.   Ct Angio Head W Or Wo Contrast Ct Angio Neck W Or Wo Contrast 08/19/2017 1. No large vessel occlusion or significant intracranial stenosis. 2. 45% proximal left ICA stenosis. 3.  Aortic Atherosclerosis (ICD10-I70.0).   MRI 08/19/2017 No acute infarct. Small vessel disease. Atrophy.   Carotid  Doppler 1-39% ICA stenosis on the right. 40-59% and 60-79% ICA stenosis on the left.   PHYSICAL EXAM Patient alert and oriented x 3. Speech clear. No aphasia. No dysarthria. Extraoccular movements intact. Visual fields full. Face symmetric. Tongue midline. Moves all extremities x 4. Strength normal. Coordination normal. Sensation intact. Heart rate regular. Breath sounds clear.    ASSESSMENT/PLAN Ms. AUTUMNE KALLIO is a 67 y.o. female with history of HTN, HLD, OSA, obesity, and arrythmia presenting with R hand weakness and numbness leading to difficulty writing. She did not receive IV t-PA.   L brain TIA in setting of L ICA stenosis 45%  Resultant  Deficits resolved  CT head atropy  CTA head and neck no LVO, L ICA 45% stenosis, aortic atherosclerosis  MRI head No acute infarct. Small vessel disease. Atrophy.   2D Echo  EF 55-60%. No source of embolus   Carotid doppler L 40-59% stenosis, R 1-39% stenosis  LDL 88  HgbA1c 5.3  Lovenox 40 mg sq daily for VTE prophylaxis Fall precautions Diet Heart Room service appropriate? Yes; Fluid consistency: Thin Diet - low sodium heart healthy  No antithrombotic prior to admission (had taken aspirin in the past but had stopped taking), now on  aspirin 81 mg daily and clopidogrel 75 mg daily. x 3 weeks then aspirin alone.   Patient counseled to be compliant with her antithrombotic medications  Ongoing aggressive stroke risk factor management  Therapy recommendations:  No therapy needs  Disposition:  Return home  30d monitor recommended to assess for atrial fibrillation as an OP  Ok for discharge from stroke standpoint  Follow up [facility] in 4-6 weeks. Order placed.  Hypertension  Stable  Ok for BP goal normotensive  Hyperlipidemia  Home meds:  Omega 3 & mevacor 20, resumed in hospital  LDL 88, goal < 70  Continue statin at discharge  Other Stroke Risk Factors  Advanced age  Former Cigarette smoker  ETOH use. UDS / ETOH  level negative   Obesity, Body mass index is 35.35 kg/m., recommend weight loss, diet and exercise as appropriate   Family hx stroke (Paternal Grandmother)  obstructive sleep apnea   Other Active Problems  Hx palpitations - reports a long history of short, sporadic bursts (5-10 seconds) of irregular rhythm. No known hx atrial fibrillation. Past EKGs ok per pt. Admission EKG NSR. Tele NSR. Recommend OP 30 day cardiac event monitoring to look for atrial fibrillation.   Hospital day # [place] for Pager information 08/20/2017 8:08 AM   ATTENDING NOTE: I reviewed above note and agree with the assessment and plan. I have made any additions or clarifications directly to the above note. Pt was seen and examined.   67 year old female with history of hypertension, hyperlipidemia, palpitation, obesity, OSA, admitted for transient episode of right hand weakness, difficulty writing and right hand numbness.  Resolved in 45 minutes.  CT negative for acute abnormality.  LDL 88 A1c 5.3 UDS negative. MRI no acute infarct. CTA head neck showed left ICA proximal soft and calcified plaque with 45% stenosis. However, on my reviewing of the image, about 50-55% stenosis. Carotid Doppler showed left ICA 13-08% stenosis systolic and 65-78% diastolic. EF 55-60%.     Patient symptoms most likely related to left ICA bifurcation atherosclerosis, however radiology report in less than 50% stenosis, vascular surgery so far does not recommend CEA at this time.  Will recommend aspirin and Plavix dual antiplatelet for 3 weeks and then Plavix alone.  Due to history of palpitation, recommend loop recorder vs. 30-day cardio event monitoring as outpatient.  Pt also need close vascular surgery follow up to monitor left ICA stenosis.  Neurology will sign off. Please call with questions. Pt will follow up with stroke clinic NP at [facility] in about 4 weeks. Thanks for the consult.  Rosalin Hawking, MD PhD Stroke Neurology 08/20/2017 4:56 PM    To contact Stroke Continuity provider, please refer to [email or web address] After hours, contact General Neurology

## 2017-08-20 NOTE — Discharge Summary (Signed)
Physician Discharge Summary  [name] KNL:[id number] DOB: 23-May-1951 DOA: 08/18/2017  PCP: Mosie Lukes, MD  Admit date: 08/18/2017 Discharge date: 08/20/2017  Admitted From:home Disposition:home  Recommendations for Outpatient Follow-up:  1. Follow up with PCP in 1-2 weeks 2. Follow-up with a neurologist as outpatient.  Home Health: None Equipment/Devices: None Discharge Condition: Stable full code CODE STATUS: Full code Diet recommendation: Heart healthy  Brief/Interim Summary: 67 year old female with history of hypertension, hyperlipidemia, palpitation presented to the ER with sudden onset of right hand numbness.  Admitted for TIA.  #Right hand numbness likely TIA: Symptoms lasted for about 45 minutes.  CT head negative for acute finding.  MRI of the brain with no acute finding. CT angios neck showed 45% proximal left ICA stenosis.    Carotid Doppler ultrasound was done which was reviewed with neurologist.  Evaluated by vascular surgery recommended medical management and no surgical intervention.  I reviewed this with the patient and she verbalized understanding.  Plan to continue aspirin and Plavix for 3 weeks and then aspirin alone.  Also on a statin. -Echocardiogram with EF 55-60%, no source of emboli. -LDL 88, A1c 5.3 -Patient symptoms improved.  She understand outpatient follow-up. -Cardiology contacted to arrange for 30 days event monitor.  Patient will be contacted.  #Essential hypertension: Resume home medication.  Monitor blood pressure.  #Hyperlipidemia: LDL 88: Goal less than 70.  On pravastatin 40 mg.     Discharge Diagnoses:  Principal Problem:   Transient neurologic deficit Active Problems:   Hyperlipidemia   Essential hypertension, benign   Palpitations    Discharge Instructions  Discharge Instructions    Ambulatory referral to Neurology   Complete by:  As directed    Follow up with stroke clinic NP (Jessica Vanschaick or Cecille Rubin, if  both not available, consider Dr. Antony Contras, Dr. Bess Harvest, or Dr. Sarina Ill) at [facility] in about 4 weeks.   Call MD for:  difficulty breathing, headache or visual disturbances   Complete by:  As directed    Call MD for:  extreme fatigue   Complete by:  As directed    Call MD for:  hives   Complete by:  As directed    Call MD for:  persistant dizziness or light-headedness   Complete by:  As directed    Call MD for:  persistant nausea and vomiting   Complete by:  As directed    Call MD for:  severe uncontrolled pain   Complete by:  As directed    Call MD for:  temperature >100.4   Complete by:  As directed    Diet - low sodium heart healthy   Complete by:  As directed    Discharge instructions   Complete by:  As directed    Please take aspirin and plavix both for 3 weeks and then aspirin only indefinitely   Increase activity slowly   Complete by:  As directed      Allergies as of 08/20/2017   No Known Allergies     Medication List    TAKE these medications   aspirin 81 MG tablet Take 81 mg by mouth daily.   calcium carbonate 500 MG chewable tablet Commonly known as:  TUMS - dosed in mg elemental calcium Chew 1 tablet by mouth daily.   clopidogrel 75 MG tablet Commonly known as:  PLAVIX Take 1 tablet (75 mg total) by mouth daily for 21 days.   fish oil-omega-3 fatty acids 1000  MG capsule Take 2 g by mouth daily.   hydrochlorothiazide 12.5 MG capsule Commonly known as:  MICROZIDE Take 1 capsule (12.5 mg total) by mouth daily.   ibuprofen 200 MG tablet Commonly known as:  ADVIL,MOTRIN Take 200 mg by mouth every 6 (six) hours as needed for pain.   lovastatin 20 MG tablet Commonly known as:  MEVACOR Take 2 tablets (40 mg total) by mouth at bedtime.   metoprolol tartrate 50 MG tablet Commonly known as:  LOPRESSOR Take 1 tablet (50 mg total) by mouth 2 (two) times daily.   oxymetazoline 0.05 % nasal spray Commonly known as:   AFRIN Place 2 sprays into both nostrils as needed for congestion.   tretinoin 0.1 % cream Commonly known as:  RETIN-A APPLY A PEA-SIZED AMOUNT TOPICALLY TO FACE AT BEDTIME      Follow-up Information    Mosie Lukes, MD. Schedule an appointment as soon as possible for a visit in 1 week(s).   Specialty:  Family Medicine Contact information: [place] [address] [phone]        [facility] Follow up today.   Specialty:  Neurology Why:  follow up appt in 4-6 weeks, office will call with appt you may follow up with neurologist in [place] if you desire. Contact information: [place] [phone]         No Known Allergies  Consultations: Neurology Vascular surgery  Procedures/Studies: MRI, CT scan, carotid Doppler, echo  Subjective: Seen and examined at bedside.  No new event.  Denies headache, dizziness, nausea, vomiting, weakness or numbness.  Discharge Exam: Vitals:   08/20/17 0400 08/20/17 0833  BP: (!) 156/82 (!) 147/79  Pulse: 72 80  Resp: 20 20  Temp: 97.6 F (36.4 C) 97.7 F (36.5 C)  SpO2: 97% 97%   Vitals:   08/19/17 2000 08/20/17 0000 08/20/17 0400 08/20/17 0833  BP: (!) 175/70 (!) 153/68 (!) 156/82 (!) 147/79  Pulse: 81 62 72 80  Resp: 18 18 20 20   Temp: 97.8 F (36.6 C) 97.7 F (36.5 C) 97.6 F (36.4 C) 97.7 F (36.5 C)  TempSrc: Oral Oral Oral Oral  SpO2: 99% 96% 97% 97%  Weight:      Height:        General: Pt is alert, awake, not in acute distress Cardiovascular: RRR, S1/S2 +, no rubs, no gallops Respiratory: CTA bilaterally, no wheezing, no rhonchi Abdominal: Soft, NT, ND, bowel sounds + Extremities: no edema, no cyanosis    The results of significant diagnostics from this hospitalization (including imaging, microbiology, ancillary and laboratory) are listed below for reference.     Microbiology: No results  found for this or any previous visit (from the past 240 hour(s)).   Labs: BNP (last 3 results) No results for input(s): BNP in the last 8760 hours. Basic Metabolic Panel: Recent Labs  Lab 08/18/17 1352  NA 137  K 3.6  CL 101  CO2 26  GLUCOSE 107*  BUN 30*  CREATININE 1.06*  CALCIUM 9.5   Liver Function Tests: Recent Labs  Lab 08/18/17 1352  AST 24  ALT 23  ALKPHOS 46  BILITOT 0.6  PROT 7.2  ALBUMIN 4.3   No results for input(s): LIPASE, AMYLASE in the last 168 hours. No results for input(s): AMMONIA in the last 168 hours. CBC: Recent Labs  Lab 08/18/17 1352  WBC 6.4  NEUTROABS 3.2  HGB 13.2  HCT 39.3  MCV 92.3  PLT 213   Cardiac Enzymes: Recent Labs  Lab 08/18/17 1352  TROPONINI <0.03   BNP: Invalid input(s): POCBNP CBG: No results for input(s): GLUCAP in the last 168 hours. D-Dimer No results for input(s): DDIMER in the last 72 hours. Hgb A1c Recent Labs    08/19/17 0403  HGBA1C 5.3   Lipid Profile Recent Labs    08/19/17 0403  CHOL 153  HDL 43  LDLCALC 88  TRIG 110  CHOLHDL 3.6   Thyroid function studies No results for input(s): TSH, T4TOTAL, T3FREE, THYROIDAB in the last 72 hours.  Invalid input(s): FREET3 Anemia work up No results for input(s): VITAMINB12, FOLATE, FERRITIN, TIBC, IRON, RETICCTPCT in the last 72 hours. Urinalysis    Component Value Date/Time   COLORURINE YELLOW 08/18/2017 1200   APPEARANCEUR CLEAR 08/18/2017 1200   LABSPEC 1.015 08/18/2017 1200   PHURINE 6.5 08/18/2017 1200   GLUCOSEU NEGATIVE 08/18/2017 1200   HGBUR NEGATIVE 08/18/2017 1200   BILIRUBINUR NEGATIVE 08/18/2017 1200   KETONESUR NEGATIVE 08/18/2017 1200   PROTEINUR NEGATIVE 08/18/2017 1200   UROBILINOGEN 0.2 01/22/2014 1017   NITRITE NEGATIVE 08/18/2017 1200   LEUKOCYTESUR NEGATIVE 08/18/2017 1200   Sepsis Labs Invalid input(s): PROCALCITONIN,  WBC,  LACTICIDVEN Microbiology No results found for this or any previous visit (from the past 240  hour(s)).   Time coordinating discharge: 25 minutes  SIGNED:   Rosita Fire, MD  [facility] 08/20/2017, 10:57 AM  If 7PM-7AM, please contact night-coverage [email or web address] Password [email or web address]

## 2017-08-20 NOTE — Care Management Note (Signed)
Case Management Note  Patient Details  Name: [name] MRN: [id number] Date of Birth: 05/08/1951  Subjective/Objective:                    Action/Plan: Pt discharging home with self care. Pt has PCP, insurance and transportation home. No further needs per CM.   Expected Discharge Date:  08/20/17               Expected Discharge Plan:  Home/Self Care  In-House Referral:     Discharge planning Services     Post Acute Care Choice:    Choice offered to:     DME Arranged:    DME Agency:     HH Arranged:    HH Agency:     Status of Service:  Completed, signed off  If discussed at [employer] of Stay Meetings, dates discussed:    Additional Comments:  Pollie Friar, RN 08/20/2017, 11:09 AM

## 2017-08-20 NOTE — Progress Notes (Signed)
Carotid artery duplex has been completed. 1-39% ICA stenosis on the right. 40-59% and 60-79% ICA stenosis on the left.  08/20/17 9:05 AM [name] RVT

## 2017-08-20 NOTE — Progress Notes (Signed)
Patients IV infiltrated, infusion stopped, catheter removed and (R)arm elevated. IV D/C for for chart.

## 2017-08-24 ENCOUNTER — Other Ambulatory Visit: Payer: Self-pay | Admitting: Cardiology

## 2017-08-24 ENCOUNTER — Telehealth (HOSPITAL_COMMUNITY): Payer: Self-pay | Admitting: Vascular Surgery

## 2017-08-24 ENCOUNTER — Other Ambulatory Visit: Payer: Self-pay

## 2017-08-24 DIAGNOSIS — I4891 Unspecified atrial fibrillation: Secondary | ICD-10-CM

## 2017-08-24 DIAGNOSIS — I6522 Occlusion and stenosis of left carotid artery: Secondary | ICD-10-CM

## 2017-08-24 DIAGNOSIS — R002 Palpitations: Secondary | ICD-10-CM

## 2017-08-24 DIAGNOSIS — G459 Transient cerebral ischemic attack, unspecified: Secondary | ICD-10-CM

## 2017-08-24 NOTE — Telephone Encounter (Signed)
-----   Message from Conrad [place], MD sent at 08/23/2017  3:21 PM EDT ----- Regarding: RE: Vascular consult or ultrasound Disease <50% so annual surveillance  ----- Message ----- From: Rufina Falco Sent: 08/23/2017   1:24 PM To: Conrad [place], MD Subject: Vascular consult or ultrasound                 We have a referral from Dr. Erlinda Hong for patient to follow up with you for left carotid stenosis.    Does she need an appt for your next available or a follow up ultrasound in 6 months with visit with you?   When she spoke with you she understood that treatment wasn't needed at this time so she was surprised she is being referred by Dr. Renae Gloss [facility]

## 2017-09-06 ENCOUNTER — Encounter: Payer: Self-pay | Admitting: Family Medicine

## 2017-09-06 MED ORDER — FLUTICASONE PROPIONATE 50 MCG/ACT NA SUSP: 2.0000 | Freq: Every day | NASAL | 5 refills | Status: DC | PRN

## 2017-09-08 ENCOUNTER — Ambulatory Visit: Payer: Medicare HMO

## 2017-09-08 ENCOUNTER — Other Ambulatory Visit: Payer: Self-pay | Admitting: Family Medicine

## 2017-09-08 DIAGNOSIS — I4891 Unspecified atrial fibrillation: Secondary | ICD-10-CM | POA: Diagnosis not present

## 2017-09-08 DIAGNOSIS — R002 Palpitations: Secondary | ICD-10-CM

## 2017-09-08 DIAGNOSIS — Z1231 Encounter for screening mammogram for malignant neoplasm of breast: Secondary | ICD-10-CM

## 2017-09-08 DIAGNOSIS — G459 Transient cerebral ischemic attack, unspecified: Secondary | ICD-10-CM

## 2017-09-29 ENCOUNTER — Telehealth: Payer: Self-pay

## 2017-09-29 ENCOUNTER — Encounter: Payer: Self-pay | Admitting: *Deleted

## 2017-09-29 ENCOUNTER — Telehealth: Payer: Self-pay | Admitting: *Deleted

## 2017-09-29 NOTE — Telephone Encounter (Signed)
error 

## 2017-09-29 NOTE — Telephone Encounter (Signed)
Late entry from 09/28/17 at 1647: Strips received showing SVT.  Called patient. She felt "a little dizziness" during the episode but it subsided after about 10 seconds when the rhythm "broke." She was making her bed at the time. She has been asymptomatic ever since. Reviewed with Dr. Curt Bears, DOD, who recommends the patient establish with EP.    Today at 0929:  Called patient and informed her she will be called by EP scheduler to arrange NP OV.  She requests visit arranged in [place] if possible. She was grateful for assistance.

## 2017-09-29 NOTE — Telephone Encounter (Signed)
Pulled an abnormal monitor report from [employer] site and reached out to Dr. Jackalyn Lombard office to verify patient was notified and taken care off. Pt was phoned yesterday and is being scheduled with a cardiologist as soon as possible per Michalene in Triage at [facility].

## 2017-09-30 NOTE — Telephone Encounter (Signed)
Patient is scheduled 5/22 with Dr. Curt Bears.

## 2017-10-06 ENCOUNTER — Encounter: Payer: Self-pay | Admitting: Cardiology

## 2017-10-11 ENCOUNTER — Encounter (HOSPITAL_COMMUNITY): Payer: Medicare HMO

## 2017-10-11 ENCOUNTER — Ambulatory Visit (HOSPITAL_BASED_OUTPATIENT_CLINIC_OR_DEPARTMENT_OTHER): Payer: Medicare HMO

## 2017-10-11 ENCOUNTER — Encounter: Payer: Medicare HMO | Admitting: Surgery

## 2017-10-19 ENCOUNTER — Ambulatory Visit (HOSPITAL_BASED_OUTPATIENT_CLINIC_OR_DEPARTMENT_OTHER)
Admission: RE | Admit: 2017-10-19 | Discharge: 2017-10-19 | Disposition: A | Discharge status: Home / Self Care | Payer: Medicare HMO | Source: Ambulatory Visit | Attending: Family Medicine | Admitting: Family Medicine

## 2017-10-19 DIAGNOSIS — Z1231 Encounter for screening mammogram for malignant neoplasm of breast: Secondary | ICD-10-CM

## 2017-10-22 ENCOUNTER — Encounter: Payer: Self-pay | Admitting: Family Medicine

## 2017-10-22 ENCOUNTER — Other Ambulatory Visit: Payer: Self-pay | Admitting: Family Medicine

## 2017-10-22 ENCOUNTER — Telehealth: Payer: Self-pay | Admitting: Family Medicine

## 2017-10-22 DIAGNOSIS — R059 Cough, unspecified: Secondary | ICD-10-CM

## 2017-10-22 DIAGNOSIS — R05 Cough: Secondary | ICD-10-CM

## 2017-10-22 MED ORDER — HYDROCODONE-HOMATROPINE 5-1.5 MG/5ML PO SYRP
5.0000 mL | ORAL_SOLUTION | Freq: Three times a day (TID) | ORAL | 0 refills | Status: DC | PRN
Start: 2017-10-22 — End: 2017-12-27

## 2017-10-22 NOTE — Telephone Encounter (Signed)
Copied from [place] [id number]. Topic: Quick Communication - See Telephone Encounter >> Oct 22, 2017  4:24 PM Neva Seat wrote: Coughing severely - and sore throat - cannot sleep for 1 week.  Pt would like for something to be called into her pharmacy and Dr. Frederik Pear nurse to give her a call.

## 2017-10-25 NOTE — Telephone Encounter (Signed)
Please advise  Just now seeing this. Called patient states he still does not feel good

## 2017-10-25 NOTE — Telephone Encounter (Signed)
OK to call in Augmentin 875 mg 1 tab po bid x 7 days. Take mucinex bid with it and can hae tessalon perles 100 mg caps 1-2 po tid prn cough. Disp #30 if no improvement needs to come in for evaluation

## 2017-10-25 NOTE — Telephone Encounter (Signed)
I sent the Hydromet to her pharmacy on 5/17. So she should not need any more. She might need to be seen if this is not helping. Need to know more about how long feeling ill. What symptoms and what she has tried before I can give more advice.

## 2017-10-25 NOTE — Telephone Encounter (Signed)
Pt states she had to stop taking the medication Sunday because it makes her feel weird doesn't like the feeling pt states she has been feeling like this for 10 days and cough is persistint also she is coughing up mucus as well pt states she is very fatigue, and also has a headache on and off.

## 2017-10-26 MED ORDER — AMOXICILLIN-POT CLAVULANATE 875-125 MG PO TABS: 1.0000 | ORAL_TABLET | Freq: Two times a day (BID) | ORAL | 0 refills | Status: DC

## 2017-10-26 MED ORDER — BENZONATATE 100 MG PO CAPS: 100.0000 mg | ORAL_CAPSULE | Freq: Three times a day (TID) | ORAL | 0 refills | Status: DC | PRN

## 2017-10-26 NOTE — Telephone Encounter (Signed)
Spoke with pt and Rx has been sent. °

## 2017-10-27 ENCOUNTER — Institutional Professional Consult (permissible substitution): Payer: Medicare HMO | Admitting: Cardiology

## 2017-10-27 NOTE — Progress Notes (Unsigned)
Sinus rhythm 48 second episode of SVT is noted on 09/27/17 at 7:36 am with symptoms of "dizziness".  Cannot exclude atrial flutter with this episode.  The episode has been reviewed by Dr Curt Bears and follow-up with Dr Curt Bears has been scheduled to address further. No afib No pauses or AV block episodes Good compliance with wearing the monitor

## 2017-10-28 ENCOUNTER — Telehealth: Payer: Self-pay

## 2017-10-28 NOTE — Telephone Encounter (Signed)
-----   Message from Rosalin Hawking, MD sent at 10/27/2017 11:29 PM EDT ----- Could you please let the patient know that the heart monitoring test done recently showed one episode of fast heart beating. Our cardiologist Dr. Curt Bears will call her for appointment to discuss further. Thanks.  Rosalin Hawking, MD PhD Stroke Neurology 10/27/2017 11:24 PM

## 2017-10-28 NOTE — Telephone Encounter (Signed)
Notes recorded by Marval Regal, RN on 10/28/2017 at 10:24 AM EDT RN call patient about her cardiac monitor. Rn stated it showed one episode of fast heart beating. Pt stated she has an appointment with the cardiologist in June 2019 for this episode. PT verbalized understanding. ------

## 2017-11-10 ENCOUNTER — Ambulatory Visit: Payer: Medicare HMO | Admitting: Cardiology

## 2017-11-10 ENCOUNTER — Encounter: Payer: Self-pay | Admitting: Cardiology

## 2017-11-10 VITALS — BP 126/84 | HR 77 | Ht 59.0 in | Wt 176.0 lb

## 2017-11-10 DIAGNOSIS — I10 Essential (primary) hypertension: Secondary | ICD-10-CM

## 2017-11-10 DIAGNOSIS — G459 Transient cerebral ischemic attack, unspecified: Secondary | ICD-10-CM | POA: Diagnosis not present

## 2017-11-10 DIAGNOSIS — I471 Supraventricular tachycardia: Secondary | ICD-10-CM

## 2017-11-10 NOTE — Patient Instructions (Addendum)
Medication Instructions:  Your physician recommends that you continue on your current medications as directed. Please refer to the Current Medication list given to you today.  Labwork: None ordered  Testing/Procedures: Your physician has recommended that you have a Loop recorder implanted. Instructions for your Loop Recorder: 1. Please arrive at the [facility], Main Entrance "A", of [facility] at 10:00 a.m. on 11/22/2017. 2. You may have a light breakfast the morning of this procedure. 3. You make take your morning medications as you normally would.  Follow-Up: Your physician recommends that you schedule a follow-up appointment in: 7-10 days, after your procedure on 11/22/17, with device clinic for a wound check.   * If you need a refill on your cardiac medications before your next appointment, please call your pharmacy.   *Please note that any paperwork needing to be filled out by the provider will need to be addressed at the front desk prior to seeing the provider. Please note that any FMLA, disability or other documents regarding health condition is subject to a $25.00 charge that must be received prior to completion of paperwork in the form of a money order or check.  Thank you for choosing [facility]!!   Trinidad Curet, RN [phone]  Any Other Special Instructions Will Be Listed Below (If Applicable).  Implantable Loop Recorder Placement An implantable loop recorder is a small electronic device that is placed under the skin of your chest. It is about the size of an AA ("double A") battery. The device records the electrical activity of your heart over a long period of time. Your health care provider can download these recordings to monitor your heart. You may need an implantable loop recorder if you have periods of abnormal heart activity (arrhythmias) or unexplained fainting (syncope) caused by a heart problem. Tell a health care provider about:  Any allergies  you have.  All medicines you are taking, including vitamins, herbs, eye drops, creams, and over-the-counter medicines.  Any problems you or family members have had with anesthetic medicines.  Any blood disorders you have.  Any surgeries you have had.  Any medical conditions you have.  Whether you are pregnant or may be pregnant. What are the risks? Generally, this is a safe procedure. However, as with any procedure, problems may occur, including:  Infection.  Bleeding.  Allergic reactions to anesthetic medicines.  Damage to nerves or blood vessels.  Failure of the device to work. This could require another surgery to replace it.  What happens before the procedure?   You may have a physical exam, blood tests, and imaging tests of your heart, such as a chest X-ray.  Follow instructions from your health care provider about eating or drinking restrictions.  Ask your health care provider about: ? Changing or stopping your regular medicines. This is especially important if you are taking diabetes medicines or blood thinners. ? Taking medicines such as aspirin and ibuprofen. These medicines can thin your blood. Do not take these medicines before your procedure if your surgeon instructs you not to.  Ask your health care provider how your surgical site will be marked or identified.  You may be given antibiotic medicine to help prevent infection.  Plan to have someone take you home after the procedure.  If you will be going home right after the procedure, plan to have someone with you for 24 hours.  Do not use any tobacco products, such as cigarettes, chewing tobacco, and e-cigarettes as told by your  Psychologist, sport and exercise. If you need help quitting, ask your health care provider. What happens during the procedure?  To reduce your risk of infection: ? Your health care team will wash or sanitize their hands. ? Your skin will be washed with soap.  An IV tube will be inserted into one of your  veins.  You may be given an antibiotic medicine through the IV tube.  You may be given one or more of the following: ? A medicine to help you relax (sedative). ? A medicine to numb the area (local anesthetic).  A small cut (incision) will be made on the left side of your upper chest.  A pocket will be created under your skin.  The device will be placed in the pocket.  The incision will be closed with stitches (sutures) or adhesive strips.  A bandage (dressing) will be placed over the incision. The procedure may vary among health care providers and hospitals. What happens after the procedure?  Your blood pressure, heart rate, breathing rate, and blood oxygen level will be monitored often until the medicines you were given have worn off.  You may be able to go home on the day of your surgery. Before going home: ? Your health care provider will program your recorder. ? You will learn how to trigger your device with a handheld activator. ? You will learn how to send recordings to your health care provider. ? You will get an ID card for your device, and you will be told when to use it.  Do not drive for 24 hours if you received a sedative. This information is not intended to replace advice given to you by your health care provider. Make sure you discuss any questions you have with your health care provider. Document Released: 05/06/2015 Document Revised: 10/31/2015 Document Reviewed: 02/27/2015 Elsevier Interactive Patient Education  [employer].

## 2017-11-10 NOTE — Progress Notes (Signed)
Electrophysiology Office Note   Date:  11/10/2017   ID:  [name], DOB 26-Sep-1950, MRN [id number]  PCP:  Mosie Lukes, MD  Cardiologist:   Primary Electrophysiologist:  Rachel Rabine Meredith Leeds, MD    Chief Complaint  Patient presents with  . Advice Only    SVT     History of Present Illness: [name] is a 67 y.o. female who is being seen today for the evaluation of TIA at the request of Rosalin Hawking. Presenting today for electrophysiology evaluation.  She has a history of hypertension, hyperlipidemia who was admitted to the hospital with sudden onset of hand numbness March 2019.  She was diagnosed with a TIA.  CT and MRI of the head was negative for acute findings.  She did have a 45% proximal left ICA stenosis.  Vascular surgery recommended medical management..  Today for follow-up of her 30-day monitor.  The monitor showed episodes of a narrow complex tachycardia.  She says that she has had palpitations for the last few years.  They have occurred a few times a year and have lasted only a few seconds at a time.  At this point, she is comfortable with her therapy of metoprolol.  There is no exacerbating or alleviating factor.    Today, she denies symptoms of  chest pain, shortness of breath, orthopnea, PND, lower extremity edema, claudication, dizziness, presyncope, syncope, bleeding, or neurologic sequela. The patient is tolerating medications without difficulties.    Past Medical History:  Diagnosis Date  . Arthritis   . Benign paroxysmal positional vertigo 11/14/2013  . Depression   . Essential hypertension, benign   . GERD (gastroesophageal reflux disease) 09/02/2013  . Glaucoma   . Glucose intolerance (impaired glucose tolerance)   . H/O tobacco use, presenting hazards to health 09/02/2013  . History  of basal cell carcinoma   . History of cardiac arrhythmia   . Hyperlipidemia   . Medicare welcome visit 11/28/2012  . Obesity 02/08/2017  . Palpitations 02/04/2017  .  Personal history of colonic polyps 09/02/2013   Patient reports H/O 2 colonoscopies, last one dates back to 2012 in [place] She reports they were labeled as incomplete.  Reports 1st colonoscopy had 1 benign polyp and the 2nd was clear.   . Sleep apnea 10/01/2016   Past Surgical History:  Procedure Laterality Date  . CERVICAL CONE BIOPSY  1960s  . DILATION AND CURETTAGE OF UTERUS  1966  . KNEE SURGERY Left 11/01/2008   left knee total replacement Dr Sharol Given     Current Outpatient Medications  Medication Sig Dispense Refill  . aspirin 81 MG tablet Take 81 mg by mouth daily.      . fish oil-omega-3 fatty acids 1000 MG capsule Take 2 g by mouth daily.     . fluticasone (FLONASE) 50 MCG/ACT nasal spray Place 2 sprays into both nostrils daily as needed for allergies or rhinitis. 16 g 5  . hydrochlorothiazide (MICROZIDE) 12.5 MG capsule Take 1 capsule (12.5 mg total) by mouth daily. 90 capsule 3  . HYDROcodone-homatropine (HYCODAN) 5-1.5 MG/5ML syrup Take 5 mLs by mouth every 8 (eight) hours as needed for cough. 120 mL 0  . ibuprofen (ADVIL,MOTRIN) 200 MG tablet Take 200 mg by mouth every 6 (six) hours as needed for pain.    Marland Kitchen lovastatin (MEVACOR) 20 MG tablet Take 2 tablets (40 mg total) by mouth at bedtime. 180 tablet 3  . metoprolol tartrate (LOPRESSOR) 50 MG tablet Take 1 tablet (  50 mg total) by mouth 2 (two) times daily. 180 tablet 3  . tretinoin (RETIN-A) 0.1 % cream APPLY A PEA-SIZED AMOUNT TOPICALLY TO FACE AT BEDTIME 45 g 5   No current facility-administered medications for this visit.     Allergies:   Patient has no known allergies.   Social History:  The patient  reports that she quit smoking about 35 years ago. Her smoking use included cigarettes. She has a 15.00 pack-year smoking history. She has never used smokeless tobacco. She reports that she drinks alcohol. She reports that she does not use drugs.   Family History:  The patient's family history includes Birth defects in her father and  mother; COPD in her mother; Cancer in her brother; Coronary artery disease in her paternal grandmother; Dementia in her paternal grandfather; Heart disease in her brother; Hyperlipidemia in her paternal grandmother and unknown relative; Hypertension in her father, maternal grandfather, maternal grandmother, mother, paternal grandfather, and paternal grandmother; Kidney disease in her father; Lung cancer in her mother; Stroke in her paternal grandmother; Uterine cancer in her mother.    ROS:  Please see the history of present illness.   Otherwise, review of systems is positive for palpitations.   All other systems are reviewed and negative.    PHYSICAL EXAM: VS:  BP 126/84   Pulse 77   Ht 4\' 11"  (1.499 m)   Wt 176 lb (79.8 kg)   SpO2 95%   BMI 35.55 kg/m  , BMI Body mass index is 35.55 kg/m. GEN: Well nourished, well developed, in no acute distress  HEENT: normal  Neck: no JVD, carotid bruits, or masses Cardiac: RRR; no murmurs, rubs, or gallops,no edema  Respiratory:  clear to auscultation bilaterally, normal work of breathing GI: soft, nontender, nondistended, + BS MS: no deformity or atrophy  Skin: warm and dry Neuro:  Strength and sensation are intact Psych: euthymic mood, full affect  EKG:  EKG is not ordered today. Personal review of the ekg ordered 08/19/17 shows sinus rhythm, rate 62  ZRecent Labs: 05/14/2017: TSH 2.63 08/18/2017: ALT 23; BUN 30; Creatinine, Ser 1.06; Hemoglobin 13.2; Platelets 213; Potassium 3.6; Sodium 137    Lipid Panel     Component Value Date/Time   CHOL 153 08/19/2017 0403   TRIG 110 08/19/2017 0403   HDL 43 08/19/2017 0403   CHOLHDL 3.6 08/19/2017 0403   VLDL 22 08/19/2017 0403   LDLCALC 88 08/19/2017 0403   LDLDIRECT 131.8 10/10/2008 1206     Wt Readings from Last 3 Encounters:  11/10/17 176 lb (79.8 kg)  08/18/17 181 lb (82.1 kg)  08/18/17 181 lb 9.6 oz (82.4 kg)      Other studies Reviewed: Additional studies/ records that were  reviewed today include: TTE 08/19/17  Review of the above records today demonstrates:  - Left ventricle: The cavity size was normal. Systolic function was   normal. The estimated ejection fraction was in the range of 55%   to 60%. Wall motion was normal; there were no regional wall   motion abnormalities. Doppler parameters are consistent with   abnormal left ventricular relaxation (grade 1 diastolic   dysfunction). - Mitral valve: Mildly calcified annulus.  30 day monitor 10/27/17 - personally reviewed Sinus rhythm 48 second episode of SVT is noted on 09/27/17 at 7:36 am with symptoms of "dizziness".  Cannot exclude atrial flutter with this episode.  The episode has been reviewed by Dr Curt Bears and follow-up with Dr Curt Bears has been scheduled to address  further. No afib No pauses or AV block episodes Good compliance with wearing the monitor  ASSESSMENT AND PLAN:  1.  SVT: Currently she has symptoms once or twice a year the last up to a minute at a time.  She is comfortable with her current therapy on metoprolol.  She does not wish to have this further treated.  No changes.  2.  TIA: With symptoms of arm weakness and cramping.  She has had this multiple times.  I discussed with her the possibility of link implant for further monitoring for atrial fibrillation.  At this point she feels that this is a reasonable thing to do.  We Charlii Yost plan to implant a Linq monitor at her next availability.  3.  Hypertension: Currently well controlled.  Current medicines are reviewed at length with the patient today.   The patient does not have concerns regarding her medicines.  The following changes were made today:  none  Labs/ tests ordered today include:  No orders of the defined types were placed in this encounter.    Disposition:   FU with [name] 6 months  Signed, Rachel Mckay Meredith Leeds, MD  11/10/2017 3:55 PM     [facility] [address] [place]  [address] [phone] (office) [phone] (fax)

## 2017-11-16 ENCOUNTER — Encounter: Payer: Self-pay | Admitting: Cardiology

## 2017-11-18 NOTE — Telephone Encounter (Signed)
Informed patient that LINQ implant cannot be done in the office at this time. Procedure cancelled, per patient request, d/t already outstanding balance at the hospital.  She cannot afford to incur anymore hospital debt at this time. She will call the office back if things change and/or she would like to reschedule. Will inform Dr. Curt Bears.

## 2017-11-22 ENCOUNTER — Encounter (HOSPITAL_COMMUNITY): Admission: RE | Payer: Self-pay | Source: Ambulatory Visit

## 2017-11-22 ENCOUNTER — Ambulatory Visit (HOSPITAL_COMMUNITY): Admission: RE | Admit: 2017-11-22 | Payer: Medicare HMO | Source: Ambulatory Visit | Admitting: Cardiology

## 2017-11-22 SURGERY — LOOP RECORDER INSERTION

## 2017-12-01 ENCOUNTER — Ambulatory Visit: Payer: Medicare HMO

## 2017-12-27 ENCOUNTER — Ambulatory Visit (INDEPENDENT_AMBULATORY_CARE_PROVIDER_SITE_OTHER): Payer: Medicare HMO | Admitting: Family Medicine

## 2017-12-27 ENCOUNTER — Ambulatory Visit (HOSPITAL_BASED_OUTPATIENT_CLINIC_OR_DEPARTMENT_OTHER)
Admission: RE | Admit: 2017-12-27 | Discharge: 2017-12-27 | Disposition: A | Discharge status: Home / Self Care | Payer: Medicare HMO | Source: Ambulatory Visit | Attending: Family Medicine | Admitting: Family Medicine

## 2017-12-27 VITALS — BP 122/72 | HR 78 | Temp 97.8°F | Resp 18 | Wt 179.8 lb

## 2017-12-27 DIAGNOSIS — M47814 Spondylosis without myelopathy or radiculopathy, thoracic region: Secondary | ICD-10-CM | POA: Insufficient documentation

## 2017-12-27 DIAGNOSIS — M40209 Unspecified kyphosis, site unspecified: Secondary | ICD-10-CM | POA: Diagnosis not present

## 2017-12-27 DIAGNOSIS — Q688 Other specified congenital musculoskeletal deformities: Secondary | ICD-10-CM | POA: Diagnosis not present

## 2017-12-27 DIAGNOSIS — M19011 Primary osteoarthritis, right shoulder: Secondary | ICD-10-CM | POA: Diagnosis not present

## 2017-12-27 DIAGNOSIS — G8929 Other chronic pain: Secondary | ICD-10-CM

## 2017-12-27 DIAGNOSIS — M47812 Spondylosis without myelopathy or radiculopathy, cervical region: Secondary | ICD-10-CM | POA: Diagnosis not present

## 2017-12-27 DIAGNOSIS — Q740 Other congenital malformations of upper limb(s), including shoulder girdle: Secondary | ICD-10-CM

## 2017-12-27 DIAGNOSIS — I10 Essential (primary) hypertension: Secondary | ICD-10-CM

## 2017-12-27 DIAGNOSIS — M546 Pain in thoracic spine: Secondary | ICD-10-CM | POA: Insufficient documentation

## 2017-12-27 NOTE — Patient Instructions (Signed)

## 2018-01-02 DIAGNOSIS — Q740 Other congenital malformations of upper limb(s), including shoulder girdle: Secondary | ICD-10-CM | POA: Insufficient documentation

## 2018-01-02 DIAGNOSIS — M546 Pain in thoracic spine: Principal | ICD-10-CM

## 2018-01-02 DIAGNOSIS — Q688 Other specified congenital musculoskeletal deformities: Secondary | ICD-10-CM

## 2018-01-02 DIAGNOSIS — G8929 Other chronic pain: Secondary | ICD-10-CM | POA: Insufficient documentation

## 2018-01-02 NOTE — Progress Notes (Signed)
Subjective:    Patient ID: [name], female    DOB: 02/11/1951, 67 y.o.   MRN: [id number]  No chief complaint on file.   HPI Patient is in today for evaluation of a clavicle abnormality. No acute injury and not tender to palpation but she noticed a prominence in her right clavicle about 9 days ago. She does note some intermittent discomfort in thoracic and cervical spine at times. Denies CP/palp/SOB/HA/congestion/fevers/GI or GU c/o. Taking meds as prescribed  Past Medical History:  Diagnosis Date  . Arthritis   . Benign paroxysmal positional vertigo 11/14/2013  . Depression   . Essential hypertension, benign   . GERD (gastroesophageal reflux disease) 09/02/2013  . Glaucoma   . Glucose intolerance (impaired glucose tolerance)   . H/O tobacco use, presenting hazards to health 09/02/2013  . History  of basal cell carcinoma   . History of cardiac arrhythmia   . Hyperlipidemia   . Medicare welcome visit 11/28/2012  . Obesity 02/08/2017  . Palpitations 02/04/2017  . Personal history of colonic polyps 09/02/2013   Patient reports H/O 2 colonoscopies, last one dates back to 2012 in [place] She reports they were labeled as incomplete.  Reports 1st colonoscopy had 1 benign polyp and the 2nd was clear.   . Sleep apnea 10/01/2016    Past Surgical History:  Procedure Laterality Date  . CERVICAL CONE BIOPSY  1960s  . DILATION AND CURETTAGE OF UTERUS  1966  . KNEE SURGERY Left 11/01/2008   left knee total replacement Dr Sharol Given    Family History  Problem Relation Age of Onset  . Coronary artery disease Paternal Grandmother   . Hyperlipidemia Paternal Grandmother   . Hypertension Paternal Grandmother   . Stroke Paternal Grandmother   . Hypertension Mother   . Lung cancer Mother        smoker  . Uterine cancer Mother   . Birth defects Mother        lung cancer in smoker  . COPD Mother   . Hypertension Father   . Kidney disease Father        Renal Cell Carcinoma  . Birth defects Father        renal cell CA, smoker  . Hypertension Maternal Grandmother   . Hypertension Maternal Grandfather   . Hypertension Paternal Grandfather   . Dementia Paternal Grandfather   . Cancer Brother        testicular  . Heart disease Brother        NI, smoker  . Hyperlipidemia Unknown   . Colon cancer Neg Hx     Social History   Socioeconomic History  . Marital status: Divorced    Spouse name: Not on file  . Number of children: Not on file  . Years of education: Not on file  . Highest education level: Not on file  Occupational History  . Not on file  Social Needs  . Financial resource strain: Not on file  . Food insecurity:    Worry: Not on file    Inability: Not on file  . Transportation needs:    Medical: Not on file    Non-medical: Not on file  Tobacco Use  . Smoking status: Former Smoker    Packs/day: 1.00    Years: 15.00    Pack years: 15.00    Types: Cigarettes    Last attempt to quit: 06/08/1982    Years since quitting: 35.5  . Smokeless tobacco: Never Used  Substance and Sexual  Activity  . Alcohol use: Yes    Comment: wine w/ dinner 'once in a while'  . Drug use: No  . Sexual activity: Never    Comment: lives alone, no dietary restrictions except no lactose,  Lifestyle  . Physical activity:    Days per week: Not on file    Minutes per session: Not on file  . Stress: Not on file  Relationships  . Social connections:    Talks on phone: Not on file    Gets together: Not on file    Attends religious service: Not on file    Active member of club or organization: Not on file    Attends meetings of clubs or organizations: Not on file    Relationship status: Not on file  . Intimate partner violence:    Fear of current or ex partner: Not on file    Emotionally abused: Not on file    Physically abused: Not on file    Forced sexual activity: Not on file  Other Topics Concern  . Not on file  Social History Narrative   Occupation: unemployed Product manager-  now retired   Divorced   no children    Moved from [place] alone with 4 cats   Enjoys gardening, planting.              Outpatient Medications Prior to Visit  Medication Sig Dispense Refill  . aspirin 81 MG tablet Take 81 mg by mouth daily.      . fish oil-omega-3 fatty acids 1000 MG capsule Take 1 g by mouth daily.     . fluticasone (FLONASE) 50 MCG/ACT nasal spray Place 2 sprays into both nostrils daily as needed for allergies or rhinitis. 16 g 5  . hydrochlorothiazide (MICROZIDE) 12.5 MG capsule Take 1 capsule (12.5 mg total) by mouth daily. 90 capsule 3  . ibuprofen (ADVIL,MOTRIN) 200 MG tablet Take 400 mg by mouth every 6 (six) hours as needed for headache or moderate pain.     Marland Kitchen lovastatin (MEVACOR) 20 MG tablet Take 2 tablets (40 mg total) by mouth at bedtime. 180 tablet 3  . metoprolol tartrate (LOPRESSOR) 50 MG tablet Take 1 tablet (50 mg total) by mouth 2 (two) times daily. 180 tablet 3  . Multiple Vitamins-Minerals (MULTIVITAMIN WOMEN PO) Take 1 tablet by mouth daily.    Marland Kitchen oxymetazoline (AFRIN) 0.05 % nasal spray Place 1 spray into both nostrils 2 (two) times daily as needed for congestion.    Marland Kitchen tretinoin (RETIN-A) 0.1 % cream APPLY A PEA-SIZED AMOUNT TOPICALLY TO FACE AT BEDTIME (Patient taking differently: Apply topically to face once daily in the morning) 45 g 5  . HYDROcodone-homatropine (HYCODAN) 5-1.5 MG/5ML syrup Take 5 mLs by mouth every 8 (eight) hours as needed for cough. (Patient not taking: Reported on 11/16/2017) 120 mL 0   No facility-administered medications prior to visit.     No Known Allergies  Review of Systems  Constitutional: Negative for fever and malaise/fatigue.  HENT: Negative for congestion.   Eyes: Negative for blurred vision.  Respiratory: Negative for shortness of breath.   Cardiovascular: Negative for chest pain, palpitations and leg swelling.  Gastrointestinal: Negative for abdominal pain, blood in stool and nausea.    Genitourinary: Negative for dysuria and frequency.  Musculoskeletal: Positive for back pain and neck pain. Negative for falls.  Skin: Negative for rash.  Neurological: Negative for dizziness, loss of consciousness and headaches.  Endo/Heme/Allergies: Negative for environmental allergies.  Psychiatric/Behavioral: Negative for depression. The patient is not nervous/anxious.        Objective:    Physical Exam  Constitutional: She is oriented to person, place, and time. No distress.  HENT:  Head: Normocephalic and atraumatic.  Eyes: Conjunctivae are normal.  Neck: Neck supple. No thyromegaly present.  Cardiovascular: Normal rate, regular rhythm and normal heart sounds.  No murmur heard. Pulmonary/Chest: Effort normal and breath sounds normal. She has no wheezes.  Abdominal: She exhibits no distension and no mass.  Musculoskeletal: She exhibits no edema.  Mild scoliosis and kyphosis noted in Thoracic and cervical spine  Lymphadenopathy:    She has no cervical adenopathy.  Neurological: She is alert and oriented to person, place, and time.  Skin: Skin is warm and dry. No rash noted. She is not diaphoretic.  Psychiatric: Judgment normal.    BP 122/72 (BP Location: Left Arm, Patient Position: Sitting, Cuff Size: Normal)   Pulse 78   Temp 97.8 F (36.6 C) (Oral)   Resp 18   Wt 179 lb 12.8 oz (81.6 kg)   SpO2 94%   BMI 36.32 kg/m  Wt Readings from Last 3 Encounters:  12/27/17 179 lb 12.8 oz (81.6 kg)  11/10/17 176 lb (79.8 kg)  08/18/17 181 lb (82.1 kg)     Lab Results  Component Value Date   WBC 6.4 08/18/2017   HGB 13.2 08/18/2017   HCT 39.3 08/18/2017   PLT 213 08/18/2017   GLUCOSE 107 (H) 08/18/2017   CHOL 153 08/19/2017   TRIG 110 08/19/2017   HDL 43 08/19/2017   LDLDIRECT 131.8 10/10/2008   LDLCALC 88 08/19/2017   ALT 23 08/18/2017   AST 24 08/18/2017   NA 137 08/18/2017   K 3.6 08/18/2017   CL 101 08/18/2017   CREATININE 1.06 (H) 08/18/2017   BUN 30 (H)  08/18/2017   CO2 26 08/18/2017   TSH 2.63 05/14/2017   INR 1.01 08/18/2017   HGBA1C 5.3 08/19/2017    Lab Results  Component Value Date   TSH 2.63 05/14/2017   Lab Results  Component Value Date   WBC 6.4 08/18/2017   HGB 13.2 08/18/2017   HCT 39.3 08/18/2017   MCV 92.3 08/18/2017   PLT 213 08/18/2017   Lab Results  Component Value Date   NA 137 08/18/2017   K 3.6 08/18/2017   CO2 26 08/18/2017   GLUCOSE 107 (H) 08/18/2017   BUN 30 (H) 08/18/2017   CREATININE 1.06 (H) 08/18/2017   BILITOT 0.6 08/18/2017   ALKPHOS 46 08/18/2017   AST 24 08/18/2017   ALT 23 08/18/2017   PROT 7.2 08/18/2017   ALBUMIN 4.3 08/18/2017   CALCIUM 9.5 08/18/2017   ANIONGAP 10 08/18/2017   GFR 57.78 (L) 05/14/2017   Lab Results  Component Value Date   CHOL 153 08/19/2017   Lab Results  Component Value Date   HDL 43 08/19/2017   Lab Results  Component Value Date   LDLCALC 88 08/19/2017   Lab Results  Component Value Date   TRIG 110 08/19/2017   Lab Results  Component Value Date   CHOLHDL 3.6 08/19/2017   Lab Results  Component Value Date   HGBA1C 5.3 08/19/2017       Assessment & Plan:   Problem List Items Addressed This Visit    Essential hypertension, benign    Well controlled, no changes to meds. Encouraged heart healthy diet such as the DASH diet and exercise as tolerated.  Chronic left-sided thoracic back pain - Primary    Mild scoliosis and spondylosis noted on imaging. Encouraged moist heat and gentle stretching as tolerated. May try NSAIDs and prescription meds as directed and report if symptoms worsen or seek immediate care      Relevant Orders   DG Thoracic Spine 2 View (Completed)   Anomaly of clavicle    No acute injury and not tender to palpation but she noticed a prominence in her right clavicle about 9 days ago. Imaging does not find any concerns and is likely due to rotation caused by scoliosis will continue to monitor. She will report if she  becomes symptomatic      Relevant Orders   DG Thoracic Spine 2 View (Completed)   DG Clavicle Right (Completed)      I have discontinued Rahmah L. Orellana's HYDROcodone-homatropine. I am also having her maintain her aspirin, fish oil-omega-3 fatty acids, ibuprofen, tretinoin, lovastatin, hydrochlorothiazide, metoprolol tartrate, fluticasone, oxymetazoline, and Multiple Vitamins-Minerals (MULTIVITAMIN WOMEN PO).  No orders of the defined types were placed in this encounter.    Penni Homans, MD

## 2018-01-02 NOTE — Assessment & Plan Note (Signed)
Mild scoliosis and spondylosis noted on imaging. Encouraged moist heat and gentle stretching as tolerated. May try NSAIDs and prescription meds as directed and report if symptoms worsen or seek immediate care

## 2018-01-02 NOTE — Assessment & Plan Note (Signed)
No acute injury and not tender to palpation but she noticed a prominence in her right clavicle about 9 days ago. Imaging does not find any concerns and is likely due to rotation caused by scoliosis will continue to monitor. She will report if she becomes symptomatic

## 2018-01-02 NOTE — Assessment & Plan Note (Signed)
Well controlled, no changes to meds. Encouraged heart healthy diet such as the DASH diet and exercise as tolerated.  °

## 2018-02-08 ENCOUNTER — Ambulatory Visit: Payer: Medicare HMO | Admitting: *Deleted

## 2018-02-18 NOTE — Progress Notes (Addendum)
Subjective:   [name] is a 67 y.o. female who presents for Medicare Annual (Subsequent) preventive examination.  Review of Systems: No ROS.  Medicare Wellness Visit. Additional risk factors are reflected in the social history. Cardiac Risk Factors include: advanced age (>84men, >7 women);dyslipidemia;hypertension;obesity (BMI >30kg/m2);sedentary lifestyle Sleep patterns:  No issues. Home Safety/Smoke Alarms: Feels safe in home. Smoke alarms in place. Lives in 1 story home with 3 cats.   Female:         Mammo-utd       Dexa scan- ordered       CCS- due as of 03/12/18. Pt states she will schedule.     Objective:     Vitals: BP 134/88 (BP Location: Left Arm, Patient Position: Sitting, Cuff Size: Normal)   Pulse 65   Ht 4\' 11"  (1.499 m)   Wt 183 lb 6.4 oz (83.2 kg)   SpO2 96%   BMI 37.04 kg/m   Body mass index is 37.04 kg/m.  Advanced Directives 02/21/2018 08/18/2017 08/18/2017 02/04/2016 02/27/2015 12/25/2014  Does Patient Have a Medical Advance Directive? Yes Yes No Yes Yes Yes  Type of Paramedic of [place];Living will Living will;Healthcare Power of [place];Living will [place];Living will -  Does patient want to make changes to medical advance directive? - No - Patient declined - - - No - Patient declined  Copy of [place] in Chart? No - copy requested No - copy requested - No - copy requested - No - copy requested  Would patient like information on creating a medical advance directive? - No - Patient declined No - Patient declined - - -    Tobacco Social History   Tobacco Use  Smoking Status Former Smoker  . Packs/day: 1.00  . Years: 15.00  . Pack years: 15.00  . Types: Cigarettes  . Last attempt to quit: 06/08/1982  . Years since quitting: 35.7  Smokeless Tobacco Never Used     Counseling given: Not Answered   Clinical Intake Pain : No/denies pain    Past  Medical History:  Diagnosis Date  . Arthritis   . Benign paroxysmal positional vertigo 11/14/2013  . Depression   . Essential hypertension, benign   . GERD (gastroesophageal reflux disease) 09/02/2013  . Glaucoma   . Glucose intolerance (impaired glucose tolerance)   . H/O tobacco use, presenting hazards to health 09/02/2013  . History  of basal cell carcinoma   . History of cardiac arrhythmia   . Hyperlipidemia   . Medicare welcome visit 11/28/2012  . Obesity 02/08/2017  . Palpitations 02/04/2017  . Personal history of colonic polyps 09/02/2013   Patient reports H/O 2 colonoscopies, last one dates back to 2012 in [place] She reports they were labeled as incomplete.  Reports 1st colonoscopy had 1 benign polyp and the 2nd was clear.   . Sleep apnea 10/01/2016   Past Surgical History:  Procedure Laterality Date  . CERVICAL CONE BIOPSY  1960s  . DILATION AND CURETTAGE OF UTERUS  1966  . KNEE SURGERY Left 11/01/2008   left knee total replacement Dr Sharol Given   Family History  Problem Relation Age of Onset  . Coronary artery disease Paternal Grandmother   . Hyperlipidemia Paternal Grandmother   . Hypertension Paternal Grandmother   . Stroke Paternal Grandmother   . Hypertension Mother   . Lung cancer Mother        smoker  . Uterine cancer  Mother   . Birth defects Mother        lung cancer in smoker  . COPD Mother   . Hypertension Father   . Kidney disease Father        Renal Cell Carcinoma  . Birth defects Father        renal cell CA, smoker  . Hypertension Maternal Grandmother   . Hypertension Maternal Grandfather   . Hypertension Paternal Grandfather   . Dementia Paternal Grandfather   . Cancer Brother        testicular  . Heart disease Brother        NI, smoker  . Hyperlipidemia Unknown   . Colon cancer Neg Hx    Social History   Socioeconomic History  . Marital status: Divorced    Spouse name: Not on file  . Number of children: Not on file  . Years of education: Not on file    . Highest education level: Not on file  Occupational History  . Not on file  Social Needs  . Financial resource strain: Not on file  . Food insecurity:    Worry: Not on file    Inability: Not on file  . Transportation needs:    Medical: Not on file    Non-medical: Not on file  Tobacco Use  . Smoking status: Former Smoker    Packs/day: 1.00    Years: 15.00    Pack years: 15.00    Types: Cigarettes    Last attempt to quit: 06/08/1982    Years since quitting: 35.7  . Smokeless tobacco: Never Used  Substance and Sexual Activity  . Alcohol use: Yes    Comment: wine w/ dinner 'once in a while'  . Drug use: No  . Sexual activity: Not Currently    Comment: lives alone, no dietary restrictions except no lactose,  Lifestyle  . Physical activity:    Days per week: Not on file    Minutes per session: Not on file  . Stress: Not on file  Relationships  . Social connections:    Talks on phone: Not on file    Gets together: Not on file    Attends religious service: Not on file    Active member of club or organization: Not on file    Attends meetings of clubs or organizations: Not on file    Relationship status: Not on file  Other Topics Concern  . Not on file  Social History Narrative   Occupation: unemployed Product manager- now retired   Divorced   no children    Moved from [place] alone with 4 cats   Enjoys gardening, planting.              Outpatient Encounter Medications as of 02/21/2018  Medication Sig  . aspirin 81 MG tablet Take 81 mg by mouth daily.    . fish oil-omega-3 fatty acids 1000 MG capsule Take 1 g by mouth daily.   . fluticasone (FLONASE) 50 MCG/ACT nasal spray Place 2 sprays into both nostrils daily as needed for allergies or rhinitis.  . hydrochlorothiazide (MICROZIDE) 12.5 MG capsule Take 1 capsule (12.5 mg total) by mouth daily.  Marland Kitchen ibuprofen (ADVIL,MOTRIN) 200 MG tablet Take 400 mg by mouth every 6 (six) hours as needed for headache or  moderate pain.   Marland Kitchen lovastatin (MEVACOR) 20 MG tablet Take 2 tablets (40 mg total) by mouth at bedtime.  . metoprolol tartrate (LOPRESSOR) 50 MG tablet Take 1  tablet (50 mg total) by mouth 2 (two) times daily.  . Multiple Vitamins-Minerals (MULTIVITAMIN WOMEN PO) Take 1 tablet by mouth daily.  Marland Kitchen oxymetazoline (AFRIN) 0.05 % nasal spray Place 1 spray into both nostrils 2 (two) times daily as needed for congestion.  Marland Kitchen tretinoin (RETIN-A) 0.1 % cream APPLY A PEA-SIZED AMOUNT TOPICALLY TO FACE AT BEDTIME (Patient taking differently: Apply topically to face once daily in the morning)   No facility-administered encounter medications on file as of 02/21/2018.     Activities of Daily Living In your present state of health, do you have any difficulty performing the following activities: 02/21/2018 08/18/2017  Hearing? N N  Vision? N N  Difficulty concentrating or making decisions? N N  Walking or climbing stairs? N N  Dressing or bathing? N N  Doing errands, shopping? N N  Preparing Food and eating ? N -  Using the Toilet? N -  In the past six months, have you accidently leaked urine? N -  Do you have problems with loss of bowel control? N -  Managing your Medications? N -  Managing your Finances? N -  Housekeeping or managing your Housekeeping? N -  Some recent data might be hidden    Patient Care Team: Mosie Lukes, MD as PCP - General (Family Medicine) Rolm Bookbinder, MD as Consulting Physician (Dermatology) Emily Filbert, MD as Consulting Physician (Obstetrics and Gynecology)    Assessment:   This is a routine wellness examination for [facility]. Physical assessment deferred to PCP.  Exercise Activities and Dietary recommendations Current Exercise Habits: The patient does not participate in regular exercise at present, Exercise limited by: None identified   Diet (meal preparation, eat out, water intake, caffeinated beverage s, dairy products, fruits and vegetables): pt states she has been  on Keto diet for a year.   Goals    . Reduce calorie intake to 2000 calories per day       Fall Risk Fall Risk  02/21/2018 02/04/2017 02/04/2016 12/25/2014 09/02/2013  Falls in the past year? Yes No Yes No No  Number falls in past yr: 1 - 1 - -  Comment - - Got up out of booth and R knee gave out. Fell and scratched eye. - -  Injury with Fall? - - No - -  Follow up Falls prevention discussed;Education provided - - - -    Depression Screen PHQ 2/9 Scores 02/21/2018 02/04/2017 02/04/2016 12/25/2014  PHQ - 2 Score 0 4 0 0  PHQ- 9 Score - 9 - -     Cognitive Function Ad8 score reviewed for issues:  Issues making decisions:no  Less interest in hobbies / activities:no  Repeats questions, stories (family complaining):no  Trouble using ordinary gadgets (microwave, computer, phone):no  Forgets the month or year: no  Mismanaging finances: no  Remembering appts:no  Daily problems with thinking and/or memory:no Ad8 score is=0   MMSE - Mini Mental State Exam 02/04/2016  Orientation to time 5  Orientation to Place 5  Registration 3  Attention/ Calculation 5  Recall 3  Language- name 2 objects 2  Language- repeat 1  Language- follow 3 step command 3  Language- read & follow direction 1  Write a sentence 1  Copy design 1  Total score 30        Immunization History  Administered Date(s) Administered  . Pneumococcal Conjugate-13 08/28/2013  . Pneumococcal Polysaccharide-23 10/16/2008, 12/25/2014  . Tdap 11/28/2012  . Zoster 12/02/2012   Screening Tests Health  Maintenance  Topic Date Due  . INFLUENZA VACCINE  01/06/2018  . COLONOSCOPY  03/12/2018  . MAMMOGRAM  10/20/2019  . TETANUS/TDAP  11/29/2022  . DEXA SCAN  Completed  . Hepatitis C Screening  Completed  . PNA vac Low Risk Adult  Completed       Plan:    Please schedule your next medicare wellness visit with me in 1 yr.  Continue doing brain stimulating activities (puzzles, reading, adult coloring books,  staying active) to keep memory sharp.   Continue to eat heart healthy diet (full of fruits, vegetables, whole grains, lean protein, water--limit salt, fat, and sugar intake) and increase physical activity as tolerated.  Bring a copy of your living will and/or healthcare power of attorney to your next office visit.    I have personally reviewed and noted the following in the patient's chart:   . Medical and social history . Use of alcohol, tobacco or illicit drugs  . Current medications and supplements . Functional ability and status . Nutritional status . Physical activity . Advanced directives . List of other physicians . Hospitalizations, surgeries, and ER visits in previous 12 months . Vitals . Screenings to include cognitive, depression, and falls . Referrals and appointments  In addition, I have reviewed and discussed with patient certain preventive protocols, quality metrics, and best practice recommendations. A written personalized care plan for preventive services as well as general preventive health recommendations were provided to patient.     Shela Nevin, [place]  02/21/2018   Medical screening examination/treatment was performed by qualified clinical staff member and as supervising physician I was immediately available for consultation/collaboration. I have reviewed documentation and agree with assessment and plan.  Penni Homans, MD

## 2018-02-21 ENCOUNTER — Encounter: Payer: Self-pay | Admitting: *Deleted

## 2018-02-21 ENCOUNTER — Ambulatory Visit (INDEPENDENT_AMBULATORY_CARE_PROVIDER_SITE_OTHER): Payer: Medicare HMO | Admitting: *Deleted

## 2018-02-21 VITALS — BP 134/88 | HR 65 | Ht 59.0 in | Wt 183.4 lb

## 2018-02-21 DIAGNOSIS — Z780 Asymptomatic menopausal state: Secondary | ICD-10-CM

## 2018-02-21 DIAGNOSIS — Z0000 Encounter for general adult medical examination without abnormal findings: Secondary | ICD-10-CM | POA: Diagnosis not present

## 2018-02-21 NOTE — Patient Instructions (Signed)
Please schedule your next medicare wellness visit with me in 1 yr.  Continue doing brain stimulating activities (puzzles, reading, adult coloring books, staying active) to keep memory sharp.   Continue to eat heart healthy diet (full of fruits, vegetables, whole grains, lean protein, water--limit salt, fat, and sugar intake) and increase physical activity as tolerated.  Bring a copy of your living will and/or healthcare power of attorney to your next office visit.   [name] , Thank you for taking time to come for your Medicare Wellness Visit. I appreciate your ongoing commitment to your health goals. Please review the following plan we discussed and let me know if I can assist you in the future.   These are the goals we discussed: Goals    . Reduce calorie intake to 2000 calories per day       This is a list of the screening recommended for you and due dates:  Health Maintenance  Topic Date Due  . Flu Shot  01/06/2018  . Colon Cancer Screening  03/12/2018  . Mammogram  10/20/2019  . Tetanus Vaccine  11/29/2022  . DEXA scan (bone density measurement)  Completed  .  Hepatitis C: One time screening is recommended by Center for Disease Control  (CDC) for  adults born from [birth year] through 1967.   Completed  . Pneumonia vaccines  Completed  ' Health Maintenance for Postmenopausal Women Menopause is a normal process in which your reproductive ability comes to an end. This process happens gradually over a span of months to years, usually between the ages of 62 and 89. Menopause is complete when you have missed 12 consecutive menstrual periods. It is important to talk with your health care provider about some of the most common conditions that affect postmenopausal women, such as heart disease, cancer, and bone loss (osteoporosis). Adopting a healthy lifestyle and getting preventive care can help to promote your health and wellness. Those actions can also lower your chances of developing some of  these common conditions. What should I know about menopause? During menopause, you may experience a number of symptoms, such as:  Moderate-to-severe hot flashes.  Night sweats.  Decrease in sex drive.  Mood swings.  Headaches.  Tiredness.  Irritability.  Memory problems.  Insomnia.  Choosing to treat or not to treat menopausal changes is an individual decision that you make with your health care provider. What should I know about hormone replacement therapy and supplements? Hormone therapy products are effective for treating symptoms that are associated with menopause, such as hot flashes and night sweats. Hormone replacement carries certain risks, especially as you become older. If you are thinking about using estrogen or estrogen with progestin treatments, discuss the benefits and risks with your health care provider. What should I know about heart disease and stroke? Heart disease, heart attack, and stroke become more likely as you age. This may be due, in part, to the hormonal changes that your body experiences during menopause. These can affect how your body processes dietary fats, triglycerides, and cholesterol. Heart attack and stroke are both medical emergencies. There are many things that you can do to help prevent heart disease and stroke:  Have your blood pressure checked at least every 1-2 years. High blood pressure causes heart disease and increases the risk of stroke.  If you are 83-58 years old, ask your health care provider if you should take aspirin to prevent a heart attack or a stroke.  Do not use any tobacco products,  including cigarettes, chewing tobacco, or electronic cigarettes. If you need help quitting, ask your health care provider.  It is important to eat a healthy diet and maintain a healthy weight. ? Be sure to include plenty of vegetables, fruits, low-fat dairy products, and lean protein. ? Avoid eating foods that are high in solid fats, added  sugars, or salt (sodium).  Get regular exercise. This is one of the most important things that you can do for your health. ? Try to exercise for at least 150 minutes each week. The type of exercise that you do should increase your heart rate and make you sweat. This is known as moderate-intensity exercise. ? Try to do strengthening exercises at least twice each week. Do these in addition to the moderate-intensity exercise.  Know your numbers.Ask your health care provider to check your cholesterol and your blood glucose. Continue to have your blood tested as directed by your health care provider.  What should I know about cancer screening? There are several types of cancer. Take the following steps to reduce your risk and to catch any cancer development as early as possible. Breast Cancer  Practice breast self-awareness. ? This means understanding how your breasts normally appear and feel. ? It also means doing regular breast self-exams. Let your health care provider know about any changes, no matter how small.  If you are 30 or older, have a clinician do a breast exam (clinical breast exam or CBE) every year. Depending on your age, family history, and medical history, it may be recommended that you also have a yearly breast X-ray (mammogram).  If you have a family history of breast cancer, talk with your health care provider about genetic screening.  If you are at high risk for breast cancer, talk with your health care provider about having an MRI and a mammogram every year.  Breast cancer (BRCA) gene test is recommended for women who have family members with BRCA-related cancers. Results of the assessment will determine the need for genetic counseling and BRCA1 and for BRCA2 testing. BRCA-related cancers include these types: ? Breast. This occurs in males or females. ? Ovarian. ? Tubal. This may also be called fallopian tube cancer. ? Cancer of the abdominal or pelvic lining (peritoneal  cancer). ? Prostate. ? Pancreatic.  Cervical, Uterine, and Ovarian Cancer Your health care provider may recommend that you be screened regularly for cancer of the pelvic organs. These include your ovaries, uterus, and vagina. This screening involves a pelvic exam, which includes checking for microscopic changes to the surface of your cervix (Pap test).  For women ages 21-65, health care providers may recommend a pelvic exam and a Pap test every three years. For women ages 24-65, they may recommend the Pap test and pelvic exam, combined with testing for human papilloma virus (HPV), every five years. Some types of HPV increase your risk of cervical cancer. Testing for HPV may also be done on women of any age who have unclear Pap test results.  Other health care providers may not recommend any screening for nonpregnant women who are considered low risk for pelvic cancer and have no symptoms. Ask your health care provider if a screening pelvic exam is right for you.  If you have had past treatment for cervical cancer or a condition that could lead to cancer, you need Pap tests and screening for cancer for at least 20 years after your treatment. If Pap tests have been discontinued for you, your risk factors (such  as having a new sexual partner) need to be reassessed to determine if you should start having screenings again. Some women have medical problems that increase the chance of getting cervical cancer. In these cases, your health care provider may recommend that you have screening and Pap tests more often.  If you have a family history of uterine cancer or ovarian cancer, talk with your health care provider about genetic screening.  If you have vaginal bleeding after reaching menopause, tell your health care provider.  There are currently no reliable tests available to screen for ovarian cancer.  Lung Cancer Lung cancer screening is recommended for adults 76-68 years old who are at high risk for  lung cancer because of a history of smoking. A yearly low-dose CT scan of the lungs is recommended if you:  Currently smoke.  Have a history of at least 30 pack-years of smoking and you currently smoke or have quit within the past 15 years. A pack-year is smoking an average of one pack of cigarettes per day for one year.  Yearly screening should:  Continue until it has been 15 years since you quit.  Stop if you develop a health problem that would prevent you from having lung cancer treatment.  Colorectal Cancer  This type of cancer can be detected and can often be prevented.  Routine colorectal cancer screening usually begins at age 56 and continues through age 8.  If you have risk factors for colon cancer, your health care provider may recommend that you be screened at an earlier age.  If you have a family history of colorectal cancer, talk with your health care provider about genetic screening.  Your health care provider may also recommend using home test kits to check for hidden blood in your stool.  A small camera at the end of a tube can be used to examine your colon directly (sigmoidoscopy or colonoscopy). This is done to check for the earliest forms of colorectal cancer.  Direct examination of the colon should be repeated every 5-10 years until age 34. However, if early forms of precancerous polyps or small growths are found or if you have a family history or genetic risk for colorectal cancer, you may need to be screened more often.  Skin Cancer  Check your skin from head to toe regularly.  Monitor any moles. Be sure to tell your health care provider: ? About any new moles or changes in moles, especially if there is a change in a mole's shape or color. ? If you have a mole that is larger than the size of a pencil eraser.  If any of your family members has a history of skin cancer, especially at a young age, talk with your health care provider about genetic  screening.  Always use sunscreen. Apply sunscreen liberally and repeatedly throughout the day.  Whenever you are outside, protect yourself by wearing long sleeves, pants, a wide-brimmed hat, and sunglasses.  What should I know about osteoporosis? Osteoporosis is a condition in which bone destruction happens more quickly than new bone creation. After menopause, you may be at an increased risk for osteoporosis. To help prevent osteoporosis or the bone fractures that can happen because of osteoporosis, the following is recommended:  If you are 75-33 years old, get at least 1,000 mg of calcium and at least 600 mg of vitamin D per day.  If you are older than age 39 but younger than age 46, get at least 1,200 mg of calcium  and at least 600 mg of vitamin D per day.  If you are older than age 88, get at least 1,200 mg of calcium and at least 800 mg of vitamin D per day.  Smoking and excessive alcohol intake increase the risk of osteoporosis. Eat foods that are rich in calcium and vitamin D, and do weight-bearing exercises several times each week as directed by your health care provider. What should I know about how menopause affects my mental health? Depression may occur at any age, but it is more common as you become older. Common symptoms of depression include:  Low or sad mood.  Changes in sleep patterns.  Changes in appetite or eating patterns.  Feeling an overall lack of motivation or enjoyment of activities that you previously enjoyed.  Frequent crying spells.  Talk with your health care provider if you think that you are experiencing depression. What should I know about immunizations? It is important that you get and maintain your immunizations. These include:  Tetanus, diphtheria, and pertussis (Tdap) booster vaccine.  Influenza every year before the flu season begins.  Pneumonia vaccine.  Shingles vaccine.  Your health care provider may also recommend other  immunizations. This information is not intended to replace advice given to you by your health care provider. Make sure you discuss any questions you have with your health care provider. Document Released: 07/17/2005 Document Revised: 12/13/2015 Document Reviewed: 02/26/2015 Elsevier Interactive Patient Education  2018 [employer].

## 2018-02-23 ENCOUNTER — Telehealth: Payer: Self-pay | Admitting: *Deleted

## 2018-02-23 ENCOUNTER — Ambulatory Visit (HOSPITAL_BASED_OUTPATIENT_CLINIC_OR_DEPARTMENT_OTHER)
Admission: RE | Admit: 2018-02-23 | Discharge: 2018-02-23 | Disposition: A | Discharge status: Home / Self Care | Payer: Medicare HMO | Source: Ambulatory Visit | Attending: Family Medicine | Admitting: Family Medicine

## 2018-02-23 DIAGNOSIS — Z780 Asymptomatic menopausal state: Secondary | ICD-10-CM | POA: Diagnosis not present

## 2018-02-23 DIAGNOSIS — Z13820 Encounter for screening for osteoporosis: Secondary | ICD-10-CM | POA: Diagnosis not present

## 2018-03-02 DIAGNOSIS — R69 Illness, unspecified: Secondary | ICD-10-CM | POA: Diagnosis not present

## 2018-04-03 ENCOUNTER — Encounter: Payer: Self-pay | Admitting: Family Medicine

## 2018-04-03 DIAGNOSIS — R300 Dysuria: Secondary | ICD-10-CM

## 2018-04-04 ENCOUNTER — Encounter: Payer: Self-pay | Admitting: Gastroenterology

## 2018-04-04 ENCOUNTER — Other Ambulatory Visit: Payer: Self-pay | Admitting: Family Medicine

## 2018-04-04 ENCOUNTER — Other Ambulatory Visit (INDEPENDENT_AMBULATORY_CARE_PROVIDER_SITE_OTHER): Payer: Medicare HMO

## 2018-04-04 DIAGNOSIS — R300 Dysuria: Secondary | ICD-10-CM | POA: Diagnosis not present

## 2018-04-04 LAB — POC URINALSYSI DIPSTICK (AUTOMATED)
BILIRUBIN UA: NEGATIVE
GLUCOSE UA: NEGATIVE
KETONES UA: NEGATIVE
NITRITE UA: NEGATIVE
Protein, UA: POSITIVE — AB
SPEC GRAV UA: 1.015 (ref 1.010–1.025)
Urobilinogen, UA: 0.2 E.U./dL
pH, UA: 6 (ref 5.0–8.0)

## 2018-04-04 MED ORDER — AMOXICILLIN 500 MG PO CAPS: 500.0000 mg | ORAL_CAPSULE | Freq: Three times a day (TID) | ORAL | 0 refills | Status: DC

## 2018-04-04 NOTE — Addendum Note (Signed)
Addended by: Magdalene Molly A on: 04/04/2018 09:25 AM   Modules accepted: Orders

## 2018-04-04 NOTE — Telephone Encounter (Signed)
I will call the patient and have her come in to leave a urine sample

## 2018-04-05 ENCOUNTER — Encounter: Payer: Self-pay | Admitting: Family Medicine

## 2018-04-06 ENCOUNTER — Other Ambulatory Visit: Payer: Self-pay | Admitting: Family Medicine

## 2018-04-06 LAB — URINE CULTURE
MICRO NUMBER:: 91293281
SPECIMEN QUALITY:: ADEQUATE

## 2018-04-06 MED ORDER — AMOXICILLIN-POT CLAVULANATE 875-125 MG PO TABS: 1.0000 | ORAL_TABLET | Freq: Two times a day (BID) | ORAL | 0 refills | Status: DC

## 2018-04-12 ENCOUNTER — Other Ambulatory Visit: Payer: Self-pay | Admitting: Family Medicine

## 2018-04-12 DIAGNOSIS — E739 Lactose intolerance, unspecified: Secondary | ICD-10-CM

## 2018-04-12 DIAGNOSIS — I10 Essential (primary) hypertension: Secondary | ICD-10-CM

## 2018-04-12 DIAGNOSIS — E785 Hyperlipidemia, unspecified: Secondary | ICD-10-CM

## 2018-04-13 DIAGNOSIS — H52223 Regular astigmatism, bilateral: Secondary | ICD-10-CM | POA: Diagnosis not present

## 2018-04-14 DIAGNOSIS — Z0100 Encounter for examination of eyes and vision without abnormal findings: Secondary | ICD-10-CM | POA: Diagnosis not present

## 2018-04-14 DIAGNOSIS — H52223 Regular astigmatism, bilateral: Secondary | ICD-10-CM | POA: Diagnosis not present

## 2018-04-14 DIAGNOSIS — H524 Presbyopia: Secondary | ICD-10-CM | POA: Diagnosis not present

## 2018-04-17 ENCOUNTER — Other Ambulatory Visit: Payer: Self-pay | Admitting: Family Medicine

## 2018-04-17 DIAGNOSIS — E785 Hyperlipidemia, unspecified: Secondary | ICD-10-CM

## 2018-04-17 DIAGNOSIS — I10 Essential (primary) hypertension: Secondary | ICD-10-CM

## 2018-04-17 DIAGNOSIS — E739 Lactose intolerance, unspecified: Secondary | ICD-10-CM

## 2018-04-25 DIAGNOSIS — H43811 Vitreous degeneration, right eye: Secondary | ICD-10-CM | POA: Diagnosis not present

## 2018-04-25 DIAGNOSIS — G453 Amaurosis fugax: Secondary | ICD-10-CM | POA: Diagnosis not present

## 2018-05-25 ENCOUNTER — Telehealth: Payer: Self-pay | Admitting: Gastroenterology

## 2018-05-25 NOTE — Telephone Encounter (Signed)
Please advise on previous messageS;

## 2018-05-25 NOTE — Telephone Encounter (Signed)
Yes, that is fine with me if okay with Dr. Bryan Lemma.

## 2018-05-25 NOTE — Telephone Encounter (Signed)
Please see previous messages concerning patient transfer of care

## 2018-05-25 NOTE — Telephone Encounter (Signed)
Please see previous message concerning patient switching providers

## 2018-05-25 NOTE — Telephone Encounter (Signed)
Absolutely, happy to see her in the [facility]. Thanks.

## 2018-05-26 ENCOUNTER — Encounter: Payer: Self-pay | Admitting: Gastroenterology

## 2018-05-26 NOTE — Telephone Encounter (Signed)
Left message for patient to call back and schedule an appointment with Dr.Cirigliano in [place].

## 2018-06-20 ENCOUNTER — Encounter: Payer: Medicare HMO | Admitting: Family Medicine

## 2018-06-24 ENCOUNTER — Ambulatory Visit: Payer: Medicare HMO | Admitting: Gastroenterology

## 2018-06-24 ENCOUNTER — Encounter: Payer: Self-pay | Admitting: Gastroenterology

## 2018-06-24 VITALS — BP 132/80 | HR 62 | Ht 61.0 in | Wt 185.4 lb

## 2018-06-24 DIAGNOSIS — K5730 Diverticulosis of large intestine without perforation or abscess without bleeding: Secondary | ICD-10-CM | POA: Diagnosis not present

## 2018-06-24 DIAGNOSIS — Z86010 Personal history of colonic polyps: Secondary | ICD-10-CM

## 2018-06-24 NOTE — Progress Notes (Signed)
Chief Complaint: Polyp surveillance   Referring Provider:     Penni Homans, MD   HPI:    [name] is a 68 y.o. female presenting [facility] for evaluation of repeat colonoscopy.  She was last seen in 2016 by Dr. Havery Moros for [facility] screening.  Colonoscopy in 03/2015 notable for 5 subcentimeter Tubular Adenomas, all resected, severe sigmoid diverticulosis and ascending colon diverticulosis.   In December she reports a diverticulitis flare. Has had 3 in her lifetime. Described LLQ pain x1 day, started herself on leftover Augmentin (from prior UTI) with resolution of sxs within 24 hours. No recurrence. She is o/w without any GI sxs. No hematochezia, melena. No n/v/f/c/d/c.   Labs from March 2019 with normal CBC, CMP, and INR.  Endoscopic Hx: - Colonoscopy (2016, Dr. Havery Moros): 5 subcentimeter polyps, 4Tubular Adenomas, all resected, severe sigmoid diverticulosis and mild ascending colon diverticulosis, internal hemorrhoids. Recommended repeat in 3 years.  Past Medical History:  Diagnosis Date  . Arthritis   . Benign paroxysmal positional vertigo 11/14/2013  . Colon polyp   . Depression   . Diverticulosis   . Essential hypertension, benign   . GERD (gastroesophageal reflux disease) 09/02/2013  . Glaucoma   . Glucose intolerance (impaired glucose tolerance)   . H/O tobacco use, presenting hazards to health 09/02/2013  . History  of basal cell carcinoma   . History of cardiac arrhythmia   . Hyperlipidemia   . Hypertension   . Medicare welcome visit 11/28/2012  . Obesity 02/08/2017  . Palpitations 02/04/2017  . Personal history of colonic polyps 09/02/2013   Patient reports H/O 2 colonoscopies, last one dates back to 2012 in [place] She reports they were labeled as incomplete.  Reports 1st colonoscopy had 1 benign polyp and the 2nd was clear.   . Sleep apnea 10/01/2016   Not on CPAP machine   . UTI (urinary tract infection)      Past Surgical  History:  Procedure Laterality Date  . CERVICAL CONE BIOPSY  1960s  . COLONOSCOPY  2016  . DILATION AND CURETTAGE OF UTERUS  1966  . KNEE SURGERY Left 11/01/2008   left knee total replacement Dr Sharol Given   Family History  Problem Relation Age of Onset  . Coronary artery disease Paternal Grandmother   . Hyperlipidemia Paternal Grandmother   . Hypertension Paternal Grandmother   . Stroke Paternal Grandmother   . Hypertension Mother   . Lung cancer Mother        smoker  . Uterine cancer Mother   . Birth defects Mother        lung cancer in smoker  . COPD Mother   . Hypertension Father   . Kidney disease Father        Renal Cell Carcinoma  . Birth defects Father        renal cell CA, smoker  . Hypertension Maternal Grandmother   . Hypertension Maternal Grandfather   . Hypertension Paternal Grandfather   . Dementia Paternal Grandfather   . Cancer Brother        testicular  . Heart disease Brother        NI, smoker  . Hyperlipidemia Other   . Colon cancer Neg Hx   . Esophageal cancer Neg Hx    Social History   Tobacco Use  . Smoking status: Former Smoker    Packs/day: 1.00    Years: 15.00  Pack years: 15.00    Types: Cigarettes    Last attempt to quit: 06/08/1982    Years since quitting: 36.0  . Smokeless tobacco: Never Used  Substance Use Topics  . Alcohol use: Yes    Comment: wine w/ dinner 'once in a while'  . Drug use: No   Current Outpatient Medications  Medication Sig Dispense Refill  . aspirin 81 MG tablet Take 81 mg by mouth daily.      . fish oil-omega-3 fatty acids 1000 MG capsule Take 1 g by mouth daily.     . hydrochlorothiazide (MICROZIDE) 12.5 MG capsule Take 1 capsule (12.5 mg total) by mouth daily. 90 capsule 3  . ibuprofen (ADVIL,MOTRIN) 200 MG tablet Take 400 mg by mouth every 6 (six) hours as needed for headache or moderate pain.     Marland Kitchen lovastatin (MEVACOR) 20 MG tablet Take 2 tablets (40 mg total) by mouth at bedtime. 180 tablet 3  . metoprolol  tartrate (LOPRESSOR) 50 MG tablet Take 1 tablet (50 mg total) by mouth 2 (two) times daily. 180 tablet 3  . Multiple Vitamins-Minerals (MULTIVITAMIN WOMEN PO) Take 1 tablet by mouth daily.    Marland Kitchen oxymetazoline (AFRIN) 0.05 % nasal spray Place 1 spray into both nostrils 2 (two) times daily as needed for congestion.    Marland Kitchen tretinoin (RETIN-A) 0.1 % cream APPLY  CREAM TOPICALLY TO AFFECTED AREA EVERY DAY AT BEDTIME ,PEA-SIZED  AMOUNT  TO  FACE 45 g 5   No current facility-administered medications for this visit.    No Known Allergies   Review of Systems: All systems reviewed and negative except where noted in HPI.     Physical Exam:    Wt Readings from Last 3 Encounters:  06/24/18 185 lb 6 oz (84.1 kg)  02/21/18 183 lb 6.4 oz (83.2 kg)  12/27/17 179 lb 12.8 oz (81.6 kg)    BP 132/80   Pulse 62   Ht 5\' 1"  (1.549 m)   Wt 185 lb 6 oz (84.1 kg)   BMI 35.03 kg/m  Constitutional:  Pleasant, in no acute distress. Psychiatric: Normal mood and affect. Behavior is normal. EENT: Pupils normal.  Conjunctivae are normal. No scleral icterus. Neck supple. No cervical LAD. Cardiovascular: Normal rate, regular rhythm. No edema Pulmonary/chest: Effort normal and breath sounds normal. No wheezing, rales or rhonchi. Abdominal: Soft, nondistended, nontender. Bowel sounds active throughout. There are no masses palpable. No hepatomegaly. Neurological: Alert and oriented to person place and time. Skin: Skin is warm and dry. No rashes noted.   ASSESSMENT AND PLAN;   [name] is a 68 y.o. female presenting with: 1) Hx of Colon Polyps: Last c-scope in 2016 with 4 subcentimeter TAs.  - Repeat colonoscopy for ongoing surveillance  2) Diverticulosis: Known hx of diverticulosis with ?recent mild episode of diverticulitis. No longer with sxs.  - Colonsocopy to be scheduled 6-8 weeks from recent episode -Evaluate for resolution of diverticulitis at time of colonoscopy.  Also evaluate for segmental  colitis, luminal narrowing, mucosal inflammation, etc. at time of colonoscopy above  The indications, risks, and benefits of colonoscopy were explained to the patient in detail. Risks include but are not limited to bleeding, perforation, adverse reaction to medications, and cardiopulmonary compromise. Sequelae include but are not limited to the possibility of surgery, hospitalization, and mortality. The patient verbalized understanding and wished to proceed. All questions answered, referred to the scheduler and bowel prep ordered. Further recommendations pending results of the exam.  Lavena Bullion, DO, FACG  06/24/2018, 11:47 AM   Mosie Lukes, MD

## 2018-06-24 NOTE — Patient Instructions (Addendum)
If you are age 70 or older, your body mass index should be between 23-30. Your Body mass index is 35.03 kg/m. If this is out of the aforementioned range listed, please consider follow up with your Primary Care Provider.  If you are age 51 or younger, your body mass index should be between 19-25. Your Body mass index is 35.03 kg/m. If this is out of the aformentioned range listed, please consider follow up with your Primary Care Provider.   Call us back at [phone] to schedule your Colonoscopy in March 2020.  It was a pleasure to see you today!  Vito Cirigliano, D.O.

## 2018-06-27 DIAGNOSIS — Z0100 Encounter for examination of eyes and vision without abnormal findings: Secondary | ICD-10-CM | POA: Diagnosis not present

## 2018-07-01 ENCOUNTER — Ambulatory Visit (INDEPENDENT_AMBULATORY_CARE_PROVIDER_SITE_OTHER): Payer: Medicare HMO | Admitting: Family Medicine

## 2018-07-01 ENCOUNTER — Encounter: Payer: Self-pay | Admitting: Family Medicine

## 2018-07-01 DIAGNOSIS — Z860100 Personal history of colon polyps, unspecified: Secondary | ICD-10-CM

## 2018-07-01 DIAGNOSIS — I10 Essential (primary) hypertension: Secondary | ICD-10-CM

## 2018-07-01 DIAGNOSIS — E739 Lactose intolerance, unspecified: Secondary | ICD-10-CM

## 2018-07-01 DIAGNOSIS — E785 Hyperlipidemia, unspecified: Secondary | ICD-10-CM

## 2018-07-01 DIAGNOSIS — Z0000 Encounter for general adult medical examination without abnormal findings: Secondary | ICD-10-CM

## 2018-07-01 LAB — LIPID PANEL
CHOLESTEROL: 185 mg/dL (ref 0–200)
HDL: 62.7 mg/dL (ref >39.00)
LDL CALC: 103 mg/dL — AB (ref 0–99)
NonHDL: 122.77
TRIGLYCERIDES: 97 mg/dL (ref 0.0–149.0)
Total CHOL/HDL Ratio: 3
VLDL: 19.4 mg/dL (ref 0.0–40.0)

## 2018-07-01 LAB — COMPREHENSIVE METABOLIC PANEL
ALBUMIN: 4.6 g/dL (ref 3.5–5.2)
ALT: 16 U/L (ref 0–35)
AST: 18 U/L (ref 0–37)
Alkaline Phosphatase: 38 U/L — ABNORMAL LOW (ref 39–117)
BUN: 22 mg/dL (ref 6–23)
CHLORIDE: 101 meq/L (ref 96–112)
CO2: 31 meq/L (ref 19–32)
CREATININE: 0.86 mg/dL (ref 0.40–1.20)
Calcium: 10.4 mg/dL (ref 8.4–10.5)
GFR: 65.23 mL/min (ref >60.00)
Glucose, Bld: 97 mg/dL (ref 70–99)
Potassium: 4.4 mEq/L (ref 3.5–5.1)
SODIUM: 140 meq/L (ref 135–145)
Total Bilirubin: 0.6 mg/dL (ref 0.2–1.2)
Total Protein: 7.1 g/dL (ref 6.0–8.3)

## 2018-07-01 LAB — CBC
HCT: 41.1 % (ref 36.0–46.0)
Hemoglobin: 13.7 g/dL (ref 12.0–15.0)
MCHC: 33.4 g/dL (ref 30.0–36.0)
MCV: 95.1 fl (ref 78.0–100.0)
Platelets: 210 10*3/uL (ref 150.0–400.0)
RBC: 4.32 Mil/uL (ref 3.87–5.11)
RDW: 13.6 % (ref 11.5–15.5)
WBC: 7.2 10*3/uL (ref 4.0–10.5)

## 2018-07-01 LAB — TSH: TSH: 2.42 u[IU]/mL (ref 0.35–4.50)

## 2018-07-01 LAB — HEMOGLOBIN A1C: Hgb A1c MFr Bld: 5.5 % (ref 4.6–6.5)

## 2018-07-01 MED ORDER — METOPROLOL TARTRATE 50 MG PO TABS: 50.0000 mg | ORAL_TABLET | Freq: Two times a day (BID) | ORAL | 3 refills | Status: DC

## 2018-07-01 MED ORDER — HYOSCYAMINE SULFATE 0.125 MG SL SUBL: 0.1250 mg | SUBLINGUAL_TABLET | SUBLINGUAL | 0 refills | Status: DC | PRN

## 2018-07-01 MED ORDER — LOVASTATIN 20 MG PO TABS: 40.0000 mg | ORAL_TABLET | Freq: Every day | ORAL | 3 refills | Status: DC

## 2018-07-01 MED ORDER — HYDROCHLOROTHIAZIDE 12.5 MG PO CAPS: 12.5000 mg | ORAL_CAPSULE | Freq: Every day | ORAL | 3 refills | Status: DC

## 2018-07-01 NOTE — Assessment & Plan Note (Signed)
hgba1c acceptable, minimize simple carbs. Increase exercise as tolerated. Continue current meds 

## 2018-07-01 NOTE — Assessment & Plan Note (Signed)
Well controlled, no changes to meds. Encouraged heart healthy diet such as the DASH diet and exercise as tolerated.  °

## 2018-07-01 NOTE — Patient Instructions (Addendum)
Biotin, Zinc and fatty acids, hydrate  shingrix is the new shingles shot 2 shots over 2-6 months, at [place] 70 Years and Older, Female Preventive care refers to lifestyle choices and visits with your health care provider that can promote health and wellness. What does preventive care include?  A yearly physical exam. This is also called an annual well check.  Dental exams once or twice a year.  Routine eye exams. Ask your health care provider how often you should have your eyes checked.  Personal lifestyle choices, including: ? Daily care of your teeth and gums. ? Regular physical activity. ? Eating a healthy diet. ? Avoiding tobacco and drug use. ? Limiting alcohol use. ? Practicing safe sex. ? Taking low-dose aspirin every day. ? Taking vitamin and mineral supplements as recommended by your health care provider. What happens during an annual well check? The services and screenings done by your health care provider during your annual well check will depend on your age, overall health, lifestyle risk factors, and family history of disease. Counseling Your health care provider may ask you questions about your:  Alcohol use.  Tobacco use.  Drug use.  Emotional well-being.  Home and relationship well-being.  Sexual activity.  Eating habits.  History of falls.  Memory and ability to understand (cognition).  Work and work Statistician.  Reproductive health.  Screening You may have the following tests or measurements:  Height, weight, and BMI.  Blood pressure.  Lipid and cholesterol levels. These may be checked every 5 years, or more frequently if you are over 18 years old.  Skin check.  Lung cancer screening. You may have this screening every year starting at age 86 if you have a 30-pack-year history of smoking and currently smoke or have quit within the past 15 years.  Colorectal cancer screening. All adults should have this screening  starting at age 40 and continuing until age 30. You will have tests every 1-10 years, depending on your results and the type of screening test. People at increased risk should start screening at an earlier age. Screening tests may include: ? Guaiac-based fecal occult blood testing. ? Fecal immunochemical test (FIT). ? Stool DNA test. ? Virtual colonoscopy. ? Sigmoidoscopy. During this test, a flexible tube with a tiny camera (sigmoidoscope) is used to examine your rectum and lower colon. The sigmoidoscope is inserted through your anus into your rectum and lower colon. ? Colonoscopy. During this test, a long, thin, flexible tube with a tiny camera (colonoscope) is used to examine your entire colon and rectum.  Hepatitis C blood test.  Hepatitis B blood test.  Sexually transmitted disease (STD) testing.  Diabetes screening. This is done by checking your blood sugar (glucose) after you have not eaten for a while (fasting). You may have this done every 1-3 years.  Bone density scan. This is done to screen for osteoporosis. You may have this done starting at age 76.  Mammogram. This may be done every 1-2 years. Talk to your health care provider about how often you should have regular mammograms. Talk with your health care provider about your test results, treatment options, and if necessary, the need for more tests. Vaccines Your health care provider may recommend certain vaccines, such as:  Influenza vaccine. This is recommended every year.  Tetanus, diphtheria, and acellular pertussis (Tdap, Td) vaccine. You may need a Td booster every 10 years.  Varicella vaccine. You may need this if you have not been vaccinated.  Zoster  vaccine. You may need this after age 54.  Measles, mumps, and rubella (MMR) vaccine. You may need at least one dose of MMR if you were born in 1957 or later. You may also need a second dose.  Pneumococcal 13-valent conjugate (PCV13) vaccine. One dose is recommended  after age 66.  Pneumococcal polysaccharide (PPSV23) vaccine. One dose is recommended after age 19.  Meningococcal vaccine. You may need this if you have certain conditions.  Hepatitis A vaccine. You may need this if you have certain conditions or if you travel or work in places where you may be exposed to hepatitis A.  Hepatitis B vaccine. You may need this if you have certain conditions or if you travel or work in places where you may be exposed to hepatitis B.  Haemophilus influenzae type b (Hib) vaccine. You may need this if you have certain conditions. Talk to your health care provider about which screenings and vaccines you need and how often you need them. This information is not intended to replace advice given to you by your health care provider. Make sure you discuss any questions you have with your health care provider. Document Released: 06/21/2015 Document Revised: 07/15/2017 Document Reviewed: 03/26/2015 Elsevier Interactive Patient Education  2019 [employer].

## 2018-07-01 NOTE — Assessment & Plan Note (Signed)
Encouraged heart healthy diet, increase exercise, avoid trans fats, consider a krill oil cap daily 

## 2018-07-03 NOTE — Assessment & Plan Note (Signed)
Patient encouraged to maintain heart healthy diet, regular exercise, adequate sleep. Consider daily probiotics. Take medications as prescribed 

## 2018-07-03 NOTE — Assessment & Plan Note (Signed)
Following with Dr Bryan Lemma of [facility] now

## 2018-07-03 NOTE — Progress Notes (Signed)
Subjective:    Patient ID: [name], female    DOB: 12-07-1950, 68 y.o.   MRN: [id number]  No chief complaint on file.   HPI Patient is in today for annual preventative exam and follow up on chronic medical concerns including hypertension, hyperlipidemia and colon polyps. She feels well today. Her greatest complaint is worsening hair loss. No itching or lesions on scalp. No polyuria or polydipsia. Denies CP/palp/SOB/HA/congestion/fevers/GI or GU c/o. Taking meds as prescribed  Past Medical History:  Diagnosis Date  . Arthritis   . Benign paroxysmal positional vertigo 11/14/2013  . Colon polyp   . Depression   . Diverticulosis   . Essential hypertension, benign   . GERD (gastroesophageal reflux disease) 09/02/2013  . Glaucoma   . Glucose intolerance (impaired glucose tolerance)   . H/O tobacco use, presenting hazards to health 09/02/2013  . History  of basal cell carcinoma   . History of cardiac arrhythmia   . Hyperlipidemia   . Hypertension   . Medicare welcome visit 11/28/2012  . Obesity 02/08/2017  . Palpitations 02/04/2017  . Personal history of colonic polyps 09/02/2013   Patient reports H/O 2 colonoscopies, last one dates back to 2012 in [place] She reports they were labeled as incomplete.  Reports 1st colonoscopy had 1 benign polyp and the 2nd was clear.   . Sleep apnea 10/01/2016   Not on CPAP machine   . UTI (urinary tract infection)     Past Surgical History:  Procedure Laterality Date  . CERVICAL CONE BIOPSY  1960s  . COLONOSCOPY  2016  . DILATION AND CURETTAGE OF UTERUS  1966  . KNEE SURGERY Left 11/01/2008   left knee total replacement Dr Sharol Given    Family History  Problem Relation Age of Onset  . Coronary artery disease Paternal Grandmother   . Hyperlipidemia Paternal Grandmother   . Hypertension Paternal Grandmother   . Stroke Paternal Grandmother   . Hypertension Mother   . Lung cancer Mother        smoker  . Uterine cancer Mother   . Birth defects Mother          lung cancer in smoker  . COPD Mother   . Hypertension Father   . Kidney disease Father        Renal Cell Carcinoma  . Birth defects Father        renal cell CA, smoker  . Hypertension Maternal Grandmother   . Hypertension Maternal Grandfather   . Hypertension Paternal Grandfather   . Dementia Paternal Grandfather   . Cancer Brother        testicular  . Heart disease Brother        NI, smoker  . Hyperlipidemia Other   . Colon cancer Neg Hx   . Esophageal cancer Neg Hx     Social History   Socioeconomic History  . Marital status: Divorced    Spouse name: Not on file  . Number of children: Not on file  . Years of education: Not on file  . Highest education level: Not on file  Occupational History  . Occupation: Retired   Scientific laboratory technician  . Financial resource strain: Not on file  . Food insecurity:    Worry: Not on file    Inability: Not on file  . Transportation needs:    Medical: Not on file    Non-medical: Not on file  Tobacco Use  . Smoking status: Former Smoker    Packs/day: 1.00  Years: 15.00    Pack years: 15.00    Types: Cigarettes    Last attempt to quit: 06/08/1982    Years since quitting: 36.0  . Smokeless tobacco: Never Used  Substance and Sexual Activity  . Alcohol use: Yes    Comment: wine w/ dinner 'once in a while'  . Drug use: No  . Sexual activity: Not Currently    Comment: lives alone, no dietary restrictions except no lactose,  Lifestyle  . Physical activity:    Days per week: Not on file    Minutes per session: Not on file  . Stress: Not on file  Relationships  . Social connections:    Talks on phone: Not on file    Gets together: Not on file    Attends religious service: Not on file    Active member of club or organization: Not on file    Attends meetings of clubs or organizations: Not on file    Relationship status: Not on file  . Intimate partner violence:    Fear of current or ex partner: Not on file    Emotionally abused:  Not on file    Physically abused: Not on file    Forced sexual activity: Not on file  Other Topics Concern  . Not on file  Social History Narrative   Occupation: unemployed Product manager- now retired   Divorced   no children    Moved from [place] alone with 4 cats   Enjoys gardening, planting.              Outpatient Medications Prior to Visit  Medication Sig Dispense Refill  . aspirin 81 MG tablet Take 81 mg by mouth daily.      . fish oil-omega-3 fatty acids 1000 MG capsule Take 1 g by mouth daily.     Marland Kitchen ibuprofen (ADVIL,MOTRIN) 200 MG tablet Take 400 mg by mouth every 6 (six) hours as needed for headache or moderate pain.     . Multiple Vitamins-Minerals (MULTIVITAMIN WOMEN PO) Take 1 tablet by mouth daily.    Marland Kitchen oxymetazoline (AFRIN) 0.05 % nasal spray Place 1 spray into both nostrils 2 (two) times daily as needed for congestion.    Marland Kitchen tretinoin (RETIN-A) 0.1 % cream APPLY  CREAM TOPICALLY TO AFFECTED AREA EVERY DAY AT BEDTIME ,PEA-SIZED  AMOUNT  TO  FACE 45 g 5  . hydrochlorothiazide (MICROZIDE) 12.5 MG capsule Take 1 capsule (12.5 mg total) by mouth daily. 90 capsule 3  . lovastatin (MEVACOR) 20 MG tablet Take 2 tablets (40 mg total) by mouth at bedtime. 180 tablet 3  . metoprolol tartrate (LOPRESSOR) 50 MG tablet Take 1 tablet (50 mg total) by mouth 2 (two) times daily. 180 tablet 3   No facility-administered medications prior to visit.     No Known Allergies  Review of Systems  Constitutional: Negative for chills, fever and malaise/fatigue.  HENT: Negative for congestion and hearing loss.   Eyes: Negative for discharge.  Respiratory: Negative for cough, sputum production and shortness of breath.   Cardiovascular: Negative for chest pain, palpitations and leg swelling.  Gastrointestinal: Negative for abdominal pain, blood in stool, constipation, diarrhea, heartburn, nausea and vomiting.  Genitourinary: Negative for dysuria, frequency, hematuria and  urgency.  Musculoskeletal: Negative for back pain, falls and myalgias.  Skin: Negative for rash.  Neurological: Negative for dizziness, sensory change, loss of consciousness, weakness and headaches.  Endo/Heme/Allergies: Negative for environmental allergies. Does not bruise/bleed easily.  Psychiatric/Behavioral: Negative for depression and suicidal ideas. The patient is not nervous/anxious and does not have insomnia.        Objective:    Physical Exam Constitutional:      General: She is not in acute distress.    Appearance: She is not diaphoretic.  HENT:     Head: Normocephalic and atraumatic.     Right Ear: External ear normal.     Left Ear: External ear normal.     Nose: Nose normal.     Mouth/Throat:     Pharynx: No oropharyngeal exudate.  Eyes:     General: No scleral icterus.       Right eye: No discharge.        Left eye: No discharge.     Conjunctiva/sclera: Conjunctivae normal.     Pupils: Pupils are equal, round, and reactive to light.  Neck:     Musculoskeletal: Normal range of motion and neck supple.     Thyroid: No thyromegaly.  Cardiovascular:     Rate and Rhythm: Normal rate and regular rhythm.     Heart sounds: Normal heart sounds. No murmur.  Pulmonary:     Effort: Pulmonary effort is normal. No respiratory distress.     Breath sounds: Normal breath sounds. No wheezing or rales.  Abdominal:     General: Bowel sounds are normal. There is no distension.     Palpations: Abdomen is soft. There is no mass.     Tenderness: There is no abdominal tenderness.  Musculoskeletal: Normal range of motion.        General: No tenderness.  Lymphadenopathy:     Cervical: No cervical adenopathy.  Skin:    General: Skin is warm and dry.     Findings: No rash.  Neurological:     Mental Status: She is alert and oriented to person, place, and time.     Cranial Nerves: No cranial nerve deficit.     Coordination: Coordination normal.     Deep Tendon Reflexes: Reflexes are  normal and symmetric. Reflexes normal.     BP 140/72 (BP Location: Left Arm, Patient Position: Sitting, Cuff Size: Normal)   Pulse 66   Temp 98.6 F (37 C) (Oral)   Resp 18   Ht 5\' 1"  (1.549 m)   Wt 185 lb (83.9 kg)   SpO2 94%   BMI 34.96 kg/m  Wt Readings from Last 3 Encounters:  07/01/18 185 lb (83.9 kg)  06/24/18 185 lb 6 oz (84.1 kg)  02/21/18 183 lb 6.4 oz (83.2 kg)     Lab Results  Component Value Date   WBC 7.2 07/01/2018   HGB 13.7 07/01/2018   HCT 41.1 07/01/2018   PLT 210.0 07/01/2018   GLUCOSE 97 07/01/2018   CHOL 185 07/01/2018   TRIG 97.0 07/01/2018   HDL 62.70 07/01/2018   LDLDIRECT 131.8 10/10/2008   LDLCALC 103 (H) 07/01/2018   ALT 16 07/01/2018   AST 18 07/01/2018   NA 140 07/01/2018   K 4.4 07/01/2018   CL 101 07/01/2018   CREATININE 0.86 07/01/2018   BUN 22 07/01/2018   CO2 31 07/01/2018   TSH 2.42 07/01/2018   INR 1.01 08/18/2017   HGBA1C 5.5 07/01/2018    Lab Results  Component Value Date   TSH 2.42 07/01/2018   Lab Results  Component Value Date   WBC 7.2 07/01/2018   HGB 13.7 07/01/2018   HCT 41.1 07/01/2018   MCV 95.1 07/01/2018   PLT 210.0  07/01/2018   Lab Results  Component Value Date   NA 140 07/01/2018   K 4.4 07/01/2018   CO2 31 07/01/2018   GLUCOSE 97 07/01/2018   BUN 22 07/01/2018   CREATININE 0.86 07/01/2018   BILITOT 0.6 07/01/2018   ALKPHOS 38 (L) 07/01/2018   AST 18 07/01/2018   ALT 16 07/01/2018   PROT 7.1 07/01/2018   ALBUMIN 4.6 07/01/2018   CALCIUM 10.4 07/01/2018   ANIONGAP 10 08/18/2017   GFR 65.23 07/01/2018   Lab Results  Component Value Date   CHOL 185 07/01/2018   Lab Results  Component Value Date   HDL 62.70 07/01/2018   Lab Results  Component Value Date   LDLCALC 103 (H) 07/01/2018   Lab Results  Component Value Date   TRIG 97.0 07/01/2018   Lab Results  Component Value Date   CHOLHDL 3 07/01/2018   Lab Results  Component Value Date   HGBA1C 5.5 07/01/2018         Assessment & Plan:   Problem List Items Addressed This Visit    GLUCOSE INTOLERANCE    hgba1c acceptable, minimize simple carbs. Increase exercise as tolerated. Continue current meds      Relevant Orders   Hemoglobin A1c (Completed)   Hyperlipidemia    Encouraged heart healthy diet, increase exercise, avoid trans fats, consider a krill oil cap daily      Relevant Medications   metoprolol tartrate (LOPRESSOR) 50 MG tablet   lovastatin (MEVACOR) 20 MG tablet   hydrochlorothiazide (MICROZIDE) 12.5 MG capsule   Other Relevant Orders   Lipid panel (Completed)   Essential hypertension, benign    Well controlled, no changes to meds. Encouraged heart healthy diet such as the DASH diet and exercise as tolerated.       Relevant Medications   metoprolol tartrate (LOPRESSOR) 50 MG tablet   lovastatin (MEVACOR) 20 MG tablet   hydrochlorothiazide (MICROZIDE) 12.5 MG capsule   Other Relevant Orders   CBC (Completed)   Comprehensive metabolic panel (Completed)   TSH (Completed)   Preventative health care    Patient encouraged to maintain heart healthy diet, regular exercise, adequate sleep. Consider daily probiotics. Take medications as prescribed      Personal history of colonic polyps    Following with Dr Bryan Lemma of [facility] now         I am having Liberty. Werst start on hyoscyamine. I am also having her maintain her aspirin, fish oil-omega-3 fatty acids, ibuprofen, oxymetazoline, Multiple Vitamins-Minerals (MULTIVITAMIN WOMEN PO), tretinoin, metoprolol tartrate, lovastatin, and hydrochlorothiazide.  Meds ordered this encounter  Medications  . DISCONTD: metoprolol tartrate (LOPRESSOR) 50 MG tablet    Sig: Take 1 tablet (50 mg total) by mouth 2 (two) times daily.    Dispense:  180 tablet    Refill:  3  . DISCONTD: lovastatin (MEVACOR) 20 MG tablet    Sig: Take 2 tablets (40 mg total) by mouth at bedtime.    Dispense:  180 tablet    Refill:  3  . DISCONTD:  hydrochlorothiazide (MICROZIDE) 12.5 MG capsule    Sig: Take 1 capsule (12.5 mg total) by mouth daily.    Dispense:  90 capsule    Refill:  3  . hyoscyamine (LEVSIN SL) 0.125 MG SL tablet    Sig: Place 1 tablet (0.125 mg total) under the tongue every 4 (four) hours as needed.    Dispense:  30 tablet    Refill:  0  . metoprolol tartrate (  LOPRESSOR) 50 MG tablet    Sig: Take 1 tablet (50 mg total) by mouth 2 (two) times daily.    Dispense:  180 tablet    Refill:  3  . lovastatin (MEVACOR) 20 MG tablet    Sig: Take 2 tablets (40 mg total) by mouth at bedtime.    Dispense:  180 tablet    Refill:  3  . hydrochlorothiazide (MICROZIDE) 12.5 MG capsule    Sig: Take 1 capsule (12.5 mg total) by mouth daily.    Dispense:  90 capsule    Refill:  3     Penni Homans, MD

## 2018-08-18 ENCOUNTER — Encounter: Payer: Medicare HMO | Admitting: Family Medicine

## 2018-08-30 ENCOUNTER — Encounter (HOSPITAL_COMMUNITY): Payer: Medicare HMO

## 2018-08-30 ENCOUNTER — Encounter: Payer: Medicare HMO | Admitting: Vascular Surgery

## 2018-08-31 ENCOUNTER — Encounter (HOSPITAL_COMMUNITY): Payer: Medicare HMO

## 2018-08-31 ENCOUNTER — Encounter: Payer: Medicare HMO | Admitting: Vascular Surgery

## 2018-10-18 ENCOUNTER — Encounter (HOSPITAL_COMMUNITY): Payer: Medicare HMO

## 2018-10-18 ENCOUNTER — Encounter: Payer: Medicare HMO | Admitting: Vascular Surgery

## 2018-11-04 ENCOUNTER — Telehealth: Payer: Self-pay | Admitting: Family Medicine

## 2018-11-04 DIAGNOSIS — I6522 Occlusion and stenosis of left carotid artery: Secondary | ICD-10-CM

## 2018-11-04 NOTE — Telephone Encounter (Signed)
Copied from [place] [id number]. Topic: General - Other >> Nov 04, 2018 12:26 PM Keene Breath wrote: Reason for CRM: [name] with [facility] called to request another referral for patient.  The last referral expired because it was from last March.  Please advise and call with any questions.  CB# [phone]

## 2018-11-09 NOTE — Telephone Encounter (Signed)
New referral sent The one that was sent in March does not expire until September 2020

## 2018-11-22 ENCOUNTER — Ambulatory Visit: Payer: Medicare HMO | Admitting: Vascular Surgery

## 2018-11-22 ENCOUNTER — Encounter: Payer: Self-pay | Admitting: Vascular Surgery

## 2018-11-22 ENCOUNTER — Ambulatory Visit (HOSPITAL_COMMUNITY)
Admission: RE | Admit: 2018-11-22 | Discharge: 2018-11-22 | Disposition: A | Discharge status: Home / Self Care | Payer: Medicare HMO | Source: Ambulatory Visit | Attending: Vascular Surgery | Admitting: Vascular Surgery

## 2018-11-22 ENCOUNTER — Other Ambulatory Visit: Payer: Self-pay

## 2018-11-22 DIAGNOSIS — I6522 Occlusion and stenosis of left carotid artery: Secondary | ICD-10-CM | POA: Diagnosis not present

## 2018-11-22 DIAGNOSIS — I779 Disorder of arteries and arterioles, unspecified: Secondary | ICD-10-CM | POA: Insufficient documentation

## 2018-11-22 NOTE — Progress Notes (Signed)
Patient name: [name] MRN: [id number] DOB: [birth year] Sex: female  REASON FOR VISIT: 1 year follow-up for carotid disease  HPI: [name] is a 69 y.o. female with history of hypertension hyperlipidemia and obesity who presents for one-year follow-up for ongoing carotid surveillance.  Patient was previously seen in early 2019 by Dr. Bridgett Larsson when she had right hand weakness and a suspected TIA with left cortical event.  At the time she underwent work-up including CTA neck and ultrasound of her carotids that showed less than 50% stenosis.  Ultimately medical management was recommended at the time by Dr. Bridgett Larsson.  On follow-up today she reports no additional events over the last year including weakness numbness tingling or vision loss.  States she quit smoking years ago.  She is retired but very functional.  Denies any head or neck surgery.  States her only issue is struggling with her weight.  She does take aspirin and statin.  Past Medical History:  Diagnosis Date  . Arthritis   . Benign paroxysmal positional vertigo 11/14/2013  . Colon polyp   . Depression   . Diverticulosis   . Essential hypertension, benign   . GERD (gastroesophageal reflux disease) 09/02/2013  . Glaucoma   . Glucose intolerance (impaired glucose tolerance)   . H/O tobacco use, presenting hazards to health 09/02/2013  . History  of basal cell carcinoma   . History of cardiac arrhythmia   . Hyperlipidemia   . Hypertension   . Medicare welcome visit 11/28/2012  . Obesity 02/08/2017  . Palpitations 02/04/2017  . Personal history of colonic polyps 09/02/2013   Patient reports H/O 2 colonoscopies, last one dates back to 2012 in [place] She reports they were labeled as incomplete.  Reports 1st colonoscopy had 1 benign polyp and the 2nd was clear.   . Sleep apnea 10/01/2016   Not on CPAP machine   . UTI (urinary tract infection)     Past Surgical History:  Procedure Laterality Date  . CERVICAL CONE BIOPSY  1960s  .  COLONOSCOPY  2016  . DILATION AND CURETTAGE OF UTERUS  1966  . KNEE SURGERY Left 11/01/2008   left knee total replacement Dr Sharol Given    Family History  Problem Relation Age of Onset  . Coronary artery disease Paternal Grandmother   . Hyperlipidemia Paternal Grandmother   . Hypertension Paternal Grandmother   . Stroke Paternal Grandmother   . Hypertension Mother   . Lung cancer Mother        smoker  . Uterine cancer Mother   . Birth defects Mother        lung cancer in smoker  . COPD Mother   . Hypertension Father   . Kidney disease Father        Renal Cell Carcinoma  . Birth defects Father        renal cell CA, smoker  . Hypertension Maternal Grandmother   . Hypertension Maternal Grandfather   . Hypertension Paternal Grandfather   . Dementia Paternal Grandfather   . Cancer Brother        testicular  . Heart disease Brother        NI, smoker  . Hyperlipidemia Other   . Colon cancer Neg Hx   . Esophageal cancer Neg Hx     SOCIAL HISTORY: Social History   Tobacco Use  . Smoking status: Former Smoker    Packs/day: 1.00    Years: 15.00    Pack years: 15.00  Types: Cigarettes    Quit date: 06/08/1982    Years since quitting: 36.4  . Smokeless tobacco: Never Used  Substance Use Topics  . Alcohol use: Yes    Comment: wine w/ dinner 'once in a while'    No Known Allergies  Current Outpatient Medications  Medication Sig Dispense Refill  . aspirin 81 MG tablet Take 81 mg by mouth daily.      . fish oil-omega-3 fatty acids 1000 MG capsule Take 1 g by mouth daily.     . hydrochlorothiazide (MICROZIDE) 12.5 MG capsule Take 1 capsule (12.5 mg total) by mouth daily. 90 capsule 3  . hyoscyamine (LEVSIN SL) 0.125 MG SL tablet Place 1 tablet (0.125 mg total) under the tongue every 4 (four) hours as needed. 30 tablet 0  . ibuprofen (ADVIL,MOTRIN) 200 MG tablet Take 400 mg by mouth every 6 (six) hours as needed for headache or moderate pain.     Marland Kitchen lovastatin (MEVACOR) 20 MG  tablet Take 2 tablets (40 mg total) by mouth at bedtime. 180 tablet 3  . metoprolol tartrate (LOPRESSOR) 50 MG tablet Take 1 tablet (50 mg total) by mouth 2 (two) times daily. 180 tablet 3  . Multiple Vitamins-Minerals (MULTIVITAMIN WOMEN PO) Take 1 tablet by mouth daily.    Marland Kitchen oxymetazoline (AFRIN) 0.05 % nasal spray Place 1 spray into both nostrils 2 (two) times daily as needed for congestion.    Marland Kitchen tretinoin (RETIN-A) 0.1 % cream APPLY  CREAM TOPICALLY TO AFFECTED AREA EVERY DAY AT BEDTIME ,PEA-SIZED  AMOUNT  TO  FACE 45 g 5   No current facility-administered medications for this visit.     REVIEW OF SYSTEMS:  [X]  denotes positive finding, [ ]  denotes negative finding Cardiac  Comments:  Chest pain or chest pressure:    Shortness of breath upon exertion:    Short of breath when lying flat:    Irregular heart rhythm:        Vascular    Pain in calf, thigh, or hip brought on by ambulation:    Pain in feet at night that wakes you up from your sleep:     Blood clot in your veins:    Leg swelling:         Pulmonary    Oxygen at home:    Productive cough:     Wheezing:         Neurologic    Sudden weakness in arms or legs:     Sudden numbness in arms or legs:     Sudden onset of difficulty speaking or slurred speech:    Temporary loss of vision in one eye:     Problems with dizziness:         Gastrointestinal    Blood in stool:     Vomited blood:         Genitourinary    Burning when urinating:     Blood in urine:        Psychiatric    Major depression:         Hematologic    Bleeding problems:    Problems with blood clotting too easily:        Skin    Rashes or ulcers:        Constitutional    Fever or chills:      PHYSICAL EXAM: Vitals:   11/22/18 1218 11/22/18 1222  BP: 137/68 (!) 149/76  Pulse: 60 62  Resp: 16   Temp: 98.2  F (36.8 C)   TempSrc: Temporal   SpO2: 95%   Weight: 185 lb (83.9 kg)   Height: 5\' 1"  (1.549 m)     GENERAL: The patient is a  well-nourished female, in no acute distress. The vital signs are documented above. CARDIAC: There is a regular rate and rhythm.  VASCULAR:  Bilateral carotid pulse No head and neck incisions 2+ palpable radial pulse bilateral upper extremiteis PULMONARY: There is good air exchange bilaterally without wheezing or rales. ABDOMEN: Soft and non-tender with normal pitched bowel sounds.  MUSCULOSKELETAL: There are no major deformities or cyanosis. NEUROLOGIC: No focal weakness or paresthesias are detected.  Cn II-XII grossly intact.   DATA:   I independently reviewed her carotid duplex which suggest a greater than 80% left ICA stenosis proximally with a velocity of 329/128.  Assessment/Plan:  68 year old female who was initially seen by Dr. Bridgett Larsson last year with a suspected TIA and left cortical event and right-sided hand weakness but at the time had less than 50% carotid stenosis and medical management was recommended.  On continued surveillance today her duplex suggest that her carotid disease has progressed to greater than 80% disease in the proximal left ICA with a velocity of 329/128.  I discussed in detail with her the recommendation for left carotid endarterectomy given greater than 80% disease.  I would assume this lesion is asymptomatic given no events over the last year.  We discussed the details of the operation.  Unfortunately the patient lives alone and has no family here.  She asked if her surgery could be performed at [facility] since she lives next to that hosptial and discussed that we only operate here at [facility].  She would like to think about her options and will call us to schedule.   Marty Heck, MD [facility] Office: [phone] Pager: [phone]

## 2018-11-24 ENCOUNTER — Telehealth: Payer: Self-pay | Admitting: Family Medicine

## 2018-11-24 NOTE — Telephone Encounter (Signed)
Patient is requesting a referral for  [facility] and [address] [place], [place]  [id number] Patient call back [phone]

## 2018-11-24 NOTE — Telephone Encounter (Signed)
Please advise 

## 2018-11-25 ENCOUNTER — Other Ambulatory Visit: Payer: Self-pay | Admitting: Family Medicine

## 2018-11-25 DIAGNOSIS — I6522 Occlusion and stenosis of left carotid artery: Secondary | ICD-10-CM

## 2018-11-25 NOTE — Telephone Encounter (Signed)
done

## 2018-11-28 NOTE — Telephone Encounter (Signed)
Patient called [facility] and office told patient the referral was not there.  The referral is clearly on her chart.  Patient states office told her they need referral sent to [facility].  Patient is a little confused and would a call back from a nurse to further assist in the referral.  Office gave patient these numbers for referral: Fax [phone] Phone [phone]  Patient call back # [phone]

## 2018-12-15 ENCOUNTER — Other Ambulatory Visit (HOSPITAL_BASED_OUTPATIENT_CLINIC_OR_DEPARTMENT_OTHER): Payer: Self-pay | Admitting: Family Medicine

## 2018-12-15 DIAGNOSIS — Z1231 Encounter for screening mammogram for malignant neoplasm of breast: Secondary | ICD-10-CM

## 2018-12-26 DIAGNOSIS — Z87891 Personal history of nicotine dependence: Secondary | ICD-10-CM | POA: Diagnosis not present

## 2018-12-26 DIAGNOSIS — I6523 Occlusion and stenosis of bilateral carotid arteries: Secondary | ICD-10-CM | POA: Diagnosis not present

## 2018-12-26 DIAGNOSIS — I6522 Occlusion and stenosis of left carotid artery: Secondary | ICD-10-CM | POA: Diagnosis not present

## 2018-12-27 ENCOUNTER — Other Ambulatory Visit: Payer: Self-pay

## 2018-12-27 ENCOUNTER — Encounter (HOSPITAL_BASED_OUTPATIENT_CLINIC_OR_DEPARTMENT_OTHER): Payer: Self-pay

## 2018-12-27 ENCOUNTER — Ambulatory Visit (HOSPITAL_BASED_OUTPATIENT_CLINIC_OR_DEPARTMENT_OTHER)
Admission: RE | Admit: 2018-12-27 | Discharge: 2018-12-27 | Disposition: A | Discharge status: Home / Self Care | Payer: Medicare HMO | Source: Ambulatory Visit | Attending: Family Medicine | Admitting: Family Medicine

## 2018-12-27 DIAGNOSIS — Z1231 Encounter for screening mammogram for malignant neoplasm of breast: Secondary | ICD-10-CM | POA: Insufficient documentation

## 2019-01-09 DIAGNOSIS — I700 Atherosclerosis of aorta: Secondary | ICD-10-CM | POA: Diagnosis not present

## 2019-01-09 DIAGNOSIS — I6522 Occlusion and stenosis of left carotid artery: Secondary | ICD-10-CM | POA: Diagnosis not present

## 2019-01-09 DIAGNOSIS — I6523 Occlusion and stenosis of bilateral carotid arteries: Secondary | ICD-10-CM | POA: Diagnosis not present

## 2019-01-17 ENCOUNTER — Encounter: Payer: Self-pay | Admitting: Family Medicine

## 2019-01-28 ENCOUNTER — Encounter: Payer: Self-pay | Admitting: Family Medicine

## 2019-01-30 ENCOUNTER — Encounter: Payer: Self-pay | Admitting: Internal Medicine

## 2019-01-30 ENCOUNTER — Ambulatory Visit (INDEPENDENT_AMBULATORY_CARE_PROVIDER_SITE_OTHER): Payer: Medicare HMO | Admitting: Internal Medicine

## 2019-01-30 ENCOUNTER — Other Ambulatory Visit: Payer: Self-pay

## 2019-01-30 VITALS — BP 139/52 | HR 63 | Temp 96.2°F | Resp 16 | Ht 61.0 in | Wt 188.1 lb

## 2019-01-30 DIAGNOSIS — L247 Irritant contact dermatitis due to plants, except food: Secondary | ICD-10-CM | POA: Diagnosis not present

## 2019-01-30 MED ORDER — PREDNISONE 10 MG PO TABS: ORAL_TABLET | ORAL | 0 refills | Status: DC

## 2019-01-30 MED ORDER — HYDROCORTISONE 2.5 % EX CREA: TOPICAL_CREAM | Freq: Two times a day (BID) | CUTANEOUS | 0 refills | Status: DC

## 2019-01-30 NOTE — Patient Instructions (Signed)
Use prednisone for few days as prescribed  Use hydrocortisone 2.5% twice a day as needed and the areas where you are itching  Use over-the-counter antihistaminic such as Benadryl  Clean all your clothing  Call if not gradually better   Poison Ivy Dermatitis Poison ivy dermatitis is inflammation of the skin that is caused by chemicals in the leaves of the poison ivy plant. The skin reaction often involves redness, swelling, blisters, and extreme itching. What are the causes? This condition is caused by a chemical (urushiol) found in the sap of the poison ivy plant. This chemical is sticky and can be easily spread to people, animals, and objects. You can get poison ivy dermatitis by:  Having direct contact with a poison ivy plant.  Touching animals, other people, or objects that have come in contact with poison ivy and have the chemical on them. What increases the risk? This condition is more likely to develop in people who:  Are outdoors often in wooded or [place] areas.  Go outdoors without wearing protective clothing, such as closed shoes, long pants, and a long-sleeved shirt. What are the signs or symptoms? Symptoms of this condition include:  Redness of the skin.  Extreme itching.  A rash that often includes bumps and blisters. The rash usually appears 48 hours after exposure, if you have been exposed before. If this is the first time you have been exposed, the rash may not appear until a week after exposure.  Swelling. This may occur if the reaction is more severe. Symptoms usually last for 1-2 weeks. However, the first time you develop this condition, symptoms may last 3-4 weeks. How is this diagnosed? This condition may be diagnosed based on your symptoms and a physical exam. Your health care provider may also ask you about any recent outdoor activity. How is this treated? Treatment for this condition will vary depending on how severe it is. Treatment may include:   Hydrocortisone cream or calamine lotion to relieve itching.  Oatmeal baths to soothe the skin.  Medicines, such as over-the-counter antihistamine tablets.  Oral steroid medicine, for more severe reactions. Follow these instructions at home: Medicines  Take or apply over-the-counter and prescription medicines only as told by your health care provider.  Use hydrocortisone cream or calamine lotion as needed to soothe the skin and relieve itching. General instructions  Do not scratch or rub your skin.  Apply a cold, wet cloth (cold compress) to the affected areas or take baths in cool water. This will help with itching. Avoid hot baths and showers.  Take oatmeal baths as needed. Use colloidal oatmeal. You can get this at your local pharmacy or grocery store. Follow the instructions on the packaging.  While you have the rash, wash clothes right after you wear them.  Keep all follow-up visits as told by your health care provider. This is important. How is this prevented?   Learn to identify the poison ivy plant and avoid contact with the plant. This plant can be recognized by the number of leaves. Generally, poison ivy has three leaves with flowering branches on a single stem. The leaves are typically glossy, and they have jagged edges that come to a point at the front.  If you have been exposed to poison ivy, thoroughly wash with soap and water right away. You have about 30 minutes to remove the plant resin before it will cause the rash. Be sure to wash under your fingernails, because any plant resin there will continue  to spread the rash.  When hiking or camping, wear clothes that will help you to avoid exposure on the skin. This includes long pants, a long-sleeved shirt, tall socks, and hiking boots. You can also apply preventive lotion to your skin to help limit exposure.  If you suspect that your clothes or outdoor gear came in contact with poison ivy, rinse them off outside with a  garden hose before you bring them inside your house.  When doing yard work or gardening, wear gloves, long sleeves, long pants, and boots. Wash your garden tools and gloves if they come in contact with poison ivy.  If you suspect that your pet has come into contact with poison ivy, wash him or her with pet shampoo and water. Make sure to wear gloves while washing your pet. Contact a health care provider if you have:  Open sores in the rash area.  More redness, swelling, or pain in the affected area.  Redness that spreads beyond the rash area.  Fluid, blood, or pus coming from the affected area.  A fever.  A rash over a large area of your body.  A rash on your eyes, mouth, or genitals.  A rash that does not improve after a few weeks. Get help right away if:  Your face swells or your eyes swell shut.  You have trouble breathing.  You have trouble swallowing. These symptoms may represent a serious problem that is an emergency. Do not wait to see if the symptoms will go away. Get medical help right away. Call your local emergency services (911 in the [place] Do not drive yourself to the hospital. Summary  Poison ivy dermatitis is inflammation of the skin that is caused by chemicals in the leaves of the poison ivy plant.  Symptoms of this condition include redness, itching, a rash, and swelling.  Do not scratch or rub your skin.  Take or apply over-the-counter and prescription medicines only as told by your health care provider. This information is not intended to replace advice given to you by your health care provider. Make sure you discuss any questions you have with your health care provider. Document Released: 05/22/2000 Document Revised: 09/16/2018 Document Reviewed: 05/20/2018 Elsevier Patient Education  2020 [employer].

## 2019-01-30 NOTE — Progress Notes (Signed)
Subjective:    Patient ID: [name], female    DOB: Feb 20, 1951, 68 y.o.   MRN: [id number]  DOS:  01/30/2019 Type of visit - description: Acute Symptoms started approximately 12 days ago after she cleared the yard from   weeds.  Rash located at the legs and arms. Has used OTC cortisone, calamine, alcohol, vinegar and is taking an antihistaminic OTC. Still itching; sometimes she is awake but they are itching at nighttime    Review of Systems  Denies fever chills No unusual aches or pains Past Medical History:  Diagnosis Date  . Arthritis   . Benign paroxysmal positional vertigo 11/14/2013  . Colon polyp   . Depression   . Diverticulosis   . Essential hypertension, benign   . GERD (gastroesophageal reflux disease) 09/02/2013  . Glaucoma   . Glucose intolerance (impaired glucose tolerance)   . H/O tobacco use, presenting hazards to health 09/02/2013  . History  of basal cell carcinoma   . History of cardiac arrhythmia   . Hyperlipidemia   . Hypertension   . Medicare welcome visit 11/28/2012  . Obesity 02/08/2017  . Palpitations 02/04/2017  . Personal history of colonic polyps 09/02/2013   Patient reports H/O 2 colonoscopies, last one dates back to 2012 in [place] She reports they were labeled as incomplete.  Reports 1st colonoscopy had 1 benign polyp and the 2nd was clear.   . Sleep apnea 10/01/2016   Not on CPAP machine   . UTI (urinary tract infection)     Past Surgical History:  Procedure Laterality Date  . CERVICAL CONE BIOPSY  1960s  . COLONOSCOPY  2016  . DILATION AND CURETTAGE OF UTERUS  1966  . KNEE SURGERY Left 11/01/2008   left knee total replacement Dr Sharol Given    Social History   Socioeconomic History  . Marital status: Divorced    Spouse name: Not on file  . Number of children: Not on file  . Years of education: Not on file  . Highest education level: Not on file  Occupational History  . Occupation: Retired   Scientific laboratory technician  . Financial resource strain: Not  on file  . Food insecurity    Worry: Not on file    Inability: Not on file  . Transportation needs    Medical: Not on file    Non-medical: Not on file  Tobacco Use  . Smoking status: Former Smoker    Packs/day: 1.00    Years: 15.00    Pack years: 15.00    Types: Cigarettes    Quit date: 06/08/1982    Years since quitting: 36.6  . Smokeless tobacco: Never Used  Substance and Sexual Activity  . Alcohol use: Yes    Comment: wine w/ dinner 'once in a while'  . Drug use: No  . Sexual activity: Not Currently    Comment: lives alone, no dietary restrictions except no lactose,  Lifestyle  . Physical activity    Days per week: Not on file    Minutes per session: Not on file  . Stress: Not on file  Relationships  . Social Herbalist on phone: Not on file    Gets together: Not on file    Attends religious service: Not on file    Active member of club or organization: Not on file    Attends meetings of clubs or organizations: Not on file    Relationship status: Not on file  . Intimate partner violence  Fear of current or ex partner: Not on file    Emotionally abused: Not on file    Physically abused: Not on file    Forced sexual activity: Not on file  Other Topics Concern  . Not on file  Social History Narrative   Occupation: unemployed Product manager- now retired   Divorced   no children    Moved from [place] alone with 4 cats   Enjoys gardening, planting.                Allergies as of 01/30/2019   No Known Allergies     Medication List       Accurate as of January 30, 2019 11:59 PM. If you have any questions, ask your nurse or doctor.        aspirin 81 MG tablet Take 81 mg by mouth daily.   fish oil-omega-3 fatty acids 1000 MG capsule Take 1 g by mouth daily.   hydrochlorothiazide 12.5 MG capsule Commonly known as: MICROZIDE Take 1 capsule (12.5 mg total) by mouth daily.   hydrocortisone 2.5 % cream Apply topically 2 (two)  times daily. Started by: Kathlene November, MD   hyoscyamine 0.125 MG SL tablet Commonly known as: LEVSIN SL Place 1 tablet (0.125 mg total) under the tongue every 4 (four) hours as needed.   ibuprofen 200 MG tablet Commonly known as: ADVIL Take 400 mg by mouth every 6 (six) hours as needed for headache or moderate pain.   lovastatin 20 MG tablet Commonly known as: MEVACOR Take 2 tablets (40 mg total) by mouth at bedtime.   metoprolol tartrate 50 MG tablet Commonly known as: LOPRESSOR Take 1 tablet (50 mg total) by mouth 2 (two) times daily.   MULTIVITAMIN WOMEN PO Take 1 tablet by mouth daily.   oxymetazoline 0.05 % nasal spray Commonly known as: AFRIN Place 1 spray into both nostrils 2 (two) times daily as needed for congestion.   predniSONE 10 MG tablet Commonly known as: DELTASONE 4 tablets x 2 days, 3 tabs x 2 days, 2 tabs x 2 days, 1 tab x 2 days Started by: Kathlene November, MD   tretinoin 0.1 % cream Commonly known as: RETIN-A APPLY  CREAM TOPICALLY TO AFFECTED AREA EVERY DAY AT BEDTIME ,PEA-SIZED  AMOUNT  TO  FACE           Objective:   Physical Exam Skin:        BP (!) 139/52 (BP Location: Left Arm, Patient Position: Sitting, Cuff Size: Small)   Pulse 63   Temp (!) 96.2 F (35.7 C) (Temporal)   Resp 16   Ht 5\' 1"  (1.549 m)   Wt 188 lb 2 oz (85.3 kg)   SpO2 96%   BMI 35.55 kg/m   General:   Well developed, NAD, BMI noted. HEENT:  Normocephalic . Face symmetric, atraumatic Neurologic:  alert & oriented X3.  Speech normal, gait appropriate for age and unassisted Skin: See graphic, multiple maculopapular, dry, erythematous lesions at the L>R leg > L>R arn.  Some with evidence of scratching Psych--  Cognition and judgment appear intact.  Cooperative with normal attention span and concentration.  Behavior appropriate. No anxious or depressed appearing.      Assessment    68 year old female, PMH of HTN, carotid disease, no diabetes, high cholesterol,  presents with:  Rash: As described above, likely poison ivy, recommend a round of prednisone, continue with topical steroids but stronger strength, hydrocortisone 2.5%.  Continue  OTC antihistaminics, wash clothing and furniture. Call if not gradually better

## 2019-01-30 NOTE — Progress Notes (Signed)
Pre visit review using our clinic review tool, if applicable. No additional management support is needed unless otherwise documented below in the visit note. 

## 2019-02-09 DIAGNOSIS — R69 Illness, unspecified: Secondary | ICD-10-CM | POA: Diagnosis not present

## 2019-02-22 NOTE — Progress Notes (Signed)
Virtual Visit via Video Note  I connected with patient on 02/23/19 at 10:00 AM EDT by audio enabled telemedicine application and verified that I am speaking with the correct person using two identifiers.   THIS ENCOUNTER IS A VIRTUAL VISIT DUE TO COVID-19 - PATIENT WAS NOT SEEN IN THE OFFICE. PATIENT HAS CONSENTED TO VIRTUAL VISIT / TELEMEDICINE VISIT   Location of patient: home  Location of provider: office  I discussed the limitations of evaluation and management by telemedicine and the availability of in person appointments. The patient expressed understanding and agreed to proceed.   Subjective:   [name] is a 68 y.o. female who presents for Medicare Annual (Subsequent) preventive examination.  Review of Systems:  Home Safety/Smoke Alarms: Feels safe in home. Smoke alarms in place.  Lives alone in 1 story home. Has 3 cats.  Female:       Mammo- 12/28/18      Dexa scan-  02/23/18      CCS- past due as of 03/12/18.      Objective:     Vitals: BP 137/88   Pulse 66   Temp (!) 97 F (36.1 C) (Oral)   Wt 184 lb (83.5 kg)   BMI 34.77 kg/m   Body mass index is 34.77 kg/m.  Advanced Directives 02/23/2019 02/21/2018 08/18/2017 08/18/2017 02/04/2016 02/27/2015 12/25/2014  Does Patient Have a Medical Advance Directive? Yes Yes Yes No Yes Yes Yes  Type of Advance Directive Living will [place];Living will Living will;Healthcare Power of [place];Living will [place];Living will -  Does patient want to make changes to medical advance directive? No - Patient declined - No - Patient declined - - - No - Patient declined  Copy of [place] in Chart? - No - copy requested No - copy requested - No - copy requested - No - copy requested  Would patient like information on creating a medical advance directive? - - No - Patient declined No - Patient declined - - -    Tobacco Social History   Tobacco  Use  Smoking Status Former Smoker  . Packs/day: 1.00  . Years: 15.00  . Pack years: 15.00  . Types: Cigarettes  . Quit date: 06/08/1982  . Years since quitting: 36.7  Smokeless Tobacco Never Used     Counseling given: Not Answered   Clinical Intake:     Pain : No/denies pain                 Past Medical History:  Diagnosis Date  . Arthritis   . Benign paroxysmal positional vertigo 11/14/2013  . Colon polyp   . Depression   . Diverticulosis   . Essential hypertension, benign   . GERD (gastroesophageal reflux disease) 09/02/2013  . Glaucoma   . Glucose intolerance (impaired glucose tolerance)   . H/O tobacco use, presenting hazards to health 09/02/2013  . History  of basal cell carcinoma   . History of cardiac arrhythmia   . Hyperlipidemia   . Hypertension   . Medicare welcome visit 11/28/2012  . Obesity 02/08/2017  . Palpitations 02/04/2017  . Personal history of colonic polyps 09/02/2013   Patient reports H/O 2 colonoscopies, last one dates back to 2012 in [place] She reports they were labeled as incomplete.  Reports 1st colonoscopy had 1 benign polyp and the 2nd was clear.   . Sleep apnea 10/01/2016   Not on CPAP machine   .  UTI (urinary tract infection)    Past Surgical History:  Procedure Laterality Date  . CERVICAL CONE BIOPSY  1960s  . COLONOSCOPY  2016  . DILATION AND CURETTAGE OF UTERUS  1966  . KNEE SURGERY Left 11/01/2008   left knee total replacement Dr Sharol Given   Family History  Problem Relation Age of Onset  . Coronary artery disease Paternal Grandmother   . Hyperlipidemia Paternal Grandmother   . Hypertension Paternal Grandmother   . Stroke Paternal Grandmother   . Hypertension Mother   . Lung cancer Mother        smoker  . Uterine cancer Mother   . Birth defects Mother        lung cancer in smoker  . COPD Mother   . Hypertension Father   . Kidney disease Father        Renal Cell Carcinoma  . Birth defects Father        renal cell CA, smoker  .  Hypertension Maternal Grandmother   . Hypertension Maternal Grandfather   . Hypertension Paternal Grandfather   . Dementia Paternal Grandfather   . Cancer Brother        testicular  . Heart disease Brother        NI, smoker  . Hyperlipidemia Other   . Colon cancer Neg Hx   . Esophageal cancer Neg Hx    Social History   Socioeconomic History  . Marital status: Divorced    Spouse name: Not on file  . Number of children: Not on file  . Years of education: Not on file  . Highest education level: Not on file  Occupational History  . Occupation: Retired   Scientific laboratory technician  . Financial resource strain: Not on file  . Food insecurity    Worry: Not on file    Inability: Not on file  . Transportation needs    Medical: Not on file    Non-medical: Not on file  Tobacco Use  . Smoking status: Former Smoker    Packs/day: 1.00    Years: 15.00    Pack years: 15.00    Types: Cigarettes    Quit date: 06/08/1982    Years since quitting: 36.7  . Smokeless tobacco: Never Used  Substance and Sexual Activity  . Alcohol use: Yes    Comment: wine w/ dinner 'once in a while'  . Drug use: No  . Sexual activity: Not Currently    Comment: lives alone, no dietary restrictions except no lactose,  Lifestyle  . Physical activity    Days per week: Not on file    Minutes per session: Not on file  . Stress: Not on file  Relationships  . Social Herbalist on phone: Not on file    Gets together: Not on file    Attends religious service: Not on file    Active member of club or organization: Not on file    Attends meetings of clubs or organizations: Not on file    Relationship status: Not on file  Other Topics Concern  . Not on file  Social History Narrative   Occupation: unemployed Product manager- now retired   Divorced   no children    Moved from [place] alone with 4 cats   Enjoys gardening, planting.              Outpatient Encounter Medications as of 02/23/2019   Medication Sig  . aspirin 81 MG tablet Take  81 mg by mouth daily.    . fish oil-omega-3 fatty acids 1000 MG capsule Take 1 g by mouth daily.   . hydrochlorothiazide (MICROZIDE) 12.5 MG capsule Take 1 capsule (12.5 mg total) by mouth daily.  . hydrocortisone 2.5 % cream Apply topically 2 (two) times daily.  Marland Kitchen ibuprofen (ADVIL,MOTRIN) 200 MG tablet Take 400 mg by mouth every 6 (six) hours as needed for headache or moderate pain.   Marland Kitchen lovastatin (MEVACOR) 20 MG tablet Take 2 tablets (40 mg total) by mouth at bedtime.  . metoprolol tartrate (LOPRESSOR) 50 MG tablet Take 1 tablet (50 mg total) by mouth 2 (two) times daily.  . Multiple Vitamins-Minerals (MULTIVITAMIN WOMEN PO) Take 1 tablet by mouth daily.  Marland Kitchen tretinoin (RETIN-A) 0.1 % cream APPLY  CREAM TOPICALLY TO AFFECTED AREA EVERY DAY AT BEDTIME ,PEA-SIZED  AMOUNT  TO  FACE  . [DISCONTINUED] hyoscyamine (LEVSIN SL) 0.125 MG SL tablet Place 1 tablet (0.125 mg total) under the tongue every 4 (four) hours as needed. (Patient not taking: Reported on 01/30/2019)  . [DISCONTINUED] oxymetazoline (AFRIN) 0.05 % nasal spray Place 1 spray into both nostrils 2 (two) times daily as needed for congestion.  . [DISCONTINUED] predniSONE (DELTASONE) 10 MG tablet 4 tablets x 2 days, 3 tabs x 2 days, 2 tabs x 2 days, 1 tab x 2 days   No facility-administered encounter medications on file as of 02/23/2019.     Activities of Daily Living No flowsheet data found.  Patient Care Team: Mosie Lukes, MD as PCP - General (Family Medicine) Rolm Bookbinder, MD as Consulting Physician (Dermatology) Emily Filbert, MD as Consulting Physician (Obstetrics and Gynecology)    Assessment:   This is a routine wellness examination for [facility]. Physical assessment deferred to PCP.  Exercise Activities and Dietary recommendations   Diet (meal preparation, eat out, water intake, caffeinated beverages, dairy products, fruits and vegetables): in general, a "healthy" diet  , well  balanced      Goals    . Reduce calorie intake to 2000 calories per day       Fall Risk Fall Risk  02/23/2019 02/21/2018 02/04/2017 02/04/2016 12/25/2014  Falls in the past year? 0 Yes No Yes No  Number falls in past yr: - 1 - 1 -  Comment - - - Got up out of booth and R knee gave out. Fell and scratched eye. -  Injury with Fall? - - - No -  Follow up - Falls prevention discussed;Education provided - - -    Depression Screen PHQ 2/9 Scores 02/23/2019 02/21/2018 02/04/2017 02/04/2016  PHQ - 2 Score 0 0 4 0  PHQ- 9 Score - - 9 -     Cognitive Function Ad8 score reviewed for issues:  Issues making decisions:no  Less interest in hobbies / activities:no  Repeats questions, stories (family complaining):no  Trouble using ordinary gadgets (microwave, computer, phone):no  Forgets the month or year: no  Mismanaging finances: no  Remembering appts:no  Daily problems with thinking and/or memory:no Ad8 score is=0     MMSE - Mini Mental State Exam 02/04/2016  Orientation to time 5  Orientation to Place 5  Registration 3  Attention/ Calculation 5  Recall 3  Language- name 2 objects 2  Language- repeat 1  Language- follow 3 step command 3  Language- read & follow direction 1  Write a sentence 1  Copy design 1  Total score 30        Immunization History  Administered Date(s) Administered  . Influenza-Unspecified 03/02/2018  . Pneumococcal Conjugate-13 08/28/2013  . Pneumococcal Polysaccharide-23 10/16/2008, 12/25/2014  . Tdap 11/28/2012  . Zoster 12/02/2012   Screening Tests Health Maintenance  Topic Date Due  . COLONOSCOPY  03/12/2018  . INFLUENZA VACCINE  03/09/2019 (Originally 01/07/2019)  . MAMMOGRAM  12/26/2020  . TETANUS/TDAP  11/29/2022  . DEXA SCAN  Completed  . Hepatitis C Screening  Completed  . PNA vac Low Risk Adult  Completed      Plan:   See you next year!  Continue to eat heart healthy diet (full of fruits, vegetables, whole grains, lean  protein, water--limit salt, fat, and sugar intake) and increase physical activity as tolerated.  Continue doing brain stimulating activities (puzzles, reading, adult coloring books, staying active) to keep memory sharp.    I have personally reviewed and noted the following in the patient's chart:   . Medical and social history . Use of alcohol, tobacco or illicit drugs  . Current medications and supplements . Functional ability and status . Nutritional status . Physical activity . Advanced directives . List of other physicians . Hospitalizations, surgeries, and ER visits in previous 12 months . Vitals . Screenings to include cognitive, depression, and falls . Referrals and appointments  In addition, I have reviewed and discussed with patient certain preventive protocols, quality metrics, and best practice recommendations. A written personalized care plan for preventive services as well as general preventive health recommendations were provided to patient.     Shela Nevin, [place]  02/23/2019

## 2019-02-23 ENCOUNTER — Ambulatory Visit (INDEPENDENT_AMBULATORY_CARE_PROVIDER_SITE_OTHER): Payer: Medicare HMO | Admitting: *Deleted

## 2019-02-23 ENCOUNTER — Encounter: Payer: Self-pay | Admitting: *Deleted

## 2019-02-23 ENCOUNTER — Other Ambulatory Visit: Payer: Self-pay

## 2019-02-23 VITALS — BP 137/88 | HR 66 | Temp 97.0°F | Wt 184.0 lb

## 2019-02-23 DIAGNOSIS — Z0000 Encounter for general adult medical examination without abnormal findings: Secondary | ICD-10-CM

## 2019-02-23 NOTE — Patient Instructions (Signed)
See you next year!  Continue to eat heart healthy diet (full of fruits, vegetables, whole grains, lean protein, water--limit salt, fat, and sugar intake) and increase physical activity as tolerated.  Continue doing brain stimulating activities (puzzles, reading, adult coloring books, staying active) to keep memory sharp.    [name] , Thank you for taking time to come for your Medicare Wellness Visit. I appreciate your ongoing commitment to your health goals. Please review the following plan we discussed and let me know if I can assist you in the future.   These are the goals we discussed: Goals    . Reduce calorie intake to 2000 calories per day       This is a list of the screening recommended for you and due dates:  Health Maintenance  Topic Date Due  . Colon Cancer Screening  03/12/2018  . Flu Shot  03/09/2019*  . Mammogram  12/26/2020  . Tetanus Vaccine  11/29/2022  . DEXA scan (bone density measurement)  Completed  .  Hepatitis C: One time screening is recommended by Center for Disease Control  (CDC) for  adults born from [birth year] through 1967.   Completed  . Pneumonia vaccines  Completed  *Topic was postponed. The date shown is not the original due date.    Health Maintenance After Age 80 After age 80, you are at a higher risk for certain long-term diseases and infections as well as injuries from falls. Falls are a major cause of broken bones and head injuries in people who are older than age 11. Getting regular preventive care can help to keep you healthy and well. Preventive care includes getting regular testing and making lifestyle changes as recommended by your health care provider. Talk with your health care provider about:  Which screenings and tests you should have. A screening is a test that checks for a disease when you have no symptoms.  A diet and exercise plan that is right for you. What should I know about screenings and tests to prevent falls? Screening and  testing are the best ways to find a health problem early. Early diagnosis and treatment give you the best chance of managing medical conditions that are common after age 75. Certain conditions and lifestyle choices may make you more likely to have a fall. Your health care provider may recommend:  Regular vision checks. Poor vision and conditions such as cataracts can make you more likely to have a fall. If you wear glasses, make sure to get your prescription updated if your vision changes.  Medicine review. Work with your health care provider to regularly review all of the medicines you are taking, including over-the-counter medicines. Ask your health care provider about any side effects that may make you more likely to have a fall. Tell your health care provider if any medicines that you take make you feel dizzy or sleepy.  Osteoporosis screening. Osteoporosis is a condition that causes the bones to get weaker. This can make the bones weak and cause them to break more easily.  Blood pressure screening. Blood pressure changes and medicines to control blood pressure can make you feel dizzy.  Strength and balance checks. Your health care provider may recommend certain tests to check your strength and balance while standing, walking, or changing positions.  Foot health exam. Foot pain and numbness, as well as not wearing proper footwear, can make you more likely to have a fall.  Depression screening. You may be more likely to have  a fall if you have a fear of falling, feel emotionally low, or feel unable to do activities that you used to do.  Alcohol use screening. Using too much alcohol can affect your balance and may make you more likely to have a fall. What actions can I take to lower my risk of falls? General instructions  Talk with your health care provider about your risks for falling. Tell your health care provider if: ? You fall. Be sure to tell your health care provider about all falls,  even ones that seem minor. ? You feel dizzy, sleepy, or off-balance.  Take over-the-counter and prescription medicines only as told by your health care provider. These include any supplements.  Eat a healthy diet and maintain a healthy weight. A healthy diet includes low-fat dairy products, low-fat (lean) meats, and fiber from whole grains, beans, and lots of fruits and vegetables. Home safety  Remove any tripping hazards, such as rugs, cords, and clutter.  Install safety equipment such as grab bars in bathrooms and safety rails on stairs.  Keep rooms and walkways well-lit. Activity   Follow a regular exercise program to stay fit. This will help you maintain your balance. Ask your health care provider what types of exercise are appropriate for you.  If you need a cane or walker, use it as recommended by your health care provider.  Wear supportive shoes that have nonskid soles. Lifestyle  Do not drink alcohol if your health care provider tells you not to drink.  If you drink alcohol, limit how much you have: ? 0-1 drink a day for women. ? 0-2 drinks a day for men.  Be aware of how much alcohol is in your drink. In the [place] one drink equals one typical bottle of beer (12 oz), one-half glass of wine (5 oz), or one shot of hard liquor (1 oz).  Do not use any products that contain nicotine or tobacco, such as cigarettes and e-cigarettes. If you need help quitting, ask your health care provider. Summary  Having a healthy lifestyle and getting preventive care can help to protect your health and wellness after age 73.  Screening and testing are the best way to find a health problem early and help you avoid having a fall. Early diagnosis and treatment give you the best chance for managing medical conditions that are more common for people who are older than age 64.  Falls are a major cause of broken bones and head injuries in people who are older than age 43. Take precautions to  prevent a fall at home.  Work with your health care provider to learn what changes you can make to improve your health and wellness and to prevent falls. This information is not intended to replace advice given to you by your health care provider. Make sure you discuss any questions you have with your health care provider. Document Released: 04/07/2017 Document Revised: 09/15/2018 Document Reviewed: 04/07/2017 Elsevier Patient Education  2020 [employer].

## 2019-02-27 DIAGNOSIS — R69 Illness, unspecified: Secondary | ICD-10-CM | POA: Diagnosis not present

## 2019-03-11 ENCOUNTER — Encounter: Payer: Self-pay | Admitting: Family Medicine

## 2019-04-10 ENCOUNTER — Other Ambulatory Visit: Payer: Self-pay | Admitting: Family Medicine

## 2019-04-10 DIAGNOSIS — E785 Hyperlipidemia, unspecified: Secondary | ICD-10-CM

## 2019-04-10 DIAGNOSIS — I10 Essential (primary) hypertension: Secondary | ICD-10-CM

## 2019-04-10 DIAGNOSIS — E739 Lactose intolerance, unspecified: Secondary | ICD-10-CM

## 2019-06-06 ENCOUNTER — Encounter: Payer: Self-pay | Admitting: Family Medicine

## 2019-06-20 DIAGNOSIS — R69 Illness, unspecified: Secondary | ICD-10-CM | POA: Diagnosis not present

## 2019-06-26 DIAGNOSIS — H524 Presbyopia: Secondary | ICD-10-CM | POA: Diagnosis not present

## 2019-06-29 ENCOUNTER — Encounter: Payer: Self-pay | Admitting: Family Medicine

## 2019-06-30 ENCOUNTER — Other Ambulatory Visit: Payer: Self-pay

## 2019-07-04 ENCOUNTER — Encounter: Payer: Medicare HMO | Admitting: Family Medicine

## 2019-07-10 ENCOUNTER — Other Ambulatory Visit: Payer: Self-pay | Admitting: Family Medicine

## 2019-07-25 ENCOUNTER — Other Ambulatory Visit: Payer: Self-pay

## 2019-07-25 ENCOUNTER — Encounter: Payer: Self-pay | Admitting: Family Medicine

## 2019-07-25 ENCOUNTER — Ambulatory Visit (INDEPENDENT_AMBULATORY_CARE_PROVIDER_SITE_OTHER): Payer: Medicare HMO | Admitting: Family Medicine

## 2019-07-25 VITALS — BP 122/82 | HR 61 | Temp 98.6°F | Resp 16 | Ht 61.0 in | Wt 187.0 lb

## 2019-07-25 DIAGNOSIS — Z0000 Encounter for general adult medical examination without abnormal findings: Secondary | ICD-10-CM | POA: Diagnosis not present

## 2019-07-25 DIAGNOSIS — I10 Essential (primary) hypertension: Secondary | ICD-10-CM | POA: Diagnosis not present

## 2019-07-25 DIAGNOSIS — K219 Gastro-esophageal reflux disease without esophagitis: Secondary | ICD-10-CM

## 2019-07-25 DIAGNOSIS — E739 Lactose intolerance, unspecified: Secondary | ICD-10-CM

## 2019-07-25 DIAGNOSIS — K5900 Constipation, unspecified: Secondary | ICD-10-CM | POA: Diagnosis not present

## 2019-07-25 DIAGNOSIS — E669 Obesity, unspecified: Secondary | ICD-10-CM | POA: Diagnosis not present

## 2019-07-25 DIAGNOSIS — K635 Polyp of colon: Secondary | ICD-10-CM

## 2019-07-25 DIAGNOSIS — Z780 Asymptomatic menopausal state: Secondary | ICD-10-CM

## 2019-07-25 DIAGNOSIS — E785 Hyperlipidemia, unspecified: Secondary | ICD-10-CM

## 2019-07-25 DIAGNOSIS — R7302 Impaired glucose tolerance (oral): Secondary | ICD-10-CM | POA: Diagnosis not present

## 2019-07-25 LAB — LIPID PANEL
Cholesterol: 174 mg/dL (ref 0–200)
HDL: 52.9 mg/dL (ref >39.00)
LDL Cholesterol: 97 mg/dL (ref 0–99)
NonHDL: 121.18
Total CHOL/HDL Ratio: 3
Triglycerides: 122 mg/dL (ref 0.0–149.0)
VLDL: 24.4 mg/dL (ref 0.0–40.0)

## 2019-07-25 LAB — CBC
HCT: 40.3 % (ref 36.0–46.0)
Hemoglobin: 13.4 g/dL (ref 12.0–15.0)
MCHC: 33.3 g/dL (ref 30.0–36.0)
MCV: 95.9 fl (ref 78.0–100.0)
Platelets: 239 10*3/uL (ref 150.0–400.0)
RBC: 4.21 Mil/uL (ref 3.87–5.11)
RDW: 13.1 % (ref 11.5–15.5)
WBC: 6.4 10*3/uL (ref 4.0–10.5)

## 2019-07-25 LAB — COMPREHENSIVE METABOLIC PANEL
ALT: 13 U/L (ref 0–35)
AST: 16 U/L (ref 0–37)
Albumin: 4.6 g/dL (ref 3.5–5.2)
Alkaline Phosphatase: 41 U/L (ref 39–117)
BUN: 24 mg/dL — ABNORMAL HIGH (ref 6–23)
CO2: 31 mEq/L (ref 19–32)
Calcium: 10.2 mg/dL (ref 8.4–10.5)
Chloride: 100 mEq/L (ref 96–112)
Creatinine, Ser: 0.92 mg/dL (ref 0.40–1.20)
GFR: 60.16 mL/min (ref >60.00)
Glucose, Bld: 96 mg/dL (ref 70–99)
Potassium: 4.2 mEq/L (ref 3.5–5.1)
Sodium: 138 mEq/L (ref 135–145)
Total Bilirubin: 0.5 mg/dL (ref 0.2–1.2)
Total Protein: 7.3 g/dL (ref 6.0–8.3)

## 2019-07-25 LAB — HEMOGLOBIN A1C: Hgb A1c MFr Bld: 6.1 % (ref 4.6–6.5)

## 2019-07-25 LAB — TSH: TSH: 2.37 u[IU]/mL (ref 0.35–4.50)

## 2019-07-25 NOTE — Assessment & Plan Note (Signed)
She has an appt with gastroenterology in [facility] in march for repeat colonoscopy due to transportation issues.

## 2019-07-25 NOTE — Progress Notes (Signed)
Subjective:    Patient ID: [name], female    DOB: [birth year], 69 y.o.   MRN: [id number]  Chief Complaint  Patient presents with  . Annual Exam    Doing well    HPI Patient is in today for annual preventative exam. She is doing well. No recent febrile illness or hospitalizations. She is staying home mostly during the pandemic and maintaining quarantine. Trying to eat well but not exercising regularly. Has been noting some mild constipation and she did have one episode of nausea or vomiting. Has not recurred. Denies CP/palp/SOB/HA/congestion/fevers or GU c/o. Taking meds as prescribed  Past Medical History:  Diagnosis Date  . Arthritis   . Benign paroxysmal positional vertigo 11/14/2013  . Colon polyp   . Depression   . Diverticulosis   . Essential hypertension, benign   . GERD (gastroesophageal reflux disease) 09/02/2013  . Glaucoma   . Glucose intolerance (impaired glucose tolerance)   . H/O tobacco use, presenting hazards to health 09/02/2013  . History  of basal cell carcinoma   . History of cardiac arrhythmia   . Hyperlipidemia   . Hypertension   . Medicare welcome visit 11/28/2012  . Obesity 02/08/2017  . Palpitations 02/04/2017  . Personal history of colonic polyps 09/02/2013   Patient reports H/O 2 colonoscopies, last one dates back to 2012 in [place] She reports they were labeled as incomplete.  Reports 1st colonoscopy had 1 benign polyp and the 2nd was clear.   . Sleep apnea 10/01/2016   Not on CPAP machine   . UTI (urinary tract infection)     Past Surgical History:  Procedure Laterality Date  . CERVICAL CONE BIOPSY  1960s  . COLONOSCOPY  2016  . DILATION AND CURETTAGE OF UTERUS  1966  . KNEE SURGERY Left 11/01/2008   left knee total replacement Dr Sharol Given    Family History  Problem Relation Age of Onset  . Coronary artery disease Paternal Grandmother   . Hyperlipidemia Paternal Grandmother   . Hypertension Paternal Grandmother   . Stroke Paternal Grandmother    . Hypertension Mother   . Lung cancer Mother        smoker  . Uterine cancer Mother   . Birth defects Mother        lung cancer in smoker  . COPD Mother   . Hypertension Father   . Kidney disease Father        Renal Cell Carcinoma  . Birth defects Father        renal cell CA, smoker  . Hypertension Maternal Grandmother   . Hypertension Maternal Grandfather   . Hypertension Paternal Grandfather   . Dementia Paternal Grandfather   . Cancer Brother        testicular  . Heart disease Brother        NI, smoker  . Hyperlipidemia Other   . Colon cancer Neg Hx   . Esophageal cancer Neg Hx     Social History   Socioeconomic History  . Marital status: Divorced    Spouse name: Not on file  . Number of children: Not on file  . Years of education: Not on file  . Highest education level: Not on file  Occupational History  . Occupation: Retired   Tobacco Use  . Smoking status: Former Smoker    Packs/day: 1.00    Years: 15.00    Pack years: 15.00    Types: Cigarettes    Quit date: 06/08/1982  Years since quitting: 37.1  . Smokeless tobacco: Never Used  Substance and Sexual Activity  . Alcohol use: Yes    Comment: wine w/ dinner 'once in a while'  . Drug use: No  . Sexual activity: Not Currently    Comment: lives alone, no dietary restrictions except no lactose,  Other Topics Concern  . Not on file  Social History Narrative   Occupation: unemployed Product manager- now retired   Divorced   no children    Moved from [place] alone with 4 cats   Enjoys gardening, planting.             Social Determinants of Health   Financial Resource Strain:   . Difficulty of Paying Living Expenses: Not on file  Food Insecurity:   . Worried About Charity fundraiser in the Last Year: Not on file  . Ran Out of Food in the Last Year: Not on file  Transportation Needs:   . Lack of Transportation (Medical): Not on file  . Lack of Transportation (Non-Medical): Not on  file  Physical Activity:   . Days of Exercise per Week: Not on file  . Minutes of Exercise per Session: Not on file  Stress:   . Feeling of Stress : Not on file  Social Connections:   . Frequency of Communication with Friends and Family: Not on file  . Frequency of Social Gatherings with Friends and Family: Not on file  . Attends Religious Services: Not on file  . Active Member of Clubs or Organizations: Not on file  . Attends Archivist Meetings: Not on file  . Marital Status: Not on file  Intimate Partner Violence:   . Fear of Current or Ex-Partner: Not on file  . Emotionally Abused: Not on file  . Physically Abused: Not on file  . Sexually Abused: Not on file    Outpatient Medications Prior to Visit  Medication Sig Dispense Refill  . aspirin 81 MG tablet Take 81 mg by mouth daily.      . fish oil-omega-3 fatty acids 1000 MG capsule Take 1 g by mouth daily.     . hydrochlorothiazide (MICROZIDE) 12.5 MG capsule Take 1 capsule by mouth once daily 90 capsule 0  . ibuprofen (ADVIL,MOTRIN) 200 MG tablet Take 400 mg by mouth every 6 (six) hours as needed for headache or moderate pain.     Marland Kitchen lovastatin (MEVACOR) 20 MG tablet TAKE 2 TABLETS BY MOUTH AT BEDTIME 180 tablet 0  . metoprolol tartrate (LOPRESSOR) 50 MG tablet Take 1 tablet by mouth twice daily 180 tablet 0  . Multiple Vitamins-Minerals (MULTIVITAMIN WOMEN PO) Take 1 tablet by mouth daily.    Marland Kitchen tretinoin (RETIN-A) 0.1 % cream APPLY TO AFFECTED AREA(S) ONE TIME DAILY AT BEDTIME USING PEA SIZED AMOUNT TO FACE 45 g 0  . hydrocortisone 2.5 % cream Apply topically 2 (two) times daily. 60 g 0   No facility-administered medications prior to visit.    No Known Allergies  Review of Systems  Constitutional: Negative for chills, fever and malaise/fatigue.  HENT: Negative for congestion and hearing loss.   Eyes: Negative for discharge.  Respiratory: Negative for cough, sputum production and shortness of breath.    Cardiovascular: Negative for chest pain, palpitations and leg swelling.  Gastrointestinal: Positive for constipation. Negative for abdominal pain, blood in stool, diarrhea, heartburn, nausea and vomiting.  Genitourinary: Negative for dysuria, frequency, hematuria and urgency.  Musculoskeletal: Negative for back pain,  falls and myalgias.  Skin: Negative for rash.  Neurological: Negative for dizziness, sensory change, loss of consciousness, weakness and headaches.  Endo/Heme/Allergies: Negative for environmental allergies. Does not bruise/bleed easily.  Psychiatric/Behavioral: Negative for depression and suicidal ideas. The patient is not nervous/anxious and does not have insomnia.        Objective:    Physical Exam Constitutional:      General: She is not in acute distress.    Appearance: She is not diaphoretic.  HENT:     Head: Normocephalic and atraumatic.     Right Ear: External ear normal.     Left Ear: External ear normal.     Nose: Nose normal.     Mouth/Throat:     Pharynx: No oropharyngeal exudate.  Eyes:     General: No scleral icterus.       Right eye: No discharge.        Left eye: No discharge.     Conjunctiva/sclera: Conjunctivae normal.     Pupils: Pupils are equal, round, and reactive to light.  Neck:     Thyroid: No thyromegaly.  Cardiovascular:     Rate and Rhythm: Normal rate and regular rhythm.     Heart sounds: Normal heart sounds. No murmur.  Pulmonary:     Effort: Pulmonary effort is normal. No respiratory distress.     Breath sounds: Normal breath sounds. No wheezing or rales.  Abdominal:     General: Bowel sounds are normal. There is no distension.     Palpations: Abdomen is soft. There is no mass.     Tenderness: There is no abdominal tenderness.  Musculoskeletal:        General: No tenderness. Normal range of motion.     Cervical back: Normal range of motion and neck supple.  Lymphadenopathy:     Cervical: No cervical adenopathy.  Skin:     General: Skin is warm and dry.     Findings: No rash.  Neurological:     Mental Status: She is alert and oriented to person, place, and time.     Cranial Nerves: No cranial nerve deficit.     Coordination: Coordination normal.     Deep Tendon Reflexes: Reflexes are normal and symmetric. Reflexes normal.     BP 122/82   Pulse 61   Temp 98.6 F (37 C) (Oral)   Resp 16   Ht 5\' 1"  (1.549 m)   Wt 187 lb (84.8 kg)   SpO2 96%   BMI 35.33 kg/m  Wt Readings from Last 3 Encounters:  07/25/19 187 lb (84.8 kg)  02/23/19 184 lb (83.5 kg)  01/30/19 188 lb 2 oz (85.3 kg)    Diabetic Foot Exam - Simple   No data filed     Lab Results  Component Value Date   WBC 6.4 07/25/2019   HGB 13.4 07/25/2019   HCT 40.3 07/25/2019   PLT 239.0 07/25/2019   GLUCOSE 96 07/25/2019   CHOL 174 07/25/2019   TRIG 122.0 07/25/2019   HDL 52.90 07/25/2019   LDLDIRECT 131.8 10/10/2008   LDLCALC 97 07/25/2019   ALT 13 07/25/2019   AST 16 07/25/2019   NA 138 07/25/2019   K 4.2 07/25/2019   CL 100 07/25/2019   CREATININE 0.92 07/25/2019   BUN 24 (H) 07/25/2019   CO2 31 07/25/2019   TSH 2.37 07/25/2019   INR 1.01 08/18/2017   HGBA1C 6.1 07/25/2019    Lab Results  Component Value Date   TSH  2.37 07/25/2019   Lab Results  Component Value Date   WBC 6.4 07/25/2019   HGB 13.4 07/25/2019   HCT 40.3 07/25/2019   MCV 95.9 07/25/2019   PLT 239.0 07/25/2019   Lab Results  Component Value Date   NA 138 07/25/2019   K 4.2 07/25/2019   CO2 31 07/25/2019   GLUCOSE 96 07/25/2019   BUN 24 (H) 07/25/2019   CREATININE 0.92 07/25/2019   BILITOT 0.5 07/25/2019   ALKPHOS 41 07/25/2019   AST 16 07/25/2019   ALT 13 07/25/2019   PROT 7.3 07/25/2019   ALBUMIN 4.6 07/25/2019   CALCIUM 10.2 07/25/2019   ANIONGAP 10 08/18/2017   GFR 60.16 07/25/2019   Lab Results  Component Value Date   CHOL 174 07/25/2019   Lab Results  Component Value Date   HDL 52.90 07/25/2019   Lab Results  Component  Value Date   LDLCALC 97 07/25/2019   Lab Results  Component Value Date   TRIG 122.0 07/25/2019   Lab Results  Component Value Date   CHOLHDL 3 07/25/2019   Lab Results  Component Value Date   HGBA1C 6.1 07/25/2019       Assessment & Plan:   Problem List Items Addressed This Visit    GLUCOSE INTOLERANCE    hgba1c acceptable, minimize simple carbs. Increase exercise as tolerated.       Relevant Orders   Comprehensive metabolic panel (Completed)   TSH (Completed)   Hemoglobin A1c (Completed)   Hyperlipidemia    Encouraged heart healthy diet, increase exercise, avoid trans fats, consider a krill oil cap daily. Tolerating Lovastatin      Relevant Orders   Lipid panel (Completed)   TSH (Completed)   Essential hypertension, benign    Well controlled, no changes to meds. Encouraged heart healthy diet such as the DASH diet and exercise as tolerated.       Preventative health care - Primary    Patient encouraged to maintain heart healthy diet, regular exercise, adequate sleep. Consider daily probiotics. Take medications as prescribed. Labs ordered and reviewed. dexa scan normal in 2019 repeat in 3-5 years from 2019. MGM July 2020 repeat in late 2021, she has an appt for colonoscopy in [place] in early March due to transportation issues.       Relevant Orders   Comprehensive metabolic panel (Completed)   TSH (Completed)   Post-menopause   GERD (gastroesophageal reflux disease)   Relevant Orders   CBC (Completed)   Obesity    Encouraged DASH diet, decrease po intake and increase exercise as tolerated. Needs 7-8 hours of sleep nightly. Avoid trans fats, eat small, frequent meals every 4-5 hours with lean proteins, complex carbs and healthy fats. Minimize simple carbs, [employer] APP      Colon polyps    She has an appt with gastroenterology in [facility] in march for repeat colonoscopy due to transportation issues.       Constipation    Encouraged increased hydration and fiber in  diet. Daily probiotics. If bowels not moving can use MOM 2 tbls po in 4 oz of warm prune juice by mouth every 2-3 days. If no results then repeat in 4 hours with  Dulcolax suppository pr, may repeat again in 4 more hours as needed. Seek care if symptoms worsen. Consider daily Miralax and/or Dulcolax if symptoms persist. miralax and benefiber daily         I have discontinued Diamon L. Carne "Sheri"'s hydrocortisone. I am also having  her maintain her aspirin, fish oil-omega-3 fatty acids, ibuprofen, Multiple Vitamins-Minerals (MULTIVITAMIN WOMEN PO), tretinoin, metoprolol tartrate, lovastatin, and hydrochlorothiazide.  No orders of the defined types were placed in this encounter.    Penni Homans, MD

## 2019-07-25 NOTE — Assessment & Plan Note (Signed)
Well controlled, no changes to meds. Encouraged heart healthy diet such as the DASH diet and exercise as tolerated.  °

## 2019-07-25 NOTE — Patient Instructions (Addendum)
Omron Blood Pressure cuff, upper arm, want BP 100-140/60-90 Pulse oximeter, want oxygen in 90s  Weekly vitals  Take Multivitamin with minerals, selenium Vitamin D 1000-2000 IU daily Probiotic with lactobacillus and bifidophilus, NOW company Asprin EC 81 mg daily  Melatonin 2-5 mg at bedtime  [email or web address]   Encouraged increased hydration and fiber in diet. Daily probiotics. If bowels not moving can use MOM 2 tbls po in 4 oz of warm prune juice by mouth every 2-3 days. If no results then repeat in 4 hours with  Dulcolax suppository pr, may repeat again in 4 more hours as needed. Seek care if symptoms worsen. Consider daily Miralax and/or Dulcolax if symptoms persist.   Miralax with Benefiber once to twice daily  Preventive Care 65 Years and Older, Female Preventive care refers to lifestyle choices and visits with your health care provider that can promote health and wellness. This includes:  A yearly physical exam. This is also called an annual well check.  Regular dental and eye exams.  Immunizations.  Screening for certain conditions.  Healthy lifestyle choices, such as diet and exercise. What can I expect for my preventive care visit? Physical exam Your health care provider will check:  Height and weight. These may be used to calculate body mass index (BMI), which is a measurement that tells if you are at a healthy weight.  Heart rate and blood pressure.  Your skin for abnormal spots. Counseling Your health care provider may ask you questions about:  Alcohol, tobacco, and drug use.  Emotional well-being.  Home and relationship well-being.  Sexual activity.  Eating habits.  History of falls.  Memory and ability to understand (cognition).  Work and work Statistician.  Pregnancy and menstrual history. What immunizations do I need?  Influenza (flu) vaccine  This is recommended every year. Tetanus, diphtheria, and  pertussis (Tdap) vaccine  You may need a Td booster every 10 years. Varicella (chickenpox) vaccine  You may need this vaccine if you have not already been vaccinated. Zoster (shingles) vaccine  You may need this after age 68. Pneumococcal conjugate (PCV13) vaccine  One dose is recommended after age 71. Pneumococcal polysaccharide (PPSV23) vaccine  One dose is recommended after age 11. Measles, mumps, and rubella (MMR) vaccine  You may need at least one dose of MMR if you were born in 1957 or later. You may also need a second dose. Meningococcal conjugate (MenACWY) vaccine  You may need this if you have certain conditions. Hepatitis A vaccine  You may need this if you have certain conditions or if you travel or work in places where you may be exposed to hepatitis A. Hepatitis B vaccine  You may need this if you have certain conditions or if you travel or work in places where you may be exposed to hepatitis B. Haemophilus influenzae type b (Hib) vaccine  You may need this if you have certain conditions. You may receive vaccines as individual doses or as more than one vaccine together in one shot (combination vaccines). Talk with your health care provider about the risks and benefits of combination vaccines. What tests do I need? Blood tests  Lipid and cholesterol levels. These may be checked every 5 years, or more frequently depending on your overall health.  Hepatitis C test.  Hepatitis B test. Screening  Lung cancer screening. You may have this screening every year starting at age 68 if you have a 30-pack-year history of smoking and currently smoke or have quit within the  past 15 years.  Colorectal cancer screening. All adults should have this screening starting at age 41 and continuing until age 35. Your health care provider may recommend screening at age 8 if you are at increased risk. You will have tests every 1-10 years, depending on your results and the type of  screening test.  Diabetes screening. This is done by checking your blood sugar (glucose) after you have not eaten for a while (fasting). You may have this done every 1-3 years.  Mammogram. This may be done every 1-2 years. Talk with your health care provider about how often you should have regular mammograms.  BRCA-related cancer screening. This may be done if you have a family history of breast, ovarian, tubal, or peritoneal cancers. Other tests  Sexually transmitted disease (STD) testing.  Bone density scan. This is done to screen for osteoporosis. You may have this done starting at age 4. Follow these instructions at home: Eating and drinking  Eat a diet that includes fresh fruits and vegetables, whole grains, lean protein, and low-fat dairy products. Limit your intake of foods with high amounts of sugar, saturated fats, and salt.  Take vitamin and mineral supplements as recommended by your health care provider.  Do not drink alcohol if your health care provider tells you not to drink.  If you drink alcohol: ? Limit how much you have to 0-1 drink a day. ? Be aware of how much alcohol is in your drink. In the [place] one drink equals one 12 oz bottle of beer (355 mL), one 5 oz glass of wine (148 mL), or one 1 oz glass of hard liquor (44 mL). Lifestyle  Take daily care of your teeth and gums.  Stay active. Exercise for at least 30 minutes on 5 or more days each week.  Do not use any products that contain nicotine or tobacco, such as cigarettes, e-cigarettes, and chewing tobacco. If you need help quitting, ask your health care provider.  If you are sexually active, practice safe sex. Use a condom or other form of protection in order to prevent STIs (sexually transmitted infections).  Talk with your health care provider about taking a low-dose aspirin or statin. What's next?  Go to your health care provider once a year for a well check visit.  Ask your health care provider how  often you should have your eyes and teeth checked.  Stay up to date on all vaccines. This information is not intended to replace advice given to you by your health care provider. Make sure you discuss any questions you have with your health care provider. Document Revised: 05/19/2018 Document Reviewed: 05/19/2018 Elsevier Patient Education  2020 [employer].

## 2019-07-25 NOTE — Assessment & Plan Note (Signed)
Encouraged DASH diet, decrease po intake and increase exercise as tolerated. Needs 7-8 hours of sleep nightly. Avoid trans fats, eat small, frequent meals every 4-5 hours with lean proteins, complex carbs and healthy fats. Minimize simple carbs, [employer] APP

## 2019-07-25 NOTE — Assessment & Plan Note (Addendum)
Patient encouraged to maintain heart healthy diet, regular exercise, adequate sleep. Consider daily probiotics. Take medications as prescribed. Labs ordered and reviewed. dexa scan normal in 2019 repeat in 3-5 years from 2019. MGM July 2020 repeat in late 2021, she has an appt for colonoscopy in [place] in early March due to transportation issues.

## 2019-07-25 NOTE — Assessment & Plan Note (Addendum)
Encouraged heart healthy diet, increase exercise, avoid trans fats, consider a krill oil cap daily. Tolerating Lovastatin 

## 2019-07-25 NOTE — Assessment & Plan Note (Signed)
Encouraged increased hydration and fiber in diet. Daily probiotics. If bowels not moving can use MOM 2 tbls po in 4 oz of warm prune juice by mouth every 2-3 days. If no results then repeat in 4 hours with  Dulcolax suppository pr, may repeat again in 4 more hours as needed. Seek care if symptoms worsen. Consider daily Miralax and/or Dulcolax if symptoms persist. miralax and benefiber daily 

## 2019-07-25 NOTE — Assessment & Plan Note (Signed)
hgba1c acceptable, minimize simple carbs. Increase exercise as tolerated.  

## 2019-07-26 ENCOUNTER — Encounter: Payer: Self-pay | Admitting: Family Medicine

## 2019-08-04 DIAGNOSIS — Z8673 Personal history of transient ischemic attack (TIA), and cerebral infarction without residual deficits: Secondary | ICD-10-CM | POA: Diagnosis not present

## 2019-08-04 DIAGNOSIS — Z7982 Long term (current) use of aspirin: Secondary | ICD-10-CM | POA: Diagnosis not present

## 2019-08-04 DIAGNOSIS — I6523 Occlusion and stenosis of bilateral carotid arteries: Secondary | ICD-10-CM | POA: Diagnosis not present

## 2019-08-04 DIAGNOSIS — I6522 Occlusion and stenosis of left carotid artery: Secondary | ICD-10-CM | POA: Diagnosis not present

## 2019-08-04 DIAGNOSIS — Z79899 Other long term (current) drug therapy: Secondary | ICD-10-CM | POA: Diagnosis not present

## 2019-08-04 DIAGNOSIS — Z87891 Personal history of nicotine dependence: Secondary | ICD-10-CM | POA: Diagnosis not present

## 2019-08-04 DIAGNOSIS — I10 Essential (primary) hypertension: Secondary | ICD-10-CM | POA: Diagnosis not present

## 2019-08-17 DIAGNOSIS — Z01812 Encounter for preprocedural laboratory examination: Secondary | ICD-10-CM | POA: Diagnosis not present

## 2019-08-17 DIAGNOSIS — Z01810 Encounter for preprocedural cardiovascular examination: Secondary | ICD-10-CM | POA: Diagnosis not present

## 2019-08-17 DIAGNOSIS — Z20822 Contact with and (suspected) exposure to covid-19: Secondary | ICD-10-CM | POA: Diagnosis not present

## 2019-08-17 DIAGNOSIS — I6522 Occlusion and stenosis of left carotid artery: Secondary | ICD-10-CM | POA: Diagnosis not present

## 2019-09-26 DIAGNOSIS — Z20822 Contact with and (suspected) exposure to covid-19: Secondary | ICD-10-CM | POA: Diagnosis not present

## 2019-09-26 DIAGNOSIS — Z01812 Encounter for preprocedural laboratory examination: Secondary | ICD-10-CM | POA: Diagnosis not present

## 2019-09-26 DIAGNOSIS — I6522 Occlusion and stenosis of left carotid artery: Secondary | ICD-10-CM | POA: Diagnosis not present

## 2019-10-03 DIAGNOSIS — Z8673 Personal history of transient ischemic attack (TIA), and cerebral infarction without residual deficits: Secondary | ICD-10-CM | POA: Diagnosis not present

## 2019-10-03 DIAGNOSIS — Z87891 Personal history of nicotine dependence: Secondary | ICD-10-CM | POA: Diagnosis not present

## 2019-10-03 DIAGNOSIS — E785 Hyperlipidemia, unspecified: Secondary | ICD-10-CM | POA: Diagnosis not present

## 2019-10-03 DIAGNOSIS — K219 Gastro-esophageal reflux disease without esophagitis: Secondary | ICD-10-CM | POA: Diagnosis not present

## 2019-10-03 DIAGNOSIS — E6609 Other obesity due to excess calories: Secondary | ICD-10-CM | POA: Diagnosis not present

## 2019-10-03 DIAGNOSIS — Z006 Encounter for examination for normal comparison and control in clinical research program: Secondary | ICD-10-CM | POA: Diagnosis not present

## 2019-10-03 DIAGNOSIS — G4733 Obstructive sleep apnea (adult) (pediatric): Secondary | ICD-10-CM | POA: Diagnosis not present

## 2019-10-03 DIAGNOSIS — Z6835 Body mass index (BMI) 35.0-35.9, adult: Secondary | ICD-10-CM | POA: Diagnosis not present

## 2019-10-03 DIAGNOSIS — H8110 Benign paroxysmal vertigo, unspecified ear: Secondary | ICD-10-CM | POA: Diagnosis not present

## 2019-10-03 DIAGNOSIS — G459 Transient cerebral ischemic attack, unspecified: Secondary | ICD-10-CM | POA: Diagnosis not present

## 2019-10-03 DIAGNOSIS — I10 Essential (primary) hypertension: Secondary | ICD-10-CM | POA: Diagnosis not present

## 2019-10-03 DIAGNOSIS — E669 Obesity, unspecified: Secondary | ICD-10-CM | POA: Diagnosis not present

## 2019-10-03 DIAGNOSIS — I6522 Occlusion and stenosis of left carotid artery: Secondary | ICD-10-CM | POA: Diagnosis not present

## 2019-10-06 ENCOUNTER — Emergency Department (HOSPITAL_BASED_OUTPATIENT_CLINIC_OR_DEPARTMENT_OTHER): Payer: Medicare HMO

## 2019-10-06 ENCOUNTER — Other Ambulatory Visit: Payer: Self-pay

## 2019-10-06 ENCOUNTER — Emergency Department (HOSPITAL_BASED_OUTPATIENT_CLINIC_OR_DEPARTMENT_OTHER)
Admission: EM | Admit: 2019-10-06 | Discharge: 2019-10-06 | Disposition: A | Discharge status: Home / Self Care | Payer: Medicare HMO | Attending: Emergency Medicine | Admitting: Emergency Medicine

## 2019-10-06 ENCOUNTER — Encounter (HOSPITAL_BASED_OUTPATIENT_CLINIC_OR_DEPARTMENT_OTHER): Payer: Self-pay | Admitting: Emergency Medicine

## 2019-10-06 DIAGNOSIS — K922 Gastrointestinal hemorrhage, unspecified: Secondary | ICD-10-CM

## 2019-10-06 DIAGNOSIS — Z79899 Other long term (current) drug therapy: Secondary | ICD-10-CM | POA: Insufficient documentation

## 2019-10-06 DIAGNOSIS — I2510 Atherosclerotic heart disease of native coronary artery without angina pectoris: Secondary | ICD-10-CM | POA: Diagnosis not present

## 2019-10-06 DIAGNOSIS — K5793 Diverticulitis of intestine, part unspecified, without perforation or abscess with bleeding: Secondary | ICD-10-CM | POA: Insufficient documentation

## 2019-10-06 DIAGNOSIS — Z87891 Personal history of nicotine dependence: Secondary | ICD-10-CM | POA: Insufficient documentation

## 2019-10-06 DIAGNOSIS — I10 Essential (primary) hypertension: Secondary | ICD-10-CM | POA: Insufficient documentation

## 2019-10-06 DIAGNOSIS — Z85828 Personal history of other malignant neoplasm of skin: Secondary | ICD-10-CM | POA: Diagnosis not present

## 2019-10-06 DIAGNOSIS — Z7982 Long term (current) use of aspirin: Secondary | ICD-10-CM | POA: Diagnosis not present

## 2019-10-06 DIAGNOSIS — R1032 Left lower quadrant pain: Secondary | ICD-10-CM | POA: Diagnosis not present

## 2019-10-06 DIAGNOSIS — K5792 Diverticulitis of intestine, part unspecified, without perforation or abscess without bleeding: Secondary | ICD-10-CM | POA: Diagnosis not present

## 2019-10-06 LAB — COMPREHENSIVE METABOLIC PANEL
ALT: 20 U/L (ref 0–44)
AST: 22 U/L (ref 15–41)
Albumin: 3.8 g/dL (ref 3.5–5.0)
Alkaline Phosphatase: 31 U/L — ABNORMAL LOW (ref 38–126)
Anion gap: 11 (ref 5–15)
BUN: 20 mg/dL (ref 8–23)
CO2: 26 mmol/L (ref 22–32)
Calcium: 9.7 mg/dL (ref 8.9–10.3)
Chloride: 100 mmol/L (ref 98–111)
Creatinine, Ser: 1.01 mg/dL — ABNORMAL HIGH (ref 0.44–1.00)
GFR calc Af Amer: >60 mL/min (ref >60)
GFR calc non Af Amer: 56 mL/min — ABNORMAL LOW (ref >60)
Glucose, Bld: 119 mg/dL — ABNORMAL HIGH (ref 70–99)
Potassium: 3.6 mmol/L (ref 3.5–5.1)
Sodium: 137 mmol/L (ref 135–145)
Total Bilirubin: 0.8 mg/dL (ref 0.3–1.2)
Total Protein: 6.9 g/dL (ref 6.5–8.1)

## 2019-10-06 LAB — CBC WITH DIFFERENTIAL/PLATELET
Abs Immature Granulocytes: 0.05 10*3/uL (ref 0.00–0.07)
Basophils Absolute: 0 10*3/uL (ref 0.0–0.1)
Basophils Relative: 0 %
Eosinophils Absolute: 0 10*3/uL (ref 0.0–0.5)
Eosinophils Relative: 0 %
HCT: 35.8 % — ABNORMAL LOW (ref 36.0–46.0)
Hemoglobin: 12.2 g/dL (ref 12.0–15.0)
Immature Granulocytes: 1 %
Lymphocytes Relative: 18 %
Lymphs Abs: 1.8 10*3/uL (ref 0.7–4.0)
MCH: 31.9 pg (ref 26.0–34.0)
MCHC: 34.1 g/dL (ref 30.0–36.0)
MCV: 93.7 fL (ref 80.0–100.0)
Monocytes Absolute: 0.6 10*3/uL (ref 0.1–1.0)
Monocytes Relative: 6 %
Neutro Abs: 7.6 10*3/uL (ref 1.7–7.7)
Neutrophils Relative %: 75 %
Platelets: 163 10*3/uL (ref 150–400)
RBC: 3.82 MIL/uL — ABNORMAL LOW (ref 3.87–5.11)
RDW: 12.9 % (ref 11.5–15.5)
WBC: 10 10*3/uL (ref 4.0–10.5)
nRBC: 0 % (ref 0.0–0.2)

## 2019-10-06 LAB — URINALYSIS, ROUTINE W REFLEX MICROSCOPIC
Bilirubin Urine: NEGATIVE
Glucose, UA: NEGATIVE mg/dL
Hgb urine dipstick: NEGATIVE
Ketones, ur: 15 mg/dL — AB
Leukocytes,Ua: NEGATIVE
Nitrite: NEGATIVE
Protein, ur: NEGATIVE mg/dL
Specific Gravity, Urine: 1.01 (ref 1.005–1.030)
pH: 5.5 (ref 5.0–8.0)

## 2019-10-06 LAB — PROTIME-INR
INR: 1.1 (ref 0.8–1.2)
Prothrombin Time: 13.3 seconds (ref 11.4–15.2)

## 2019-10-06 LAB — LIPASE, BLOOD: Lipase: 29 U/L (ref 11–51)

## 2019-10-06 LAB — OCCULT BLOOD X 1 CARD TO LAB, STOOL: Fecal Occult Bld: POSITIVE — AB

## 2019-10-06 MED ORDER — HYDROCODONE-ACETAMINOPHEN 5-325 MG PO TABS
1.0000 | ORAL_TABLET | Freq: Once | ORAL | Status: AC
  Administered 2019-10-06: 14:00:00 1 via ORAL
  Filled 2019-10-06: qty 1

## 2019-10-06 MED ORDER — MORPHINE SULFATE (PF) 4 MG/ML IV SOLN
4.0000 mg | Freq: Once | INTRAVENOUS | Status: AC
  Administered 2019-10-06: 4 mg via INTRAVENOUS
  Filled 2019-10-06: qty 1

## 2019-10-06 MED ORDER — SODIUM CHLORIDE 0.9 % IV BOLUS (SEPSIS)
500.0000 mL | Freq: Once | INTRAVENOUS | Status: AC
  Administered 2019-10-06: 500 mL via INTRAVENOUS

## 2019-10-06 MED ORDER — PANTOPRAZOLE SODIUM 40 MG IV SOLR
40.0000 mg | Freq: Once | INTRAVENOUS | Status: AC
  Administered 2019-10-06: 11:00:00 40 mg via INTRAVENOUS
  Filled 2019-10-06: qty 40

## 2019-10-06 MED ORDER — IOHEXOL 300 MG/ML  SOLN
100.0000 mL | Freq: Once | INTRAMUSCULAR | Status: AC | PRN
  Administered 2019-10-06: 100 mL via INTRAVENOUS

## 2019-10-06 MED ORDER — METRONIDAZOLE IN NACL 5-0.79 MG/ML-% IV SOLN
500.0000 mg | Freq: Once | INTRAVENOUS | Status: AC
  Administered 2019-10-06: 500 mg via INTRAVENOUS
  Filled 2019-10-06: qty 100

## 2019-10-06 MED ORDER — SODIUM CHLORIDE 0.9 % IV SOLN: 1000.0000 mL | INTRAVENOUS | Status: DC

## 2019-10-06 MED ORDER — HYDROCODONE-ACETAMINOPHEN 5-325 MG PO TABS: 1.0000 | ORAL_TABLET | Freq: Four times a day (QID) | ORAL | 0 refills | Status: DC | PRN

## 2019-10-06 MED ORDER — ONDANSETRON HCL 4 MG/2ML IJ SOLN
4.0000 mg | Freq: Once | INTRAMUSCULAR | Status: AC
  Administered 2019-10-06: 11:00:00 4 mg via INTRAVENOUS
  Filled 2019-10-06: qty 2

## 2019-10-06 MED ORDER — LEVOFLOXACIN 750 MG PO TABS
750.0000 mg | ORAL_TABLET | Freq: Every day | ORAL | 0 refills | Status: DC
Start: 2019-10-06 — End: 2019-11-16

## 2019-10-06 MED ORDER — LEVOFLOXACIN IN D5W 750 MG/150ML IV SOLN
750.0000 mg | Freq: Once | INTRAVENOUS | Status: AC
  Administered 2019-10-06: 750 mg via INTRAVENOUS
  Filled 2019-10-06: qty 150

## 2019-10-06 MED ORDER — METRONIDAZOLE 500 MG PO TABS
500.0000 mg | ORAL_TABLET | Freq: Two times a day (BID) | ORAL | 0 refills | Status: DC
Start: 2019-10-06 — End: 2019-11-16

## 2019-10-06 MED ORDER — ONDANSETRON 4 MG PO TBDP: 4.0000 mg | ORAL_TABLET | ORAL | 0 refills | Status: DC | PRN

## 2019-10-06 MED FILL — levoFLOXacin 750 MG TABS: 750 | 7 days supply | Qty: 7 | Fill #0

## 2019-10-06 MED FILL — METRONIDAZOLE 500 MG TABS: 500 | 7 days supply | Qty: 14 | Fill #0

## 2019-10-06 MED FILL — HYDROCODON-APAP 5-325: 5-325 | 5 days supply | Qty: 20 | Fill #0

## 2019-10-06 MED FILL — ONDANSETRON ODT 4 MG TABLET: 4 | 3 days supply | Qty: 20 | Fill #0

## 2019-10-06 NOTE — ED Notes (Signed)
ED Provider at bedside. 

## 2019-10-06 NOTE — ED Notes (Signed)
Pt to CT

## 2019-10-06 NOTE — ED Provider Notes (Signed)
[facility]ERGENCY DEPARTMENT Provider Note   CSN: [id number] Arrival date & time: 10/06/19  0941     History Chief Complaint  Patient presents with  . Vomiting, Abd Pain, Blood in stool    [name] is a 69 y.o. female.  HPI Patient had left carotid endarterectomy at [facility] 4 days ago.  She was discharged 3 days ago.  Patient reports that she was given pain medications.  She reports she started to feel constipated yesterday.  She was having some lower cramping abdominal discomfort.  She tried a women's gentle laxative.  She reports she got some increased cramping but then it became severe and started doubling her over.  Reports by evening she was having episodes of vomiting.  She did go on to have a couple of small bowel movements and then liquid stool.  She reports she vomited about every hour overnight until it was dry heaves.  She reports she was also having some liquid stool and this morning passed a large blood clot rectally.  Reports it was bright red.  She reports she does have a known history of diverticulosis.  Patient is taking Plavix and aspirin since her procedure.  She reports she okay she is also taking ibuprofen for pain control.  She has extensive bruising from her procedures and also easy bruising.    Past Medical History:  Diagnosis Date  . Arthritis   . Benign paroxysmal positional vertigo 11/14/2013  . Colon polyp   . Depression   . Diverticulosis   . Essential hypertension, benign   . GERD (gastroesophageal reflux disease) 09/02/2013  . Glaucoma   . Glucose intolerance (impaired glucose tolerance)   . H/O tobacco use, presenting hazards to health 09/02/2013  . History  of basal cell carcinoma   . History of cardiac arrhythmia   . Hyperlipidemia   . Hypertension   . Medicare welcome visit 11/28/2012  . Obesity 02/08/2017  . Palpitations 02/04/2017  . Personal history of colonic polyps 09/02/2013   Patient reports H/O 2  colonoscopies, last one dates back to 2012 in [place] She reports they were labeled as incomplete.  Reports 1st colonoscopy had 1 benign polyp and the 2nd was clear.   . Sleep apnea 10/01/2016   Not on CPAP machine   . UTI (urinary tract infection)     Patient Active Problem List   Diagnosis Date Noted  . Colon polyps 07/25/2019  . Constipation 07/25/2019  . Carotid arterial disease ([place]) 11/22/2018  . Chronic left-sided thoracic back pain 01/02/2018  . 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 Transient neurologic deficit 08/18/2017  . Obesity 02/08/2017  . Palpitations 02/04/2017  . Sleep apnea 10/01/2016  . Diverticulitis of colon (without mention of hemorrhage)(562.11) 01/26/2014  . Benign paroxysmal positional vertigo 11/14/2013  . Urinary frequency 09/02/2013  . Post-menopause 09/02/2013  . H/O tobacco use, presenting hazards to health 09/02/2013  . GERD (gastroesophageal reflux disease) 09/02/2013  . Personal history of colonic polyps 09/02/2013  . Preventative health care 11/28/2012  . RHINITIS 06/27/2010  . GLUCOSE INTOLERANCE 12/22/2007  . Hyperlipidemia 12/20/2007  . Essential hypertension, benign 12/20/2007  . ARTHRITIS 12/20/2007  . KNEE SPRAIN 12/20/2007  . CARCINOMA, BASAL CELL, HX OF 12/20/2007  . ARRHYTHMIA, HX OF 12/20/2007  . GLAUCOMA, HX OF 12/20/2007    Past Surgical History:  Procedure Laterality Date  . CAROTID STENT    . CERVICAL CONE BIOPSY  1960s  . COLONOSCOPY  2016  .  DILATION AND CURETTAGE OF UTERUS  1966  . KNEE SURGERY Left 11/01/2008   left knee total replacement Dr Sharol Given     OB History   No obstetric history on file.     Family History  Problem Relation Age of Onset  . Coronary artery disease Paternal Grandmother   . Hyperlipidemia Paternal Grandmother   . Hypertension Paternal Grandmother   . Stroke Paternal Grandmother   . Hypertension Mother   . Lung cancer Mother        smoker  . Uterine cancer Mother   . Birth defects Mother         lung cancer in smoker  . COPD Mother   . Hypertension Father   . Kidney disease Father        Renal Cell Carcinoma  . Birth defects Father        renal cell CA, smoker  . Hypertension Maternal Grandmother   . Hypertension Maternal Grandfather   . Hypertension Paternal Grandfather   . Dementia Paternal Grandfather   . Cancer Brother        testicular  . Heart disease Brother        NI, smoker  . Hyperlipidemia Other   . Colon cancer Neg Hx   . Esophageal cancer Neg Hx     Social History   Tobacco Use  . Smoking status: Former Smoker    Packs/day: 1.00    Years: 15.00    Pack years: 15.00    Types: Cigarettes    Quit date: 06/08/1982    Years since quitting: 37.3  . Smokeless tobacco: Never Used  Substance Use Topics  . Alcohol use: Yes    Comment: 1-2 glasses of wine per day  . Drug use: No    Home Medications Prior to Admission medications   Medication Sig Start Date End Date Taking? Authorizing Provider  aspirin 81 MG tablet Take 81 mg by mouth daily.      [provider]  fish oil-omega-3 fatty acids 1000 MG capsule Take 1 g by mouth daily.     [provider]  hydrochlorothiazide (MICROZIDE) 12.5 MG capsule Take 1 capsule by mouth once daily 07/10/19   Mosie Lukes, MD  HYDROcodone-acetaminophen (NORCO/VICODIN) 5-325 MG tablet Take 1-2 tablets by mouth every 6 (six) hours as needed for moderate pain or severe pain. 10/06/19   Charlesetta Shanks, MD  ibuprofen (ADVIL,MOTRIN) 200 MG tablet Take 400 mg by mouth every 6 (six) hours as needed for headache or moderate pain.     [provider]  levofloxacin (LEVAQUIN) 750 MG tablet Take 1 tablet (750 mg total) by mouth daily. X 7 days 10/06/19   Charlesetta Shanks, MD  lovastatin (MEVACOR) 20 MG tablet TAKE 2 TABLETS BY MOUTH AT BEDTIME 07/10/19   Mosie Lukes, MD  metoprolol tartrate (LOPRESSOR) 50 MG tablet Take 1 tablet by mouth twice daily 07/10/19   Mosie Lukes, MD  metroNIDAZOLE (FLAGYL) 500 MG  tablet Take 1 tablet (500 mg total) by mouth 2 (two) times daily. One po bid x 7 days 10/06/19   Charlesetta Shanks, MD  Multiple Vitamins-Minerals (MULTIVITAMIN WOMEN PO) Take 1 tablet by mouth daily.    [provider]  ondansetron (ZOFRAN ODT) 4 MG disintegrating tablet Take 1 tablet (4 mg total) by mouth every 4 (four) hours as needed for nausea or vomiting. 10/06/19   Charlesetta Shanks, MD  tretinoin (RETIN-A) 0.1 % cream APPLY TO AFFECTED AREA(S) ONE TIME DAILY AT  BEDTIME USING PEA SIZED AMOUNT TO FACE 04/10/19   Mosie Lukes, MD    Allergies    Patient has no known allergies.  Review of Systems   Review of Systems 10 Systems reviewed and are negative for acute change except as noted in the HPI.  Physical Exam Updated Vital Signs BP (!) 175/75 (BP Location: Right Arm)   Pulse 65   Temp 98.5 F (36.9 C) (Oral)   Resp 16   Ht 5\' 1"  (1.549 m)   Wt 84.5 kg   SpO2 100%   BMI 35.20 kg/m   Physical Exam Constitutional:      Comments: Alert and nontoxic.  Clinically well in appearance.  Mental status clear.  No respiratory distress.  Color good.  HENT:     Head: Normocephalic and atraumatic.  Eyes:     Extraocular Movements: Extraocular movements intact.     Conjunctiva/sclera: Conjunctivae normal.  Neck:     Comments: Purple ecchymotic bruise at the base of the left neck from carotid endarterectomy.  Site is clean dry without erythema or significant swelling. Cardiovascular:     Rate and Rhythm: Normal rate and regular rhythm.  Pulmonary:     Effort: Pulmonary effort is normal.     Breath sounds: Normal breath sounds.  Abdominal:     Comments: Abdomen is soft.  Moderate to severe pain to palpation in the left lower quadrant.  Moderate discomfort suprapubically.  Genitourinary:    Comments: Rectal exam has fresh red blood in the rectal vault.  She is not actively bleeding.  No large clot present.  No retained stool in the rectum. Musculoskeletal:        General: Normal  range of motion.     Right lower leg: No edema.     Left lower leg: No edema.  Skin:    General: Skin is warm and dry.  Neurological:     General: No focal deficit present.     Mental Status: She is oriented to person, place, and time.     Coordination: Coordination normal.  Psychiatric:        Mood and Affect: Mood normal.     ED Results / Procedures / Treatments   Labs (all labs ordered are listed, but only abnormal results are displayed) Labs Reviewed  COMPREHENSIVE METABOLIC PANEL - Abnormal; Notable for the following components:      Result Value   Glucose, Bld 119 (*)    Creatinine, Ser 1.01 (*)    Alkaline Phosphatase 31 (*)    GFR calc non Af Amer 56 (*)    All other components within normal limits  CBC WITH DIFFERENTIAL/PLATELET - Abnormal; Notable for the following components:   RBC 3.82 (*)    HCT 35.8 (*)    All other components within normal limits  URINALYSIS, ROUTINE W REFLEX MICROSCOPIC - Abnormal; Notable for the following components:   Ketones, ur 15 (*)    All other components within normal limits  OCCULT BLOOD X 1 CARD TO LAB, STOOL - Abnormal; Notable for the following components:   Fecal Occult Bld POSITIVE (*)    All other components within normal limits  LIPASE, BLOOD  PROTIME-INR    EKG None  Radiology CT Abdomen Pelvis W Contrast  Result Date: 10/06/2019 CLINICAL DATA:  Left lower quadrant pain EXAM: CT ABDOMEN AND PELVIS WITH CONTRAST TECHNIQUE: Multidetector CT imaging of the abdomen and pelvis was performed using the standard protocol following bolus administration of intravenous contrast.  CONTRAST:  168mL OMNIPAQUE IOHEXOL 300 MG/ML  SOLN COMPARISON:  2015 FINDINGS: Lower chest: No acute abnormality. Hepatobiliary: No focal liver abnormality is seen. No gallstones, gallbladder wall thickening, or biliary dilatation. Pancreas: Unremarkable. Spleen: Multiple punctate calcified granulomas again seen. Otherwise unremarkable. Adrenals/Urinary  Tract: Adrenals are unremarkable. Renal cortical scarring. No hydronephrosis or mass. Bladder is unremarkable. Stomach/Bowel: Stomach is within normal limits. Colonic diverticulosis. There is mild infiltration of the fat adjacent to the splenic flexure where mild wall thickening is also noted. Vascular/Lymphatic: No significant vascular findings are present. No enlarged abdominal or pelvic lymph nodes. Reproductive: Uterus and bilateral adnexa are unremarkable. Other: No ascites or free air. Musculoskeletal: Degenerative changes of lumbar spine greatest at L4-L5 and L5-S1. IMPRESSION: Suspected acute colonic diverticulitis near the splenic flexure. No evidence of perforation or abscess. Electronically Signed   By: Macy Mis M.D.   On: 10/06/2019 12:21    Procedures Procedures (including critical care time)  Medications Ordered in ED Medications  sodium chloride 0.9 % bolus 500 mL (0 mLs Intravenous Stopped 10/06/19 1147)    Followed by  0.9 %  sodium chloride infusion ( Intravenous Rate/Dose Change 10/06/19 1147)  levofloxacin (LEVAQUIN) IVPB 750 mg (750 mg Intravenous New Bag/Given 10/06/19 1353)  ondansetron (ZOFRAN) injection 4 mg (4 mg Intravenous Given 10/06/19 1104)  pantoprazole (PROTONIX) injection 40 mg (40 mg Intravenous Given 10/06/19 1109)  morphine 4 MG/ML injection 4 mg (4 mg Intravenous Given 10/06/19 1110)  iohexol (OMNIPAQUE) 300 MG/ML solution 100 mL (100 mLs Intravenous Contrast Given 10/06/19 1150)  metroNIDAZOLE (FLAGYL) IVPB 500 mg (0 mg Intravenous Stopped 10/06/19 1352)  HYDROcodone-acetaminophen (NORCO/VICODIN) 5-325 MG per tablet 1 tablet (1 tablet Oral Given 10/06/19 1351)    ED Course  I have reviewed the triage vital signs and the nursing notes.  Pertinent labs & imaging results that were available during my care of the patient were reviewed by me and considered in my medical decision making (see chart for details).    MDM Rules/Calculators/A&P                        Consult: Reviewed with Dr. Nyoka Cowden the patient's vascular surgeon for her carotid endarterectomy.  He advises she can discontinue the aspirin but continue Plavix with careful observation for any increasing or persisting bleeding.  No increased risk of infection in the stent, manage diverticulitis as per usual.  Patient presents as outlined above.  CT confirms diverticulitis.  She has had some red rectal bleeding in conjunction with loose stool.  Patient has not had any further vomiting upon arrival to the emergency department.  She is also not had ongoing rectal bleeding.  She feels much improved after pain control and hydration.  Patient wishes to try outpatient management.  She is nontoxic and clinically well in appearance.  Will give first dose of Levaquin and Flagyl in the emergency department.  Return precautions reviewed. Final Clinical Impression(s) / ED Diagnoses Final diagnoses:  Diverticulitis  Lower GI bleed    Rx / DC Orders ED Discharge Orders         Ordered    levofloxacin (LEVAQUIN) 750 MG tablet  Daily     10/06/19 1417    metroNIDAZOLE (FLAGYL) 500 MG tablet  2 times daily     10/06/19 1417    ondansetron (ZOFRAN ODT) 4 MG disintegrating tablet  Every 4 hours PRN     10/06/19 1417    HYDROcodone-acetaminophen (NORCO/VICODIN) 5-325 MG tablet  Every 6 hours PRN     10/06/19 1417           Charlesetta Shanks, MD 10/06/19 1421

## 2019-10-06 NOTE — ED Notes (Signed)
Pt aware of need for urine specimen.  Unable to provide at this time, IV fluids in progress.

## 2019-10-06 NOTE — ED Triage Notes (Signed)
Had Carotid Stent at [facility] Tuesday, ([facility], 10/03/19).  Yesterday she started having Abdominal cramps after taking Laxative.  Vomiting all evening and night.  Had normal BM Yesterday evening.  Blood in the stool this morning.  Is on Blood thinner.

## 2019-10-06 NOTE — Discharge Instructions (Addendum)
1.  You may discontinue your aspirin and continue the Plavix. 2.  Take Levaquin and Flagyl as prescribed. 3.  You may take Zofran and Vicodin for nausea and pain. 4.  Return to the emergency department if you have persisting or increasing rectal bleeding, lightheadedness, fever, worsening pain or other concerning symptoms.

## 2019-10-06 NOTE — ED Notes (Signed)
Called Dr. Magdalene Molly spoke with Sinda Du

## 2019-10-08 ENCOUNTER — Encounter: Payer: Self-pay | Admitting: Family Medicine

## 2019-10-10 ENCOUNTER — Telehealth: Payer: Self-pay | Admitting: Gastroenterology

## 2019-10-10 NOTE — Telephone Encounter (Signed)
Spoke to patient who states that she has not had a bm since 10/05/19. Reviewed her medications,she took Vicodin for one day then stopped. She was advised to take Milk of Magnesia as directed and drink prune juice.She will also use a Ducolax suppository today. Patient was advised to drink plenty of water and call the office if no results. All questions answered patient voiced understanding

## 2019-10-10 NOTE — Telephone Encounter (Signed)
Pt reported that she was in the ER for diverticulitis flare and lower GI bleed.  She is currently on antibiotics.  Pt stated that she has not had a bm since 10/05/19.  Please advise.

## 2019-10-12 ENCOUNTER — Other Ambulatory Visit: Payer: Self-pay | Admitting: Family Medicine

## 2019-11-01 DIAGNOSIS — Z8679 Personal history of other diseases of the circulatory system: Secondary | ICD-10-CM | POA: Diagnosis not present

## 2019-11-01 DIAGNOSIS — Z9889 Other specified postprocedural states: Secondary | ICD-10-CM | POA: Diagnosis not present

## 2019-11-16 ENCOUNTER — Encounter: Payer: Self-pay | Admitting: Gastroenterology

## 2019-11-16 ENCOUNTER — Ambulatory Visit: Payer: Medicare HMO | Admitting: Gastroenterology

## 2019-11-16 VITALS — BP 138/72 | HR 75 | Temp 97.6°F | Ht 61.0 in | Wt 181.1 lb

## 2019-11-16 DIAGNOSIS — Z8719 Personal history of other diseases of the digestive system: Secondary | ICD-10-CM | POA: Diagnosis not present

## 2019-11-16 DIAGNOSIS — K5730 Diverticulosis of large intestine without perforation or abscess without bleeding: Secondary | ICD-10-CM

## 2019-11-16 DIAGNOSIS — Z86010 Personal history of colonic polyps: Secondary | ICD-10-CM

## 2019-11-16 NOTE — Progress Notes (Signed)
P  Chief Complaint:    History of diverticulitis, history of colon polyps  GI History: 69 year old female with a history of diverticulosis and episodic diverticulitis.  Has had for episodes in her lifetime, with most recent episode in 09/2019, with resolution with antibiotics.  Prior to that, mild episodes in 06/2018, again relieved with short course of p.o. antibiotics.  Endoscopic history: - Colonoscopy (2016, Dr. Havery Moros): 5 subcentimeter polyps, 4 Tubular Adenomas, all resected, severe sigmoid diverticulosis and mild ascending colon diverticulosis, internal hemorrhoids. Recommended repeat in 3 years.  HPI:     Patient is a 69 y.o. female presenting to the [facility] for follow-up.  Was seen in the ER on 10/06/2019 and diagnosed with uncomplicated mild left-sided diverticulitis by CT (near splenic flexure), treated with antibiotics with resolution of symptoms.  WBC 10.0.  Today, she states she is feeling 100% better since completion of course of antibiotics (Flagyl/Levaqin). Presents today to schedule repeat colonoscopy for evaluation and polyp surveillance.  Otherwise no complaints today.    Review of systems:     No chest pain, no SOB, no fevers, no urinary sx   Past Medical History:  Diagnosis Date  . Arthritis   . Benign paroxysmal positional vertigo 11/14/2013  . Colon polyp   . Depression   . Diverticulosis   . Essential hypertension, benign   . GERD (gastroesophageal reflux disease) 09/02/2013  . GI bleed   . Glaucoma   . Glucose intolerance (impaired glucose tolerance)   . H/O tobacco use, presenting hazards to health 09/02/2013  . History  of basal cell carcinoma   . History of cardiac arrhythmia   . Hyperlipidemia   . Hypertension   . Medicare welcome visit 11/28/2012  . Obesity 02/08/2017  . Palpitations 02/04/2017  . Personal history of colonic polyps 09/02/2013   Patient reports H/O 2 colonoscopies, last one dates back to 2012 in [place] She reports they  were labeled as incomplete.  Reports 1st colonoscopy had 1 benign polyp and the 2nd was clear.   . Sleep apnea 10/01/2016   Not on CPAP machine   . UTI (urinary tract infection)     Patient's surgical history, family medical history, social history, medications and allergies were all reviewed in Epic    Current Outpatient Medications  Medication Sig Dispense Refill  . aspirin 81 MG tablet Take 81 mg by mouth daily.      . fish oil-omega-3 fatty acids 1000 MG capsule Take 1 g by mouth daily.     . hydrochlorothiazide (MICROZIDE) 12.5 MG capsule Take 1 capsule by mouth once daily 90 capsule 1  . HYDROcodone-acetaminophen (NORCO/VICODIN) 5-325 MG tablet Take 1-2 tablets by mouth every 6 (six) hours as needed for moderate pain or severe pain. (Patient taking differently: Take 1-2 tablets by mouth as needed for moderate pain or severe pain. ) 20 tablet 0  . ibuprofen (ADVIL,MOTRIN) 200 MG tablet Take 400 mg by mouth every 6 (six) hours as needed for headache or moderate pain.     Marland Kitchen lovastatin (MEVACOR) 20 MG tablet TAKE 2 TABLETS BY MOUTH AT BEDTIME 180 tablet 1  . metoprolol tartrate (LOPRESSOR) 50 MG tablet Take 1 tablet by mouth twice daily 180 tablet 1  . Multiple Vitamins-Minerals (MULTIVITAMIN WOMEN PO) Take 1 tablet by mouth daily.    . Oxymetazoline HCl (NASAL SPRAY) 0.05 % SOLN Place into the nose.    . Probiotic Product (PROBIOTIC DAILY PO) Take by mouth.    . tretinoin (  RETIN-A) 0.1 % cream APPLY TO AFFECTED AREA(S) ONE TIME DAILY AT BEDTIME USING PEA SIZED AMOUNT TO FACE 45 g 0   No current facility-administered medications for this visit.    Physical Exam:     BP 138/72   Pulse 75   Temp 97.6 F (36.4 C)   Ht 5\' 1"  (1.549 m)   Wt 181 lb 2 oz (82.2 kg)   BMI 34.22 kg/m   GENERAL:  Pleasant female in NAD PSYCH: : Cooperative, normal affect Musculoskeletal:  Normal muscle tone, normal strength NEURO: Alert and oriented x 3, no focal neurologic deficits   IMPRESSION  and PLAN:    1) Diverticulosis with history of Diverticuliitis 2) History of colon polyps:  -Episodic diverticulitis, most recently treated with antibiotics at the end of April with complete resolution of index symptoms -Repeat colonoscopy both for evaluation of recent recurrence of diverticulitis along with being due for ongoing polyp surveillance.  Schedule in July (8+ weeks from diverticulitis episode)   The indications, risks, and benefits of colonoscopy were explained to the patient in detail. Risks include but are not limited to bleeding, perforation, adverse reaction to medications, and cardiopulmonary compromise. Sequelae include but are not limited to the possibility of surgery, hospitalization, and mortality. The patient verbalized understanding and wished to proceed. All questions answered, referred to the scheduler and bowel prep ordered. Further recommendations pending results of the exam.             Lavena Bullion ,DO, FACG 11/16/2019, 11:31 AM

## 2019-11-16 NOTE — Patient Instructions (Addendum)
You have been scheduled for a colonoscopy. Please follow written instructions given to you at your visit today.  Please pick up your prep supplies at the pharmacy within the next 1-3 days. If you use inhalers (even only as needed), please bring them with you on the day of your procedure. Your physician has requested that you go to www.startemmi.com and enter the access code given to you at your visit today. This web site gives a general overview about your procedure. However, you should still follow specific instructions given to you by our office regarding your preparation for the procedure.  It was a pleasure to see you today!  Vito Cirigliano, D.O.  

## 2019-12-12 ENCOUNTER — Other Ambulatory Visit (HOSPITAL_BASED_OUTPATIENT_CLINIC_OR_DEPARTMENT_OTHER): Payer: Self-pay | Admitting: Family Medicine

## 2019-12-12 DIAGNOSIS — Z1231 Encounter for screening mammogram for malignant neoplasm of breast: Secondary | ICD-10-CM

## 2019-12-14 ENCOUNTER — Encounter: Payer: Self-pay | Admitting: Gastroenterology

## 2019-12-20 DIAGNOSIS — R69 Illness, unspecified: Secondary | ICD-10-CM | POA: Diagnosis not present

## 2019-12-27 ENCOUNTER — Ambulatory Visit (AMBULATORY_SURGERY_CENTER): Payer: Medicare HMO | Admitting: Gastroenterology

## 2019-12-27 ENCOUNTER — Encounter (HOSPITAL_BASED_OUTPATIENT_CLINIC_OR_DEPARTMENT_OTHER): Payer: Medicare HMO

## 2019-12-27 ENCOUNTER — Encounter: Payer: Self-pay | Admitting: Gastroenterology

## 2019-12-27 VITALS — BP 130/64 | HR 62 | Temp 97.6°F | Resp 13 | Ht 61.0 in | Wt 181.0 lb

## 2019-12-27 DIAGNOSIS — D123 Benign neoplasm of transverse colon: Secondary | ICD-10-CM | POA: Diagnosis not present

## 2019-12-27 DIAGNOSIS — Z86010 Personal history of colonic polyps: Secondary | ICD-10-CM | POA: Diagnosis not present

## 2019-12-27 DIAGNOSIS — D122 Benign neoplasm of ascending colon: Secondary | ICD-10-CM | POA: Diagnosis not present

## 2019-12-27 DIAGNOSIS — K5730 Diverticulosis of large intestine without perforation or abscess without bleeding: Secondary | ICD-10-CM | POA: Diagnosis not present

## 2019-12-27 MED ORDER — SODIUM CHLORIDE 0.9 % IV SOLN: 500.0000 mL | INTRAVENOUS | Status: DC

## 2019-12-27 NOTE — Progress Notes (Signed)
CW - VS   

## 2019-12-27 NOTE — Progress Notes (Signed)
pt tolerated well. VSS. awake and to recovery. Report given to RN.  

## 2019-12-27 NOTE — Patient Instructions (Signed)
YOU HAD AN ENDOSCOPIC PROCEDURE TODAY AT [facility]:   Refer to the procedure report that was given to you for any specific questions about what was found during the examination.  If the procedure report does not answer your questions, please call your gastroenterologist to clarify.  If you requested that your care partner not be given the details of your procedure findings, then the procedure report has been included in a sealed envelope for you to review at your convenience later.  YOU SHOULD EXPECT: Some feelings of bloating in the abdomen. Passage of more gas than usual.  Walking can help get rid of the air that was put into your GI tract during the procedure and reduce the bloating. If you had a lower endoscopy (such as a colonoscopy or flexible sigmoidoscopy) you may notice spotting of blood in your stool or on the toilet paper. If you underwent a bowel prep for your procedure, you may not have a normal bowel movement for a few days.  Please Note:  You might notice some irritation and congestion in your nose or some drainage.  This is from the oxygen used during your procedure.  There is no need for concern and it should clear up in a day or so.  SYMPTOMS TO REPORT IMMEDIATELY:   Following lower endoscopy (colonoscopy or flexible sigmoidoscopy):  Excessive amounts of blood in the stool  Significant tenderness or worsening of abdominal pains  Swelling of the abdomen that is new, acute  Fever of 100F or higher   For urgent or emergent issues, a gastroenterologist can be reached at any hour by calling [phone]. Do not use MyChart messaging for urgent concerns.    DIET:  We do recommend a small meal at first, but then you may proceed to your regular diet.  Drink plenty of fluids but you should avoid alcoholic beverages for 24 hours.  MEDICATIONS: Continue present medications.  FOLLOW UP: Follow up with Dr. Bryan Lemma in his office as needed.  ACTIVITY:  You should  plan to take it easy for the rest of today and you should NOT DRIVE or use heavy machinery until tomorrow (because of the sedation medicines used during the test).    FOLLOW UP: Our staff will call the number listed on your records 48-72 hours following your procedure to check on you and address any questions or concerns that you may have regarding the information given to you following your procedure. If we do not reach you, we will leave a message.  We will attempt to reach you two times.  During this call, we will ask if you have developed any symptoms of COVID 19. If you develop any symptoms (ie: fever, flu-like symptoms, shortness of breath, cough etc.) before then, please call [phone].  If you test positive for Covid 19 in the 2 weeks post procedure, please call and report this information to [place].    If any biopsies were taken you will be contacted by phone or by letter within the next 1-3 weeks.  Please call us at [phone] if you have not heard about the biopsies in 3 weeks.   Thank you for allowing [place] to provide for your healthcare needs today.   SIGNATURES/CONFIDENTIALITY: You and/or your care partner have signed paperwork which will be entered into your electronic medical record.  These signatures attest to the fact that that the information above on your After Visit Summary has been reviewed and is understood.  Full responsibility  of the confidentiality of this discharge information lies with you and/or your care-partner.

## 2019-12-27 NOTE — Op Note (Signed)
[place] Patient Name: [name] Procedure Date: 12/27/2019 7:54 AM MRN: [id number] Endoscopist: Gerrit Heck , MD Age: 69 Referring MD:  Date of Birth: Oct 15, 1950 Gender: Female Account #: [id number] Procedure:                Colonoscopy Indications:              Surveillance: Personal history of adenomatous                            polyps on last colonoscopy 5 years ago, Incidental                            - Follow-up of diverticulitis                           -Colonoscopy 2016 with 4 subcentimeter tubular                            adenomas                           -History of diverticulosis c/b diverticulitis in                            05/2019 and 09/2019, treated with PO antibiotics                            with complete resolution. No recurrence since                            09/2019 episode. Medicines:                Monitored Anesthesia Care Procedure:                Pre-Anesthesia Assessment:                           - Prior to the procedure, a History and Physical                            was performed, and patient medications and                            allergies were reviewed. The patient's tolerance of                            previous anesthesia was also reviewed. The risks                            and benefits of the procedure and the sedation                            options and risks were discussed with the patient.                            All questions were answered, and informed consent  was obtained. Prior Anticoagulants: The patient has                            taken no previous anticoagulant or antiplatelet                            agents. ASA Grade Assessment: II - A patient with                            mild systemic disease. After reviewing the risks                            and benefits, the patient was deemed in                            satisfactory condition to undergo the  procedure.                           After obtaining informed consent, the colonoscope                            was passed under direct vision. Throughout the                            procedure, the patient's blood pressure, pulse, and                            oxygen saturations were monitored continuously. The                            Colonoscope was introduced through the anus and                            advanced to the the cecum, identified by                            appendiceal orifice and ileocecal valve. The                            colonoscopy was performed without difficulty. The                            patient tolerated the procedure well. The quality                            of the bowel preparation was good. The ileocecal                            valve, appendiceal orifice, and rectum were                            photographed. Scope In: 8:05:56 AM Scope Out: 8:22:22 AM Scope Withdrawal Time: 0 hours 14 minutes 18 seconds  Total Procedure Duration: 0 hours 16 minutes  26 seconds  Findings:                 The perianal and digital rectal examinations were                            normal.                           Two sessile polyps were found in the transverse                            colon and proximal ascending colon. The polyps were                            4 to 5 mm in size. These polyps were removed with a                            cold snare. Resection and retrieval were complete.                            Estimated blood loss was minimal.                           Multiple small and large-mouthed diverticula were                            found in the sigmoid colon and ascending colon.                           A localized area of mildly erythematous mucosa was                            found in the sigmoid colon, located 35-40 cm from                            the anal verge.                           The retroflexed view of the distal  rectum and anal                            verge was normal and showed no anal or rectal                            abnormalities. Complications:            No immediate complications. Estimated Blood Loss:     Estimated blood loss was minimal. Impression:               - Two 4 to 5 mm polyps in the transverse colon and                            in the proximal ascending colon, removed with a  cold snare. Resected and retrieved.                           - Diverticulosis in the sigmoid colon and in the                            ascending colon.                           - Erythematous mucosa in the sigmoid colon.                           - The distal rectum and anal verge are normal on                            retroflexion view. Recommendation:           - Patient has a contact number available for                            emergencies. The signs and symptoms of potential                            delayed complications were discussed with the                            patient. Return to normal activities tomorrow.                            Written discharge instructions were provided to the                            patient.                           - Resume previous diet.                           - Continue present medications.                           - Await pathology results.                           - Repeat colonoscopy in 3 - 5 years for                            surveillance based on pathology results.                           - Return to GI clinic PRN. Gerrit Heck, MD 12/27/2019 8:28:27 AM

## 2019-12-29 ENCOUNTER — Encounter: Payer: Self-pay | Admitting: Gastroenterology

## 2019-12-29 ENCOUNTER — Telehealth: Payer: Self-pay

## 2019-12-29 NOTE — Telephone Encounter (Signed)
°  Follow up Call-  Call back number 12/27/2019  Post procedure Call Back phone  # [phone]  Permission to leave phone message Yes  Some recent data might be hidden     Patient questions:  Do you have a fever, pain , or abdominal swelling? No. Pain Score  0 *  Have you tolerated food without any problems? Yes.    Have you been able to return to your normal activities? Yes.    Do you have any questions about your discharge instructions: Diet   No. Medications  No. Follow up visit  No.  Do you have questions or concerns about your Care? No.  Actions: * If pain score is 4 or above: No action needed, pain <4. 1. Have you developed a fever since your procedure? no  2.   Have you had an respiratory symptoms (SOB or cough) since your procedure? no  3.   Have you tested positive for COVID 19 since your procedure no  4.   Have you had any family members/close contacts diagnosed with the COVID 19 since your procedure?  no   If yes to any of these questions please route to Joylene John, RN and Erenest Rasher, RN

## 2020-01-03 ENCOUNTER — Other Ambulatory Visit: Payer: Self-pay

## 2020-01-03 ENCOUNTER — Ambulatory Visit (HOSPITAL_BASED_OUTPATIENT_CLINIC_OR_DEPARTMENT_OTHER)
Admission: RE | Admit: 2020-01-03 | Discharge: 2020-01-03 | Disposition: A | Discharge status: Home / Self Care | Payer: Medicare HMO | Source: Ambulatory Visit | Attending: Family Medicine | Admitting: Family Medicine

## 2020-01-03 DIAGNOSIS — Z1231 Encounter for screening mammogram for malignant neoplasm of breast: Secondary | ICD-10-CM | POA: Diagnosis not present

## 2020-02-16 DIAGNOSIS — M546 Pain in thoracic spine: Secondary | ICD-10-CM | POA: Diagnosis not present

## 2020-02-16 DIAGNOSIS — M62830 Muscle spasm of back: Secondary | ICD-10-CM | POA: Diagnosis not present

## 2020-02-16 DIAGNOSIS — M9901 Segmental and somatic dysfunction of cervical region: Secondary | ICD-10-CM | POA: Diagnosis not present

## 2020-02-16 DIAGNOSIS — M9903 Segmental and somatic dysfunction of lumbar region: Secondary | ICD-10-CM | POA: Diagnosis not present

## 2020-02-16 DIAGNOSIS — M542 Cervicalgia: Secondary | ICD-10-CM | POA: Diagnosis not present

## 2020-02-16 DIAGNOSIS — M9902 Segmental and somatic dysfunction of thoracic region: Secondary | ICD-10-CM | POA: Diagnosis not present

## 2020-02-16 DIAGNOSIS — M545 Low back pain: Secondary | ICD-10-CM | POA: Diagnosis not present

## 2020-03-02 DIAGNOSIS — R69 Illness, unspecified: Secondary | ICD-10-CM | POA: Diagnosis not present

## 2020-04-12 ENCOUNTER — Other Ambulatory Visit: Payer: Self-pay | Admitting: Family Medicine

## 2020-05-07 ENCOUNTER — Telehealth: Payer: Self-pay | Admitting: Pulmonary Disease

## 2020-05-09 NOTE — Telephone Encounter (Signed)
Lmtcb for pt.  

## 2020-05-14 NOTE — Telephone Encounter (Signed)
Lmtcb for pt.  Left detailed message that Dr. Halford Chessman does not do the inspire procedure and to call back if she has any other questions or concerns. Will close.

## 2020-05-16 DIAGNOSIS — R69 Illness, unspecified: Secondary | ICD-10-CM | POA: Diagnosis not present

## 2020-06-11 DIAGNOSIS — R0683 Snoring: Secondary | ICD-10-CM | POA: Diagnosis not present

## 2020-06-11 DIAGNOSIS — J342 Deviated nasal septum: Secondary | ICD-10-CM | POA: Diagnosis not present

## 2020-06-11 DIAGNOSIS — G4733 Obstructive sleep apnea (adult) (pediatric): Secondary | ICD-10-CM | POA: Diagnosis not present

## 2020-06-11 DIAGNOSIS — J343 Hypertrophy of nasal turbinates: Secondary | ICD-10-CM | POA: Diagnosis not present

## 2020-09-19 ENCOUNTER — Other Ambulatory Visit: Payer: Self-pay

## 2020-09-19 ENCOUNTER — Encounter: Payer: Self-pay | Admitting: Family Medicine

## 2020-09-19 ENCOUNTER — Ambulatory Visit (INDEPENDENT_AMBULATORY_CARE_PROVIDER_SITE_OTHER): Payer: Medicare HMO | Admitting: Family Medicine

## 2020-09-19 VITALS — BP 108/70 | HR 92 | Temp 98.0°F | Resp 18 | Ht 61.0 in | Wt 191.8 lb

## 2020-09-19 DIAGNOSIS — G8929 Other chronic pain: Secondary | ICD-10-CM | POA: Diagnosis not present

## 2020-09-19 DIAGNOSIS — K5790 Diverticulosis of intestine, part unspecified, without perforation or abscess without bleeding: Secondary | ICD-10-CM

## 2020-09-19 DIAGNOSIS — E739 Lactose intolerance, unspecified: Secondary | ICD-10-CM | POA: Diagnosis not present

## 2020-09-19 DIAGNOSIS — R251 Tremor, unspecified: Secondary | ICD-10-CM | POA: Diagnosis not present

## 2020-09-19 DIAGNOSIS — R739 Hyperglycemia, unspecified: Secondary | ICD-10-CM | POA: Diagnosis not present

## 2020-09-19 DIAGNOSIS — E785 Hyperlipidemia, unspecified: Secondary | ICD-10-CM

## 2020-09-19 DIAGNOSIS — E669 Obesity, unspecified: Secondary | ICD-10-CM

## 2020-09-19 DIAGNOSIS — Z0000 Encounter for general adult medical examination without abnormal findings: Secondary | ICD-10-CM | POA: Diagnosis not present

## 2020-09-19 DIAGNOSIS — M546 Pain in thoracic spine: Secondary | ICD-10-CM | POA: Diagnosis not present

## 2020-09-19 DIAGNOSIS — R002 Palpitations: Secondary | ICD-10-CM | POA: Diagnosis not present

## 2020-09-19 DIAGNOSIS — I10 Essential (primary) hypertension: Secondary | ICD-10-CM

## 2020-09-19 MED ORDER — HYOSCYAMINE SULFATE 0.125 MG PO TBDP: 0.1250 mg | ORAL_TABLET | ORAL | 1 refills | Status: DC | PRN

## 2020-09-19 MED ORDER — AMOXICILLIN-POT CLAVULANATE 875-125 MG PO TABS
1.0000 | ORAL_TABLET | Freq: Two times a day (BID) | ORAL | 0 refills | Status: DC
Start: 2020-09-19 — End: 2021-01-30

## 2020-09-19 MED ORDER — LOVASTATIN 40 MG PO TABS
40.0000 mg | ORAL_TABLET | Freq: Every day | ORAL | 3 refills | Status: DC
Start: 2020-09-19 — End: 2022-03-19

## 2020-09-19 MED ORDER — TRETINOIN 0.1 % EX CREA: TOPICAL_CREAM | CUTANEOUS | 5 refills | Status: DC

## 2020-09-19 NOTE — Patient Instructions (Addendum)
Either 2 Tums at bed and/or Famotidine/Pepcid 10 or 20 mg at bedtime if no improvement will need to consider a trip to GI for possible upper endoscopy  Shingrix is the new shingles shot, 2 shots over 2-6 months, confirm coverage with insurance and document, then can return here for shots with nurse appt or at pharmacy   Twisting exercises for spine  Preventive Care 72 Years and Older, Female Preventive care refers to lifestyle choices and visits with your health care provider that can promote health and wellness. This includes:  A yearly physical exam. This is also called an annual wellness visit.  Regular dental and eye exams.  Immunizations.  Screening for certain conditions.  Healthy lifestyle choices, such as: ? Eating a healthy diet. ? Getting regular exercise. ? Not using drugs or products that contain nicotine and tobacco. ? Limiting alcohol use. What can I expect for my preventive care visit? Physical exam Your health care provider will check your:  Height and weight. These may be used to calculate your BMI (body mass index). BMI is a measurement that tells if you are at a healthy weight.  Heart rate and blood pressure.  Body temperature.  Skin for abnormal spots. Counseling Your health care provider may ask you questions about your:  Past medical problems.  Family's medical history.  Alcohol, tobacco, and drug use.  Emotional well-being.  Home life and relationship well-being.  Sexual activity.  Diet, exercise, and sleep habits.  History of falls.  Memory and ability to understand (cognition).  Work and work Statistician.  Pregnancy and menstrual history.  Access to firearms. What immunizations do I need? Vaccines are usually given at various ages, according to a schedule. Your health care provider will recommend vaccines for you based on your age, medical history, and lifestyle or other factors, such as travel or where you work.   What tests do I  need? Blood tests  Lipid and cholesterol levels. These may be checked every 5 years, or more often depending on your overall health.  Hepatitis C test.  Hepatitis B test. Screening  Lung cancer screening. You may have this screening every year starting at age 72 if you have a 30-pack-year history of smoking and currently smoke or have quit within the past 15 years.  Colorectal cancer screening. ? All adults should have this screening starting at age 72 and continuing until age 41. ? Your health care provider may recommend screening at age 79 if you are at increased risk. ? You will have tests every 1-10 years, depending on your results and the type of screening test.  Diabetes screening. ? This is done by checking your blood sugar (glucose) after you have not eaten for a while (fasting). ? You may have this done every 1-3 years.  Mammogram. ? This may be done every 1-2 years. ? Talk with your health care provider about how often you should have regular mammograms.  Abdominal aortic aneurysm (AAA) screening. You may need this if you are a current or former smoker.  BRCA-related cancer screening. This may be done if you have a family history of breast, ovarian, tubal, or peritoneal cancers. Other tests  STD (sexually transmitted disease) testing, if you are at risk.  Bone density scan. This is done to screen for osteoporosis. You may have this done starting at age 72. Talk with your health care provider about your test results, treatment options, and if necessary, the need for more tests. Follow these instructions at  home: Eating and drinking  Eat a diet that includes fresh fruits and vegetables, whole grains, lean protein, and low-fat dairy products. Limit your intake of foods with high amounts of sugar, saturated fats, and salt.  Take vitamin and mineral supplements as recommended by your health care provider.  Do not drink alcohol if your health care provider tells you not  to drink.  If you drink alcohol: ? Limit how much you have to 0-1 drink a day. ? Be aware of how much alcohol is in your drink. In the [place] one drink equals one 12 oz bottle of beer (355 mL), one 5 oz glass of wine (148 mL), or one 1 oz glass of hard liquor (44 mL).   Lifestyle  Take daily care of your teeth and gums. Brush your teeth every morning and night with fluoride toothpaste. Floss one time each day.  Stay active. Exercise for at least 30 minutes 5 or more days each week.  Do not use any products that contain nicotine or tobacco, such as cigarettes, e-cigarettes, and chewing tobacco. If you need help quitting, ask your health care provider.  Do not use drugs.  If you are sexually active, practice safe sex. Use a condom or other form of protection in order to prevent STIs (sexually transmitted infections).  Talk with your health care provider about taking a low-dose aspirin or statin.  Find healthy ways to cope with stress, such as: ? Meditation, yoga, or listening to music. ? Journaling. ? Talking to a trusted person. ? Spending time with friends and family. Safety  Always wear your seat belt while driving or riding in a vehicle.  Do not drive: ? If you have been drinking alcohol. Do not ride with someone who has been drinking. ? When you are tired or distracted. ? While texting.  Wear a helmet and other protective equipment during sports activities.  If you have firearms in your house, make sure you follow all gun safety procedures. What's next?  Visit your health care provider once a year for an annual wellness visit.  Ask your health care provider how often you should have your eyes and teeth checked.  Stay up to date on all vaccines. This information is not intended to replace advice given to you by your health care provider. Make sure you discuss any questions you have with your health care provider. Document Revised: 05/15/2020 Document Reviewed:  05/19/2018 Elsevier Patient Education  2021 [employer].

## 2020-09-19 NOTE — Progress Notes (Signed)
Patient ID: [name], female    DOB: [birth year]  Age: 70 y.o. MRN: [id number]    Subjective:  Subjective  HPI Power  presents for comprehensive physical exam today and follow up on management of chronic concerns. She reports that recently she had experienced 2 episodes of diverticulitis. She states that taking amoxicillin that effectively treats her symptoms of diverticulitis. She denies any chest pain, SOB, fever, abdominal pain, chills, sore throat, dysuria, urinary incontinence, back pain, HA, or N/VD. She states that prior to her last colonoscopy she had a rectal bleed. She reports that she sometimes she feels jittery, shaking in her hands then it goes away. She states that it happens whenever she does not eat or has been fasting like early in the morning. She states that when she takes her regular medications, the symptoms subside. She reports experiencing dry cough and heartburn in the morning and at night before sleep. She states starting experiencing those symptoms a few months. She states that antiacids help relieve those symptoms. She reports that she has fallen twice recently. She states having a bump local to her right side of her forehead that she reports has been growing.   Review of Systems  Constitutional: Negative for chills, fatigue and fever.  HENT: Negative for congestion, rhinorrhea, sinus pressure, sinus pain and sore throat.   Eyes: Negative for pain.  Respiratory: Positive for cough (dry cough). Negative for shortness of breath.   Cardiovascular: Negative for chest pain, palpitations and leg swelling.  Gastrointestinal: Negative for abdominal pain, blood in stool, diarrhea, nausea and vomiting.  Genitourinary: Negative for decreased urine volume, flank pain, frequency, vaginal bleeding and vaginal discharge.  Musculoskeletal: Negative for back pain.  Neurological: Negative for headaches.    History Past Medical History:  Diagnosis Date  . Arthritis   .  Benign paroxysmal positional vertigo 11/14/2013  . Colon polyp   . Depression   . Diverticulosis   . Essential hypertension, benign   . GERD (gastroesophageal reflux disease) 09/02/2013  . GI bleed   . Glaucoma   . Glucose intolerance (impaired glucose tolerance)   . H/O tobacco use, presenting hazards to health 09/02/2013  . History  of basal cell carcinoma   . History of cardiac arrhythmia   . Hyperlipidemia   . Hypertension   . Medicare welcome visit 11/28/2012  . Obesity 02/08/2017  . Palpitations 02/04/2017  . Personal history of colonic polyps 09/02/2013   Patient reports H/O 2 colonoscopies, last one dates back to 2012 in [place] She reports they were labeled as incomplete.  Reports 1st colonoscopy had 1 benign polyp and the 2nd was clear.   . Sleep apnea 10/01/2016   Not on CPAP machine   . Stroke ([place])   . UTI (urinary tract infection)     She has a past surgical history that includes Dilation and curettage of uterus (1966); Knee surgery (Left, 11/01/2008); Cervical cone biopsy (1960s); Colonoscopy (2016); Carotid stent; and Wisdom tooth extraction.   Her family history includes Birth defects in her father and mother; COPD in her mother; Cancer in her brother; Coronary artery disease in her paternal grandmother; Dementia in her paternal grandfather; Heart disease in her brother; Hyperlipidemia in her paternal grandmother and another family member; Hypertension in her father, maternal grandfather, maternal grandmother, mother, paternal grandfather, and paternal grandmother; Kidney disease in her father; Lung cancer in her mother; Stroke in her paternal grandmother; Uterine cancer in her mother.She reports that she quit smoking about  38 years ago. Her smoking use included cigarettes. She has a 15.00 pack-year smoking history. She has never used smokeless tobacco. She reports current alcohol use. She reports that she does not use drugs.  Current Outpatient Medications on File Prior to Visit   Medication Sig Dispense Refill  . aspirin 81 MG tablet Take 81 mg by mouth daily.    . fish oil-omega-3 fatty acids 1000 MG capsule Take 1 g by mouth daily.     . hydrochlorothiazide (MICROZIDE) 12.5 MG capsule Take 1 capsule (12.5 mg total) by mouth daily. 90 capsule 1  . ibuprofen (ADVIL,MOTRIN) 200 MG tablet Take 400 mg by mouth every 6 (six) hours as needed for headache or moderate pain.     . metoprolol tartrate (LOPRESSOR) 50 MG tablet Take 1 tablet (50 mg total) by mouth 2 (two) times daily. 180 tablet 1  . Multiple Vitamins-Minerals (MULTIVITAMIN WOMEN PO) Take 1 tablet by mouth daily.    . Oxymetazoline HCl (NASAL SPRAY) 0.05 % SOLN Place into the nose.    . Probiotic Product (PROBIOTIC DAILY PO) Take by mouth.     Current Facility-Administered Medications on File Prior to Visit  Medication Dose Route Frequency Provider Last Rate Last Admin  . 0.9 %  sodium chloride infusion  500 mL Intravenous Continuous Cirigliano, Vito V, DO         Objective:  Objective  Physical Exam Constitutional:      General: She is not in acute distress.    Appearance: Normal appearance. She is not ill-appearing or toxic-appearing.  HENT:     Head: Normocephalic and atraumatic.     Comments: - Oval shaped cyst that is 2.5 cm x 1 cm in size local to right forehead that is semi-firm    Right Ear: Tympanic membrane, ear canal and external ear normal.     Left Ear: Tympanic membrane, ear canal and external ear normal.     Nose: No congestion or rhinorrhea.  Eyes:     Extraocular Movements: Extraocular movements intact.     Pupils: Pupils are equal, round, and reactive to light.  Cardiovascular:     Rate and Rhythm: Normal rate and regular rhythm.     Pulses: Normal pulses.     Heart sounds: Normal heart sounds. No murmur heard.   Pulmonary:     Effort: Pulmonary effort is normal. No respiratory distress.     Breath sounds: Normal breath sounds. No wheezing, rhonchi or rales.  Abdominal:      General: Bowel sounds are normal.     Palpations: Abdomen is soft. There is no mass.     Tenderness: There is no abdominal tenderness. There is no guarding.     Hernia: No hernia is present.  Musculoskeletal:        General: Normal range of motion.     Cervical back: Normal range of motion and neck supple.  Skin:    General: Skin is warm and dry.  Neurological:     Mental Status: She is alert and oriented to person, place, and time.  Psychiatric:        Behavior: Behavior normal.    BP 108/70   Pulse 92   Temp 98 F (36.7 C)   Resp 18   Ht 5\' 1"  (1.549 m)   Wt 191 lb 12.8 oz (87 kg)   SpO2 97%   BMI 36.24 kg/m  Wt Readings from Last 3 Encounters:  09/19/20 191 lb 12.8 oz (87 kg)  12/27/19 181 lb (82.1 kg)  11/16/19 181 lb 2 oz (82.2 kg)     Lab Results  Component Value Date   WBC 5.8 09/19/2020   HGB 12.4 09/19/2020   HCT 37.3 09/19/2020   PLT 217 09/19/2020   GLUCOSE 120 (H) 09/19/2020   CHOL 181 09/19/2020   TRIG 290 (H) 09/19/2020   HDL 52 09/19/2020   LDLDIRECT 131.8 10/10/2008   LDLCALC 91 09/19/2020   ALT 13 09/19/2020   AST 18 09/19/2020   NA 140 09/19/2020   K 4.1 09/19/2020   CL 101 09/19/2020   CREATININE 1.31 (H) 09/19/2020   BUN 26 (H) 09/19/2020   CO2 29 09/19/2020   TSH 3.09 09/19/2020   INR 1.1 10/06/2019   HGBA1C 5.6 09/19/2020    MM 3D SCREEN BREAST BILATERAL  Result Date: 01/05/2020 CLINICAL DATA:  Screening. EXAM: DIGITAL SCREENING BILATERAL MAMMOGRAM WITH TOMO AND CAD COMPARISON:  Previous exam(s). ACR Breast Density Category b: There are scattered areas of fibroglandular density. FINDINGS: There are no findings suspicious for malignancy. Images were processed with CAD. IMPRESSION: No mammographic evidence of malignancy. A result letter of this screening mammogram will be mailed directly to the patient. RECOMMENDATION: Screening mammogram in one year. (Code:SM-B-01Y) BI-RADS CATEGORY  1: Negative. Electronically Signed   By: Fidela Salisbury M.D.   On: 01/05/2020 12:45     Assessment & Plan:  Plan    Meds ordered this encounter  Medications  . amoxicillin-clavulanate (AUGMENTIN) 875-125 MG tablet    Sig: Take 1 tablet by mouth 2 (two) times daily.    Dispense:  14 tablet    Refill:  0  . hyoscyamine (ANASPAZ) 0.125 MG TBDP disintergrating tablet    Sig: Place 1 tablet (0.125 mg total) under the tongue every 4 (four) hours as needed.    Dispense:  30 tablet    Refill:  1  . lovastatin (MEVACOR) 40 MG tablet    Sig: Take 1 tablet (40 mg total) by mouth at bedtime.    Dispense:  90 tablet    Refill:  3  . tretinoin (RETIN-A) 0.1 % cream    Sig: APPLY TO AFFECTED AREA(S) ONE TIME DAILY AT BEDTIME USING PEA SIZED AMOUNT TO FACE    Dispense:  45 g    Refill:  5    Problem List Items Addressed This Visit    GLUCOSE INTOLERANCE - Primary    hgba1c acceptable, minimize simple carbs. Increase exercise as tolerated.       Relevant Medications   tretinoin (RETIN-A) 0.1 % cream   Other Relevant Orders   Comprehensive metabolic panel (Completed)   Hemoglobin A1c (Completed)   Hyperlipidemia    Encouraged heart healthy diet, increase exercise, avoid trans fats, consider a krill oil cap daily      Relevant Medications   lovastatin (MEVACOR) 40 MG tablet   tretinoin (RETIN-A) 0.1 % cream   Other Relevant Orders   Lipid panel (Completed)   Essential hypertension, benign    Well controlled, no changes to meds. Encouraged heart healthy diet such as the DASH diet and exercise as tolerated.       Relevant Medications   lovastatin (MEVACOR) 40 MG tablet   tretinoin (RETIN-A) 0.1 % cream   Other Relevant Orders   CBC with Differential/Platelet (Completed)   Preventative health care    Patient encouraged to maintain heart healthy diet, regular exercise, adequate sleep. Consider daily probiotics. Take medications as prescribed. Labs ordered and reviewed. Colonoscopy  done in 2021 repeat in 2026. Dexa scan 2019  repeat in next 1-2 years. MGM July 2021 repeat in next 1-2 years.       Diverticulosis    Has had several short episodes over past few months that responded to Augmentin. Is allowed a refill to use prn when her symptoms flare. Also allowed some Hyoscyamine to try prn when cramping occurs      Palpitations   Relevant Orders   CBC with Differential/Platelet (Completed)   TSH (Completed)   Obesity    Maintain heart healthy diet, decrease po intake and increase exercise as tolerated. Needs 7-8 hours of sleep nightly. Avoid trans fats, eat small, frequent meals every 4-5 hours with lean proteins, complex carbs and healthy fats. Minimize simple carbs      Chronic left-sided thoracic back pain    Other Visit Diagnoses    Tremor       Relevant Orders   CBC with Differential/Platelet (Completed)   Hyperlipidemia, mild       Relevant Medications   lovastatin (MEVACOR) 40 MG tablet   tretinoin (RETIN-A) 0.1 % cream      Follow-up: Return in about 1 year (around 09/19/2021) for annual exam.   I,David Hanna,acting as a scribe for Penni Homans, MD.,have documented all relevant documentation on the behalf of Penni Homans, MD,as directed by  Penni Homans, MD while in the presence of Penni Homans, MD.  I, Mosie Lukes, MD personally performed the services described in this documentation. All medical record entries made by the scribe were at my direction and in my presence. I have reviewed the chart and agree that the record reflects my personal performance and is accurate and complete

## 2020-09-20 ENCOUNTER — Other Ambulatory Visit: Payer: Self-pay | Admitting: Family Medicine

## 2020-09-20 LAB — COMPREHENSIVE METABOLIC PANEL
AG Ratio: 1.7 (calc) (ref 1.0–2.5)
ALT: 13 U/L (ref 6–29)
AST: 18 U/L (ref 10–35)
Albumin: 4.5 g/dL (ref 3.6–5.1)
Alkaline phosphatase (APISO): 44 U/L (ref 37–153)
BUN/Creatinine Ratio: 20 (calc) (ref 6–22)
BUN: 26 mg/dL — ABNORMAL HIGH (ref 7–25)
CO2: 29 mmol/L (ref 20–32)
Calcium: 10 mg/dL (ref 8.6–10.4)
Chloride: 101 mmol/L (ref 98–110)
Creat: 1.31 mg/dL — ABNORMAL HIGH (ref 0.60–0.93)
Globulin: 2.6 g/dL (calc) (ref 1.9–3.7)
Glucose, Bld: 120 mg/dL — ABNORMAL HIGH (ref 65–99)
Potassium: 4.1 mmol/L (ref 3.5–5.3)
Sodium: 140 mmol/L (ref 135–146)
Total Bilirubin: 0.3 mg/dL (ref 0.2–1.2)
Total Protein: 7.1 g/dL (ref 6.1–8.1)

## 2020-09-20 LAB — LIPID PANEL
Cholesterol: 181 mg/dL (ref <200)
HDL: 52 mg/dL (ref >=50)
LDL Cholesterol (Calc): 91 mg/dL (calc)
Non-HDL Cholesterol (Calc): 129 mg/dL (calc) (ref <130)
Total CHOL/HDL Ratio: 3.5 (calc) (ref <5.0)
Triglycerides: 290 mg/dL — ABNORMAL HIGH (ref <150)

## 2020-09-20 LAB — HEMOGLOBIN A1C
Hgb A1c MFr Bld: 5.6 % of total Hgb (ref <5.7)
Mean Plasma Glucose: 114 mg/dL
eAG (mmol/L): 6.3 mmol/L

## 2020-09-20 LAB — CBC WITH DIFFERENTIAL/PLATELET
Absolute Monocytes: 487 cells/uL (ref 200–950)
Basophils Absolute: 29 cells/uL (ref 0–200)
Basophils Relative: 0.5 %
Eosinophils Absolute: 122 cells/uL (ref 15–500)
Eosinophils Relative: 2.1 %
HCT: 37.3 % (ref 35.0–45.0)
Hemoglobin: 12.4 g/dL (ref 11.7–15.5)
Lymphs Abs: 2210 cells/uL (ref 850–3900)
MCH: 31.6 pg (ref 27.0–33.0)
MCHC: 33.2 g/dL (ref 32.0–36.0)
MCV: 95.2 fL (ref 80.0–100.0)
MPV: 11.7 fL (ref 7.5–12.5)
Monocytes Relative: 8.4 %
Neutro Abs: 2952 cells/uL (ref 1500–7800)
Neutrophils Relative %: 50.9 %
Platelets: 217 10*3/uL (ref 140–400)
RBC: 3.92 10*6/uL (ref 3.80–5.10)
RDW: 12.6 % (ref 11.0–15.0)
Total Lymphocyte: 38.1 %
WBC: 5.8 10*3/uL (ref 3.8–10.8)

## 2020-09-20 LAB — TSH: TSH: 3.09 mIU/L (ref 0.40–4.50)

## 2020-09-20 NOTE — Assessment & Plan Note (Signed)
Maintain heart healthy diet, decrease po intake and increase exercise as tolerated. Needs 7-8 hours of sleep nightly. Avoid trans fats, eat small, frequent meals every 4-5 hours with lean proteins, complex carbs and healthy fats. Minimize simple carbs

## 2020-09-20 NOTE — Assessment & Plan Note (Signed)
Encouraged heart healthy diet, increase exercise, avoid trans fats, consider a krill oil cap daily 

## 2020-09-20 NOTE — Assessment & Plan Note (Signed)
hgba1c acceptable, minimize simple carbs. Increase exercise as tolerated.  

## 2020-09-20 NOTE — Assessment & Plan Note (Addendum)
Patient encouraged to maintain heart healthy diet, regular exercise, adequate sleep. Consider daily probiotics. Take medications as prescribed. Labs ordered and reviewed. Colonoscopy done in 2021 repeat in 2026. Dexa scan 2019 repeat in next 1-2 years. MGM July 2021 repeat in next 1-2 years.

## 2020-09-20 NOTE — Assessment & Plan Note (Signed)
Has had several short episodes over past few months that responded to Augmentin. Is allowed a refill to use prn when her symptoms flare. Also allowed some Hyoscyamine to try prn when cramping occurs

## 2020-09-20 NOTE — Assessment & Plan Note (Signed)
Well controlled, no changes to meds. Encouraged heart healthy diet such as the DASH diet and exercise as tolerated.  °

## 2020-10-17 DIAGNOSIS — I6521 Occlusion and stenosis of right carotid artery: Secondary | ICD-10-CM | POA: Diagnosis not present

## 2020-10-17 DIAGNOSIS — Z95828 Presence of other vascular implants and grafts: Secondary | ICD-10-CM | POA: Diagnosis not present

## 2020-10-18 DIAGNOSIS — Z7982 Long term (current) use of aspirin: Secondary | ICD-10-CM | POA: Diagnosis not present

## 2020-10-18 DIAGNOSIS — Z8673 Personal history of transient ischemic attack (TIA), and cerebral infarction without residual deficits: Secondary | ICD-10-CM | POA: Diagnosis not present

## 2020-10-18 DIAGNOSIS — E785 Hyperlipidemia, unspecified: Secondary | ICD-10-CM | POA: Diagnosis not present

## 2020-10-18 DIAGNOSIS — I6523 Occlusion and stenosis of bilateral carotid arteries: Secondary | ICD-10-CM | POA: Diagnosis not present

## 2020-10-18 DIAGNOSIS — Z87891 Personal history of nicotine dependence: Secondary | ICD-10-CM | POA: Diagnosis not present

## 2020-10-18 DIAGNOSIS — I10 Essential (primary) hypertension: Secondary | ICD-10-CM | POA: Diagnosis not present

## 2020-12-05 DIAGNOSIS — H524 Presbyopia: Secondary | ICD-10-CM | POA: Diagnosis not present

## 2020-12-27 ENCOUNTER — Ambulatory Visit: Payer: Medicare HMO | Admitting: Gastroenterology

## 2021-01-29 ENCOUNTER — Encounter: Payer: Self-pay | Admitting: Family Medicine

## 2021-01-30 ENCOUNTER — Encounter: Payer: Self-pay | Admitting: Nurse Practitioner

## 2021-01-30 ENCOUNTER — Telehealth (INDEPENDENT_AMBULATORY_CARE_PROVIDER_SITE_OTHER): Payer: Medicare HMO | Admitting: Nurse Practitioner

## 2021-01-30 ENCOUNTER — Other Ambulatory Visit: Payer: Self-pay | Admitting: Nurse Practitioner

## 2021-01-30 VITALS — BP 120/62 | Temp 98.8°F | Ht 61.0 in | Wt 181.0 lb

## 2021-01-30 DIAGNOSIS — U071 COVID-19: Secondary | ICD-10-CM

## 2021-01-30 DIAGNOSIS — J9801 Acute bronchospasm: Secondary | ICD-10-CM | POA: Diagnosis not present

## 2021-01-30 MED ORDER — ALBUTEROL SULFATE HFA 108 (90 BASE) MCG/ACT IN AERS: 2.0000 | INHALATION_SPRAY | Freq: Four times a day (QID) | RESPIRATORY_TRACT | 0 refills | Status: DC | PRN

## 2021-01-30 MED ORDER — HYDROCODONE BIT-HOMATROP MBR 5-1.5 MG/5ML PO SOLN: 5.0000 mL | Freq: Three times a day (TID) | ORAL | 0 refills | Status: DC | PRN

## 2021-01-30 MED ORDER — NIRMATRELVIR/RITONAVIR (PAXLOVID) TABLET (RENAL DOSING): 2.0000 | ORAL_TABLET | Freq: Two times a day (BID) | ORAL | 0 refills | Status: DC

## 2021-01-30 NOTE — Patient Instructions (Signed)
Hold lovastatin while taking paxlovid Take paxlovid with food Use tylenol for headache or pain or fever >101 Maintain adequate oral hydration and small frequent meals. Call office if rebound covid symptoms after completion of paxlovid Go to ED if you develop chest pain, shortness of breath or palpitations or worsening fatigue.  COVID-19: What to Do if You Are Sick If you test positive and are an older adult or someone who is at high risk of getting very sick from COVID-19, treatment may be available. Contact a healthcare provider right away after a positive test to determine if you are eligible, even if your symptoms are mild right now. You can also visit a Test to Treat location and, if eligible, receive a prescription from a provider. Don't delay: Treatment must be started within the first few days to be effective. If you have a fever, cough, or other symptoms, you might have COVID-19. Most people have mild illness and are able to recover at home. If you are sick: Keep track of your symptoms. If you have an emergency warning sign (including trouble breathing), call 911. Steps to help prevent the spread of COVID-19 if you are sick If you are sick with COVID-19 or think you might have COVID-19, follow the steps below to care for yourself and to help protect other people in your home and community. Stay home except to get medical care Stay home. Most people with COVID-19 have mild illness and can recover at home without medical care. Do not leave your home, except to get medical care. Do not visit public areas and do not go to places where you are unable to wear a mask. Take care of yourself. Get rest and stay hydrated. Take over-the-counter medicines, such as acetaminophen, to help you feel better. Stay in touch with your doctor. Call before you get medical care. Be sure to get care if you have trouble breathing, or have any other emergency warning signs, or if you think it is an emergency. Avoid  public transportation, ride-sharing, or taxis if possible. Get tested If you have symptoms of COVID-19, get tested. While waiting for test results, stay away from others, including staying apart from those living in your household. Get tested as soon as possible after your symptoms start. Treatments may be available for people with COVID-19 who are at risk for becoming very sick. Don't delay: Treatment must be started early to be effective--some treatments must begin within 5 days of your first symptoms. Contact your healthcare provider right away if your test result is positive to determine if you are eligible. Self-tests are one of several options for testing for the virus that causes COVID-19 and may be more convenient than laboratory-based tests and point-of-care tests. Ask your healthcare provider or your local health department if you need help interpreting your test results. You can visit your state, tribal, local, and territorial health department's website to look for the latest local information on testing sites. Separate yourself from other people As much as possible, stay in a specific room and away from other people and pets in your home. If possible, you should use a separate bathroom. If you need to be around other people or animals in or outside of the home, wear a well-fitting mask. Tell your close contacts that they may have been exposed to COVID-19. An infected person can spread COVID-19 starting 48 hours (or 2 days) before the person has any symptoms or tests positive. By letting your close contacts know they may  have been exposed to COVID-19, you are helping to protect everyone. See COVID-19 and Animals if you have questions about pets. If you are diagnosed with COVID-19, someone from the health department may call you. Answer the call to slow the spread. Monitor your symptoms Symptoms of COVID-19 include fever, cough, or other symptoms. Follow care instructions from your healthcare  provider and local health department. Your local health authorities may give instructions on checking your symptoms and reporting information. When to seek emergency medical attention Look for emergency warning signs* for COVID-19. If someone is showing any of these signs, seek emergency medical care immediately: Trouble breathing Persistent pain or pressure in the chest New confusion Inability to wake or stay awake Pale, gray, or blue-colored skin, lips, or nail beds, depending on skin tone *This list is not all possible symptoms. Please call your medical provider for any other symptoms that are severe or concerning to you. Call 911 or call ahead to your local emergency facility: Notify the operator that you are seeking care for someone who has or may have COVID-19. Call ahead before visiting your doctor Call ahead. Many medical visits for routine care are being postponed or done by phone or telemedicine. If you have a medical appointment that cannot be postponed, call your doctor's office, and tell them you have or may have COVID-19. This will help the office protect themselves and other patients. If you are sick, wear a well-fitting mask You should wear a mask if you must be around other people or animals, including pets (even at home). Wear a mask with the best fit, protection, and comfort for you. You don't need to wear the mask if you are alone. If you can't put on a mask (because of trouble breathing, for example), cover your coughs and sneezes in some other way. Try to stay at least 6 feet away from other people. This will help protect the people around you. Masks should not be placed on young children under age 29 years, anyone who has trouble breathing, or anyone who is not able to remove the mask without help. Cover your coughs and sneezes Cover your mouth and nose with a tissue when you cough or sneeze. Throw away used tissues in a lined trash can. Immediately wash your hands with  soap and water for at least 20 seconds. If soap and water are not available, clean your hands with an alcohol-based hand sanitizer that contains at least 60% alcohol. Clean your hands often Wash your hands often with soap and water for at least 20 seconds. This is especially important after blowing your nose, coughing, or sneezing; going to the bathroom; and before eating or preparing food. Use hand sanitizer if soap and water are not available. Use an alcohol-based hand sanitizer with at least 60% alcohol, covering all surfaces of your hands and rubbing them together until they feel dry. Soap and water are the best option, especially if hands are visibly dirty. Avoid touching your eyes, nose, and mouth with unwashed hands. Handwashing Tips Avoid sharing personal household items Do not share dishes, drinking glasses, cups, eating utensils, towels, or bedding with other people in your home. Wash these items thoroughly after using them with soap and water or put in the dishwasher. Clean surfaces in your home regularly Clean and disinfect high-touch surfaces (for example, doorknobs, tables, handles, light switches, and countertops) in your "sick room" and bathroom. In shared spaces, you should clean and disinfect surfaces and items after each use by  the person who is ill. If you are sick and cannot clean, a caregiver or other person should only clean and disinfect the area around you (such as your bedroom and bathroom) on an as needed basis. Your caregiver/other person should wait as long as possible (at least several hours) and wear a mask before entering, cleaning, and disinfecting shared spaces that you use. Clean and disinfect areas that may have blood, stool, or body fluids on them. Use household cleaners and disinfectants. Clean visible dirty surfaces with household cleaners containing soap or detergent. Then, use a household disinfectant. Use a product from [employer] List N: Disinfectants for  Coronavirus ([id number]). Be sure to follow the instructions on the label to ensure safe and effective use of the product. Many products recommend keeping the surface wet with a disinfectant for a certain period of time (look at "contact time" on the product label). You may also need to wear personal protective equipment, such as gloves, depending on the directions on the product label. Immediately after disinfecting, wash your hands with soap and water for 20 seconds. For completed guidance on cleaning and disinfecting your home, visit Complete Disinfection Guidance. Take steps to improve ventilation at home Improve ventilation (air flow) at home to help prevent from spreading COVID-19 to other people in your household. Clear out COVID-19 virus particles in the air by opening windows, using air filters, and turning on fans in your home. Use this interactive tool to learn how to improve air flow in your home. When you can be around others after being sick with COVID-19 Deciding when you can be around others is different for different situations. Find out when you can safely end home isolation. For any additional questions about your care, contact your healthcare provider or state or local health department. 08/27/2020 Content source: [facility] for Immunization and Respiratory Diseases ([facility]), Division of Viral Diseases This information is not intended to replace advice given to you by your health care provider. Make sure you discuss any questions you have with your health care provider. Document Revised: 10/10/2020 Document Reviewed: 10/10/2020 Elsevier Patient Education  [place].

## 2021-01-30 NOTE — Telephone Encounter (Signed)
Appt scheduled w/ Dr. Lorayne Marek today

## 2021-01-30 NOTE — Progress Notes (Signed)
Virtual Visit via Video Note  I connected withNAME@ on 01/30/21 at  2:00 PM EDT by a video enabled telemedicine application and verified that I am speaking with the correct person using two identifiers.  Location: Patient:Home Provider: Office Participants: patient and provider  I discussed the limitations of evaluation and management by telemedicine and the availability of in person appointments. I also discussed with the patient that there may be a patient responsible charge related to this service. The patient expressed understanding and agreed to proceed.  CC:URI x 2days  History of Present Illness: She has received 3doses of COVID vaccine, last dose 02/2020. She travel by plane to [place], returned 3days ago. Positive home COVID antigen test yesterday Cough This is a new problem. The current episode started in the past 7 days. The problem has been unchanged. The cough is Non-productive. Associated symptoms include chills, a fever, myalgias, nasal congestion, postnasal drip, rhinorrhea and wheezing. Pertinent negatives include no chest pain, headaches, heartburn, sore throat, shortness of breath, sweats or weight loss. The symptoms are aggravated by lying down. She has tried prescription cough suppressant for the symptoms. The treatment provided significant relief. Her past medical history is significant for bronchitis. There is no history of asthma, bronchiectasis, COPD, emphysema, environmental allergies or pneumonia.     BMP Latest Ref Rng & Units 09/19/2020 10/06/2019 07/25/2019  Glucose 65 - 99 mg/dL 120(H) 119(H) 96  BUN 7 - 25 mg/dL 26(H) 20 24(H)  Creatinine 0.60 - 0.93 mg/dL 1.31(H) 1.01(H) 0.92  BUN/Creat Ratio 6 - 22 (calc) 20 - -  Sodium 135 - 146 mmol/L 140 137 138  Potassium 3.5 - 5.3 mmol/L 4.1 3.6 4.2  Chloride 98 - 110 mmol/L 101 100 100  CO2 20 - 32 mmol/L '29 26 31  '$ Calcium 8.6 - 10.4 mg/dL 10.0 9.7 10.2    Estimated Gfr:  50.63m/min  Observations/Objective: Physical Exam Vitals reviewed.  Constitutional:      General: She is not in acute distress. Pulmonary:     Effort: Pulmonary effort is normal.  Neurological:     Mental Status: She is alert and oriented to person, place, and time.    Assessment and Plan: [place] seen today for covid positive.  Diagnoses and all orders for this visit:  COVID-19 -     albuterol (VENTOLIN HFA) 108 (90 Base) MCG/ACT inhaler; Inhale 2 puffs into the lungs every 6 (six) hours as needed for wheezing or shortness of breath. -     nirmatrelvir/ritonavir EUA, renal dosing, (PAXLOVID) 10 x 150 MG & 10 x '100MG'$  TABS; Take 2 tablets by mouth 2 (two) times daily for 5 days. (Take nirmatrelvir 150 mg one tablet twice daily for 5 days and ritonavir 100 mg one tablet twice daily for 5 days) Patient GFR is 50.343mmin -     HYDROcodone bit-homatropine (HYCODAN) 5-1.5 MG/5ML syrup; Take 5 mLs by mouth every 8 (eight) hours as needed for cough.  Cough due to bronchospasm -     albuterol (VENTOLIN HFA) 108 (90 Base) MCG/ACT inhaler; Inhale 2 puffs into the lungs every 6 (six) hours as needed for wheezing or shortness of breath. -     nirmatrelvir/ritonavir EUA, renal dosing, (PAXLOVID) 10 x 150 MG & 10 x '100MG'$  TABS; Take 2 tablets by mouth 2 (two) times daily for 5 days. (Take nirmatrelvir 150 mg one tablet twice daily for 5 days and ritonavir 100 mg one tablet twice daily for 5 days) Patient GFR is 50.3174min -  HYDROcodone bit-homatropine (HYCODAN) 5-1.5 MG/5ML syrup; Take 5 mLs by mouth every 8 (eight) hours as needed for cough.  Follow Up Instructions: Hold lovastatin while taking paxlovid Take paxlovid with food Use tylenol for headache or pain or fever >101 Maintain adequate oral hydration and small frequent meals. Call office if rebound covid symptoms after completion of paxlovid. Go to ED if you develop chest pain, shortness of breath or palpitations or worsening  fatigue.   I discussed the assessment and treatment plan with the patient. The patient was provided an opportunity to ask questions and all were answered. The patient agreed with the plan and demonstrated an understanding of the instructions.   The patient was advised to call back or seek an in-person evaluation if the symptoms worsen or if the condition fails to improve as anticipated.  Wilfred Lacy, NP

## 2021-01-31 NOTE — Telephone Encounter (Signed)
Please see message on medication from pharmacy and advise.   Thanks.  Dm/cma

## 2021-02-01 DIAGNOSIS — I10 Essential (primary) hypertension: Secondary | ICD-10-CM | POA: Diagnosis not present

## 2021-02-01 DIAGNOSIS — Z87891 Personal history of nicotine dependence: Secondary | ICD-10-CM | POA: Diagnosis not present

## 2021-02-01 DIAGNOSIS — M549 Dorsalgia, unspecified: Secondary | ICD-10-CM | POA: Diagnosis not present

## 2021-02-01 DIAGNOSIS — R070 Pain in throat: Secondary | ICD-10-CM | POA: Diagnosis not present

## 2021-02-01 DIAGNOSIS — R0602 Shortness of breath: Secondary | ICD-10-CM | POA: Diagnosis not present

## 2021-02-01 DIAGNOSIS — U071 COVID-19: Secondary | ICD-10-CM | POA: Diagnosis not present

## 2021-02-01 DIAGNOSIS — N179 Acute kidney failure, unspecified: Secondary | ICD-10-CM | POA: Diagnosis not present

## 2021-02-01 DIAGNOSIS — Z743 Need for continuous supervision: Secondary | ICD-10-CM | POA: Diagnosis not present

## 2021-02-01 DIAGNOSIS — E669 Obesity, unspecified: Secondary | ICD-10-CM | POA: Diagnosis not present

## 2021-02-01 DIAGNOSIS — N390 Urinary tract infection, site not specified: Secondary | ICD-10-CM | POA: Diagnosis not present

## 2021-02-01 DIAGNOSIS — R0902 Hypoxemia: Secondary | ICD-10-CM | POA: Diagnosis not present

## 2021-02-01 DIAGNOSIS — R300 Dysuria: Secondary | ICD-10-CM | POA: Diagnosis not present

## 2021-02-01 DIAGNOSIS — E860 Dehydration: Secondary | ICD-10-CM | POA: Diagnosis not present

## 2021-02-01 DIAGNOSIS — Z6834 Body mass index (BMI) 34.0-34.9, adult: Secondary | ICD-10-CM | POA: Diagnosis not present

## 2021-02-01 DIAGNOSIS — E785 Hyperlipidemia, unspecified: Secondary | ICD-10-CM | POA: Diagnosis not present

## 2021-02-01 DIAGNOSIS — G4733 Obstructive sleep apnea (adult) (pediatric): Secondary | ICD-10-CM | POA: Diagnosis not present

## 2021-02-01 MED ORDER — NIRMATRELVIR/RITONAVIR (PAXLOVID)TABLET
ORAL_TABLET | ORAL | 0 refills | Status: DC
Start: 2021-02-01 — End: 2021-04-28

## 2021-02-02 DIAGNOSIS — E669 Obesity, unspecified: Secondary | ICD-10-CM | POA: Diagnosis not present

## 2021-02-02 DIAGNOSIS — Z6834 Body mass index (BMI) 34.0-34.9, adult: Secondary | ICD-10-CM | POA: Diagnosis not present

## 2021-02-02 DIAGNOSIS — U071 COVID-19: Secondary | ICD-10-CM | POA: Diagnosis not present

## 2021-02-02 DIAGNOSIS — E785 Hyperlipidemia, unspecified: Secondary | ICD-10-CM | POA: Diagnosis not present

## 2021-02-02 DIAGNOSIS — R300 Dysuria: Secondary | ICD-10-CM | POA: Diagnosis not present

## 2021-02-02 DIAGNOSIS — D72829 Elevated white blood cell count, unspecified: Secondary | ICD-10-CM | POA: Diagnosis not present

## 2021-02-02 DIAGNOSIS — G4733 Obstructive sleep apnea (adult) (pediatric): Secondary | ICD-10-CM | POA: Diagnosis not present

## 2021-02-02 DIAGNOSIS — Z87891 Personal history of nicotine dependence: Secondary | ICD-10-CM | POA: Diagnosis not present

## 2021-02-02 DIAGNOSIS — N179 Acute kidney failure, unspecified: Secondary | ICD-10-CM | POA: Diagnosis not present

## 2021-02-02 DIAGNOSIS — I10 Essential (primary) hypertension: Secondary | ICD-10-CM | POA: Diagnosis not present

## 2021-02-03 ENCOUNTER — Telehealth: Payer: Self-pay

## 2021-02-03 DIAGNOSIS — R079 Chest pain, unspecified: Secondary | ICD-10-CM | POA: Diagnosis not present

## 2021-02-03 DIAGNOSIS — D72829 Elevated white blood cell count, unspecified: Secondary | ICD-10-CM | POA: Diagnosis not present

## 2021-02-03 DIAGNOSIS — N3000 Acute cystitis without hematuria: Secondary | ICD-10-CM | POA: Diagnosis not present

## 2021-02-03 DIAGNOSIS — U071 COVID-19: Secondary | ICD-10-CM | POA: Diagnosis not present

## 2021-02-03 DIAGNOSIS — N179 Acute kidney failure, unspecified: Secondary | ICD-10-CM | POA: Diagnosis not present

## 2021-02-03 DIAGNOSIS — B9620 Unspecified Escherichia coli [E. coli] as the cause of diseases classified elsewhere: Secondary | ICD-10-CM | POA: Diagnosis not present

## 2021-02-03 NOTE — Telephone Encounter (Signed)
Patient evaluated/treated in ER.   [facility] Night - Client TELEPHONE ADVICE RECORD AccessNurse Patient Name:[name] Gender: Female DOB: 05/22/1951 Age: 70 Y 26 M 16 D Return Phone [phone]) [facility] - Client Client Site [facility] - Night Physician Penni Homans - MD Contact Type Call Who Is Calling Patient / Member / Family / Caregiver Call Type Triage / Clinical Relationship To Patient Self Return Phone Number [phone] (Primary) Chief Complaint BREATHING - fast, heavy or wheezing Reason for Call Symptomatic / Request for Health Information Initial Comment Caller states she has covid, coughing, vomiting, muscle pain, prescribed albuterol to help with breathing but makes her jittery, Translation No Nurse Assessment Nurse: Della Goo, RN, Vicente Males Date/Time (Eastern Time): 02/01/2021 8:20:56 AM Confirm and document reason for call. If symptomatic, describe symptoms. ---Caller states she tested pos for covid last tuesday, symptoms include coughing, vomiting, muscle pain, prescribed albuterol to help with breathing but makes her jittery. current temp 99.3 (oral). has been taking advil. Does the patient have any new or worsening symptoms? ---Yes Will a triage be completed? ---Yes Related visit to physician within the last 2 weeks? ---Yes Does the PT have any chronic conditions? (i.e. diabetes, asthma, this includes High risk factors for pregnancy, etc.) ---No Is this a behavioral health or substance abuse call? ---No Guidelines Guideline Title Affirmed Question Affirmed Notes Nurse Date/Time ([place] Time) COVID-19 - Diagnosed or Suspected Difficult to awaken or acting confused (e.g., disoriented, slurred speech) Quandt, [place], Vicente Males 02/01/2021 8:22:59 AM Disp. Time Eilene Ghazi Time) Disposition Final User 02/01/2021 8:19:29 AM Send to Urgent Queue Dalia Heading 02/01/2021  8:30:40 AM 911 Outcome Documentation Quandt, RN, Vicente Males PLEASE NOTE: All timestamps contained within this report are represented as [place] Standard Time. CONFIDENTIALTY NOTICE: This fax transmission is intended only for the addressee. It contains information that is legally privileged, confidential or otherwise protected from use or disclosure. If you are not the intended recipient, you are strictly prohibited from reviewing, disclosing, copying using or disseminating any of this information or taking any action in reliance on or regarding this information. If you have received this fax in error, please notify us immediately by telephone so that we can arrange for its return to [place]. Phone: [phone], Toll-Free: [phone], Fax: [phone] Page: 2 of 2 Call Id: [id number] [place]. Time (Eastern Time) Disposition Final User Reason: caller states she will call ems but needs an additional 60mn 02/01/2021 8:24:36 AM Call EMS 911 Now Yes Quandt, RN, AAllegra LaiDisagree/Comply Comply Caller Understands Yes PreDisposition InappropriateToAsk Care Advice Given Per Guideline CALL EMS 911 NOW: * Immediate medical attention is needed. You need to hang up and call 911 (or an ambulance). CARE ADVICE given per COVID-19 - DIAGNOSED OR SUSPECTED (Adult) guideline. * Triager Discretion: I'll call you back in a few minutes to be sure you were able to reach them. Referrals GO TO FACILITY UNDECIDE

## 2021-02-05 DIAGNOSIS — D72829 Elevated white blood cell count, unspecified: Secondary | ICD-10-CM | POA: Diagnosis not present

## 2021-02-05 DIAGNOSIS — N179 Acute kidney failure, unspecified: Secondary | ICD-10-CM | POA: Diagnosis not present

## 2021-02-07 DIAGNOSIS — U071 COVID-19: Secondary | ICD-10-CM | POA: Diagnosis not present

## 2021-02-07 DIAGNOSIS — N179 Acute kidney failure, unspecified: Secondary | ICD-10-CM | POA: Diagnosis not present

## 2021-02-09 ENCOUNTER — Other Ambulatory Visit: Payer: Self-pay | Admitting: Nurse Practitioner

## 2021-02-09 DIAGNOSIS — J9801 Acute bronchospasm: Secondary | ICD-10-CM

## 2021-02-09 DIAGNOSIS — U071 COVID-19: Secondary | ICD-10-CM

## 2021-02-10 ENCOUNTER — Other Ambulatory Visit: Payer: Self-pay | Admitting: Nurse Practitioner

## 2021-02-10 DIAGNOSIS — J9801 Acute bronchospasm: Secondary | ICD-10-CM

## 2021-02-10 DIAGNOSIS — U071 COVID-19: Secondary | ICD-10-CM

## 2021-02-11 ENCOUNTER — Other Ambulatory Visit: Payer: Self-pay | Admitting: Family Medicine

## 2021-02-11 ENCOUNTER — Encounter: Payer: Self-pay | Admitting: Family Medicine

## 2021-02-11 DIAGNOSIS — U071 COVID-19: Secondary | ICD-10-CM

## 2021-02-11 DIAGNOSIS — J9801 Acute bronchospasm: Secondary | ICD-10-CM

## 2021-02-11 MED ORDER — HYDROCODONE BIT-HOMATROP MBR 5-1.5 MG/5ML PO SOLN: 5.0000 mL | Freq: Three times a day (TID) | ORAL | 0 refills | Status: DC | PRN

## 2021-02-11 NOTE — Telephone Encounter (Signed)
Pt has been out for 2 days and still has an awful cough. She is requesting refill and has f/u appt with Dr. Charlett Blake on 9/8.

## 2021-02-13 ENCOUNTER — Other Ambulatory Visit: Payer: Self-pay

## 2021-02-13 ENCOUNTER — Telehealth (INDEPENDENT_AMBULATORY_CARE_PROVIDER_SITE_OTHER): Payer: Medicare HMO | Admitting: Family Medicine

## 2021-02-13 DIAGNOSIS — I10 Essential (primary) hypertension: Secondary | ICD-10-CM

## 2021-02-13 DIAGNOSIS — U071 COVID-19: Secondary | ICD-10-CM

## 2021-02-13 DIAGNOSIS — K219 Gastro-esophageal reflux disease without esophagitis: Secondary | ICD-10-CM | POA: Diagnosis not present

## 2021-02-13 DIAGNOSIS — R002 Palpitations: Secondary | ICD-10-CM

## 2021-02-13 DIAGNOSIS — J9801 Acute bronchospasm: Secondary | ICD-10-CM | POA: Diagnosis not present

## 2021-02-13 DIAGNOSIS — N390 Urinary tract infection, site not specified: Secondary | ICD-10-CM | POA: Diagnosis not present

## 2021-02-13 DIAGNOSIS — E739 Lactose intolerance, unspecified: Secondary | ICD-10-CM | POA: Diagnosis not present

## 2021-02-13 MED ORDER — HYDROCODONE BIT-HOMATROP MBR 5-1.5 MG/5ML PO SOLN: 5.0000 mL | Freq: Three times a day (TID) | ORAL | 0 refills | Status: DC | PRN

## 2021-02-13 NOTE — Progress Notes (Signed)
MyChart Video Visit    Virtual Visit via Video Note   This visit type was conducted due to national recommendations for restrictions regarding the COVID-19 Pandemic (e.g. social distancing) in an effort to limit this patient's exposure and mitigate transmission in our community. This patient is at least at moderate risk for complications without adequate follow up. This format is felt to be most appropriate for this patient at this time. Physical exam was limited by quality of the video and audio technology used for the visit. S. Chism, CMA was able to get the patient set up on a video visit.  Patient location: Home Patient and provider in visit Provider location: Office  I discussed the limitations of evaluation and management by telemedicine and the availability of in person appointments. The patient expressed understanding and agreed to proceed.  Visit Date: 02/16/21  Today's healthcare provider: Penni Homans, MD     Subjective:    Patient ID: [name], female    DOB: 1951/05/31, 70 y.o.   MRN: [id number]  No chief complaint on file.   HPI Patient is in today for video visit for follow up on HTN and recent covid. She is on day 6 now with testing negative for covid and has a couple of days to quarantine remaining. She is still experiencing chills, fatigue, decreased appetite, thirst, productive coughs, and minimal diarrhea alternating with constipation. Denies SOB/HA/congestion/fevers or GU c/o. Taking meds as prescribed. She takes fiber supplements everyday.  While sleeping yesterday she experienced CP and palpations to the point that she was woken up. She measured her bpm and O2 with pulseox and they were in the lower 100s bpm and 90%.  She does not use her albuterol px because it makes her shaky and she did not notice a significant difference by using it.   Past Medical History:  Diagnosis Date   Arthritis    Benign paroxysmal positional vertigo 11/14/2013   Colon  polyp    Depression    Diverticulosis    Essential hypertension, benign    GERD (gastroesophageal reflux disease) 09/02/2013   GI bleed    Glaucoma    Glucose intolerance (impaired glucose tolerance)    H/O tobacco use, presenting hazards to health 09/02/2013   History  of basal cell carcinoma    History of cardiac arrhythmia    Hyperlipidemia    Hypertension    Medicare welcome visit 11/28/2012   Obesity 02/08/2017   Palpitations 02/04/2017   Personal history of colonic polyps 09/02/2013   Patient reports H/O 2 colonoscopies, last one dates back to 2012 in [place] She reports they were labeled as incomplete.  Reports 1st colonoscopy had 1 benign polyp and the 2nd was clear.    Sleep apnea 10/01/2016   Not on CPAP machine    Stroke [facility])    UTI (urinary tract infection)     Past Surgical History:  Procedure Laterality Date   CAROTID STENT     CERVICAL CONE BIOPSY  1960s   COLONOSCOPY  2016   DILATION AND CURETTAGE OF UTERUS  1966   KNEE SURGERY Left 11/01/2008   left knee total replacement Dr Lajean Saver TOOTH EXTRACTION      Family History  Problem Relation Age of Onset   Coronary artery disease Paternal Grandmother    Hyperlipidemia Paternal Grandmother    Hypertension Paternal Grandmother    Stroke Paternal Grandmother    Hypertension Mother    Lung cancer Mother  smoker   Uterine cancer Mother    Birth defects Mother        lung cancer in smoker   COPD Mother    Hypertension Father    Kidney disease Father        Renal Cell Carcinoma   Birth defects Father        renal cell CA, smoker   Hypertension Maternal Grandmother    Hypertension Maternal Grandfather    Hypertension Paternal Grandfather    Dementia Paternal Grandfather    Cancer Brother        testicular   Heart disease Brother        NI, smoker   Hyperlipidemia Other    Colon cancer Neg Hx    Esophageal cancer Neg Hx     Social History   Socioeconomic History   Marital status: Divorced     Spouse name: Not on file   Number of children: 0   Years of education: Not on file   Highest education level: Not on file  Occupational History   Occupation: Retired   Tobacco Use   Smoking status: Former    Packs/day: 1.00    Years: 15.00    Pack years: 15.00    Types: Cigarettes    Quit date: 06/08/1982    Years since quitting: 38.7   Smokeless tobacco: Never  Vaping Use   Vaping Use: Never used  Substance and Sexual Activity   Alcohol use: Yes    Comment: 1-2 glasses of wine per day   Drug use: No   Sexual activity: Not Currently    Birth control/protection: Post-menopausal    Comment: lives alone, no dietary restrictions except no lactose,  Other Topics Concern   Not on file  Social History Narrative   Occupation: unemployed Product manager- now retired   Divorced   no children    Moved from [place] alone with 4 cats   Enjoys gardening, planting.             Social Determinants of Health   Financial Resource Strain: Not on file  Food Insecurity: Not on file  Transportation Needs: Not on file  Physical Activity: Not on file  Stress: Not on file  Social Connections: Not on file  Intimate Partner Violence: Not on file    Outpatient Medications Prior to Visit  Medication Sig Dispense Refill   albuterol (VENTOLIN HFA) 108 (90 Base) MCG/ACT inhaler Inhale 2 puffs into the lungs every 6 (six) hours as needed for wheezing or shortness of breath. 8 g 0   aspirin 81 MG tablet Take 81 mg by mouth daily.     fish oil-omega-3 fatty acids 1000 MG capsule Take 1 g by mouth daily.      hydrochlorothiazide (MICROZIDE) 12.5 MG capsule Take 1 capsule by mouth once daily 90 capsule 1   hyoscyamine (ANASPAZ) 0.125 MG TBDP disintergrating tablet Place 1 tablet (0.125 mg total) under the tongue every 4 (four) hours as needed. 30 tablet 1   ibuprofen (ADVIL,MOTRIN) 200 MG tablet Take 400 mg by mouth every 6 (six) hours as needed for headache or moderate pain.       lovastatin (MEVACOR) 40 MG tablet Take 1 tablet (40 mg total) by mouth at bedtime. 90 tablet 3   metoprolol tartrate (LOPRESSOR) 50 MG tablet Take 1 tablet by mouth twice daily 180 tablet 1   Multiple Vitamins-Minerals (MULTIVITAMIN WOMEN PO) Take 1 tablet by mouth daily.  nirmatrelvir/ritonavir EUA (PAXLOVID) 20 x 150 MG & 10 x '100MG'$  TABS (Take nirmatrelvir 150 mg one tablets twice daily for 5 days and ritonavir 100 mg one tablet twice daily for 5 days) Patient GFR is 50.58m/min 20 tablet 0   Oxymetazoline HCl (NASAL SPRAY) 0.05 % SOLN Place into the nose.     Probiotic Product (PROBIOTIC DAILY PO) Take by mouth.     tretinoin (RETIN-A) 0.1 % cream APPLY TO AFFECTED AREA(S) ONE TIME DAILY AT BEDTIME USING PEA SIZED AMOUNT TO FACE 45 g 5   HYDROcodone bit-homatropine (HYCODAN) 5-1.5 MG/5ML syrup Take 5 mLs by mouth every 8 (eight) hours as needed for cough. 120 mL 0   No facility-administered medications prior to visit.    No Known Allergies  Review of Systems  Constitutional:  Positive for chills and malaise/fatigue. Negative for fever.  HENT:  Negative for congestion, sinus pain and sore throat.   Eyes:  Negative for blurred vision.  Respiratory:  Positive for cough. Negative for shortness of breath.   Cardiovascular:  Positive for chest pain and palpitations. Negative for leg swelling.  Gastrointestinal:  Positive for constipation and diarrhea (minimal). Negative for blood in stool, nausea and vomiting.  Genitourinary:  Negative for flank pain and frequency.  Musculoskeletal:  Negative for back pain.  Skin:  Negative for rash.  Neurological:  Positive for headaches.  Endo/Heme/Allergies:  Positive for polydipsia.      Objective:    Physical Exam Constitutional:      Appearance: Normal appearance.  HENT:     Head: Normocephalic and atraumatic.     Right Ear: External ear normal.     Left Ear: External ear normal.  Pulmonary:     Effort: Pulmonary effort is normal.   Musculoskeletal:        General: Normal range of motion.     Cervical back: Normal range of motion.  Skin:    General: Skin is dry.  Neurological:     Mental Status: She is alert and oriented to person, place, and time.  Psychiatric:        Behavior: Behavior normal.    There were no vitals taken for this visit. Wt Readings from Last 3 Encounters:  01/30/21 181 lb (82.1 kg)  09/19/20 191 lb 12.8 oz (87 kg)  12/27/19 181 lb (82.1 kg)    Diabetic Foot Exam - Simple   No data filed    Lab Results  Component Value Date   WBC 5.8 09/19/2020   HGB 12.4 09/19/2020   HCT 37.3 09/19/2020   PLT 217 09/19/2020   GLUCOSE 120 (H) 09/19/2020   CHOL 181 09/19/2020   TRIG 290 (H) 09/19/2020   HDL 52 09/19/2020   LDLDIRECT 131.8 10/10/2008   LDLCALC 91 09/19/2020   ALT 13 09/19/2020   AST 18 09/19/2020   NA 140 09/19/2020   K 4.1 09/19/2020   CL 101 09/19/2020   CREATININE 1.31 (H) 09/19/2020   BUN 26 (H) 09/19/2020   CO2 29 09/19/2020   TSH 3.09 09/19/2020   INR 1.1 10/06/2019   HGBA1C 5.6 09/19/2020    Lab Results  Component Value Date   TSH 3.09 09/19/2020   Lab Results  Component Value Date   WBC 5.8 09/19/2020   HGB 12.4 09/19/2020   HCT 37.3 09/19/2020   MCV 95.2 09/19/2020   PLT 217 09/19/2020   Lab Results  Component Value Date   NA 140 09/19/2020   K 4.1 09/19/2020   CO2  29 09/19/2020   GLUCOSE 120 (H) 09/19/2020   BUN 26 (H) 09/19/2020   CREATININE 1.31 (H) 09/19/2020   BILITOT 0.3 09/19/2020   ALKPHOS 31 (L) 10/06/2019   AST 18 09/19/2020   ALT 13 09/19/2020   PROT 7.1 09/19/2020   ALBUMIN 3.8 10/06/2019   CALCIUM 10.0 09/19/2020   ANIONGAP 11 10/06/2019   GFR 60.16 07/25/2019   Lab Results  Component Value Date   CHOL 181 09/19/2020   Lab Results  Component Value Date   HDL 52 09/19/2020   Lab Results  Component Value Date   LDLCALC 91 09/19/2020   Lab Results  Component Value Date   TRIG 290 (H) 09/19/2020   Lab Results   Component Value Date   CHOLHDL 3.5 09/19/2020   Lab Results  Component Value Date   HGBA1C 5.6 09/19/2020       Assessment & Plan:   Problem List Items Addressed This Visit     GLUCOSE INTOLERANCE    hgba1c acceptable, minimize simple carbs. Increase exercise as tolerated.       Essential hypertension, benign    Monitor and report any concerns, no changes to meds. Encouraged heart healthy diet such as the DASH diet and exercise as tolerated.      GERD (gastroesophageal reflux disease)    Avoid offending foods, start probiotics. Do not eat large meals in late evening and consider raising head of bed.       Palpitations    They work her from a sleep last night. Check labs and she will avoid stimulants and seek care if palpitations or CP return.      Relevant Orders   Comprehensive metabolic panel   Magnesium   COVID-19    She is greatly improved but still struggling with a cough at night may continue to use cough syrup prn      Relevant Medications   HYDROcodone bit-homatropine (HYCODAN) 5-1.5 MG/5ML syrup   Other Visit Diagnoses     Urinary tract infection without hematuria, site unspecified    -  Primary   Relevant Orders   Urinalysis   Urine Culture   Cough due to bronchospasm       Relevant Medications   HYDROcodone bit-homatropine (HYCODAN) 5-1.5 MG/5ML syrup         Meds ordered this encounter  Medications   HYDROcodone bit-homatropine (HYCODAN) 5-1.5 MG/5ML syrup    Sig: Take 5 mLs by mouth every 8 (eight) hours as needed for cough.    Dispense:  120 mL    Refill:  0     I discussed the assessment and treatment plan with the patient. The patient was provided an opportunity to ask questions and all were answered. The patient agreed with the plan and demonstrated an understanding of the instructions.   The patient was advised to call back or seek an in-person evaluation if the symptoms worsen or if the condition fails to improve as  anticipated.  I provided 22 minutes of face-to-face time during this encounter.   Penni Homans, MD [facility] [phone] (phone) [phone] (fax)  [place], Suezanne Jacquet, acting as a scribe for Penni Homans, MD, have documented all relevent documentation on behalf of Penni Homans, MD, as directed by Penni Homans, MD while in the presence of Penni Homans, MD. DO:02/16/21.  I, Mosie Lukes, MD personally performed the services described in this documentation. All medical record entries made  by the scribe were at my direction and in my presence. I have reviewed the chart and agree that the record reflects my personal performance and is accurate and complete

## 2021-02-16 DIAGNOSIS — U071 COVID-19: Secondary | ICD-10-CM | POA: Insufficient documentation

## 2021-02-16 NOTE — Assessment & Plan Note (Signed)
Monitor and report any concerns, no changes to meds. Encouraged heart healthy diet such as the DASH diet and exercise as tolerated.  ?

## 2021-02-16 NOTE — Assessment & Plan Note (Signed)
hgba1c acceptable, minimize simple carbs. Increase exercise as tolerated.  

## 2021-02-16 NOTE — Assessment & Plan Note (Signed)
Avoid offending foods, start probiotics. Do not eat large meals in late evening and consider raising head of bed.  

## 2021-02-16 NOTE — Assessment & Plan Note (Signed)
They work her from a sleep last night. Check labs and she will avoid stimulants and seek care if palpitations or CP return.

## 2021-02-16 NOTE — Assessment & Plan Note (Signed)
She is greatly improved but still struggling with a cough at night may continue to use cough syrup prn

## 2021-02-21 ENCOUNTER — Other Ambulatory Visit (INDEPENDENT_AMBULATORY_CARE_PROVIDER_SITE_OTHER): Payer: Medicare HMO

## 2021-02-21 ENCOUNTER — Other Ambulatory Visit: Payer: Self-pay | Admitting: Family Medicine

## 2021-02-21 ENCOUNTER — Other Ambulatory Visit: Payer: Self-pay

## 2021-02-21 DIAGNOSIS — R002 Palpitations: Secondary | ICD-10-CM | POA: Diagnosis not present

## 2021-02-21 DIAGNOSIS — N390 Urinary tract infection, site not specified: Secondary | ICD-10-CM | POA: Diagnosis not present

## 2021-02-21 DIAGNOSIS — N289 Disorder of kidney and ureter, unspecified: Secondary | ICD-10-CM

## 2021-02-21 LAB — URINALYSIS, ROUTINE W REFLEX MICROSCOPIC
Bilirubin Urine: NEGATIVE
Hgb urine dipstick: NEGATIVE
Ketones, ur: NEGATIVE
Nitrite: NEGATIVE
Specific Gravity, Urine: 1.02 (ref 1.000–1.030)
Total Protein, Urine: NEGATIVE
Urine Glucose: NEGATIVE
Urobilinogen, UA: 0.2 (ref 0.0–1.0)
pH: 6 (ref 5.0–8.0)

## 2021-02-21 LAB — COMPREHENSIVE METABOLIC PANEL
ALT: 12 U/L (ref 0–35)
AST: 15 U/L (ref 0–37)
Albumin: 3.8 g/dL (ref 3.5–5.2)
Alkaline Phosphatase: 50 U/L (ref 39–117)
BUN: 29 mg/dL — ABNORMAL HIGH (ref 6–23)
CO2: 27 mEq/L (ref 19–32)
Calcium: 10.2 mg/dL (ref 8.4–10.5)
Chloride: 100 mEq/L (ref 96–112)
Creatinine, Ser: 1.67 mg/dL — ABNORMAL HIGH (ref 0.40–1.20)
GFR: 30.38 mL/min — ABNORMAL LOW (ref >60.00)
Glucose, Bld: 100 mg/dL — ABNORMAL HIGH (ref 70–99)
Potassium: 4.4 mEq/L (ref 3.5–5.1)
Sodium: 137 mEq/L (ref 135–145)
Total Bilirubin: 0.6 mg/dL (ref 0.2–1.2)
Total Protein: 7.1 g/dL (ref 6.0–8.3)

## 2021-02-21 LAB — MAGNESIUM: Magnesium: 1.8 mg/dL (ref 1.5–2.5)

## 2021-02-21 MED ORDER — NITROFURANTOIN MONOHYD MACRO 100 MG PO CAPS: 100.0000 mg | ORAL_CAPSULE | Freq: Two times a day (BID) | ORAL | 0 refills | Status: DC

## 2021-02-23 LAB — URINE CULTURE
MICRO NUMBER:: 12384680
SPECIMEN QUALITY:: ADEQUATE

## 2021-02-24 ENCOUNTER — Encounter: Payer: Self-pay | Admitting: Family Medicine

## 2021-02-24 NOTE — Addendum Note (Signed)
Addended byDamita Dunnings D on: 02/24/2021 12:06 PM   Modules accepted: Orders

## 2021-02-25 ENCOUNTER — Other Ambulatory Visit: Payer: Self-pay | Admitting: Family Medicine

## 2021-02-25 DIAGNOSIS — I10 Essential (primary) hypertension: Secondary | ICD-10-CM

## 2021-02-25 DIAGNOSIS — N289 Disorder of kidney and ureter, unspecified: Secondary | ICD-10-CM

## 2021-02-26 ENCOUNTER — Other Ambulatory Visit (HOSPITAL_BASED_OUTPATIENT_CLINIC_OR_DEPARTMENT_OTHER): Payer: Self-pay | Admitting: Family Medicine

## 2021-02-26 ENCOUNTER — Ambulatory Visit (HOSPITAL_BASED_OUTPATIENT_CLINIC_OR_DEPARTMENT_OTHER)
Admission: RE | Admit: 2021-02-26 | Discharge: 2021-02-26 | Disposition: A | Discharge status: Home / Self Care | Payer: Medicare HMO | Source: Ambulatory Visit | Attending: Family Medicine | Admitting: Family Medicine

## 2021-02-26 ENCOUNTER — Other Ambulatory Visit: Payer: Self-pay

## 2021-02-26 DIAGNOSIS — R1032 Left lower quadrant pain: Secondary | ICD-10-CM | POA: Insufficient documentation

## 2021-02-26 DIAGNOSIS — N2889 Other specified disorders of kidney and ureter: Secondary | ICD-10-CM | POA: Diagnosis not present

## 2021-02-26 DIAGNOSIS — Z1231 Encounter for screening mammogram for malignant neoplasm of breast: Secondary | ICD-10-CM

## 2021-02-26 DIAGNOSIS — I10 Essential (primary) hypertension: Secondary | ICD-10-CM | POA: Diagnosis not present

## 2021-02-26 DIAGNOSIS — N179 Acute kidney failure, unspecified: Secondary | ICD-10-CM | POA: Diagnosis not present

## 2021-02-26 DIAGNOSIS — N289 Disorder of kidney and ureter, unspecified: Secondary | ICD-10-CM | POA: Diagnosis not present

## 2021-02-28 ENCOUNTER — Other Ambulatory Visit: Payer: Self-pay | Admitting: Family Medicine

## 2021-02-28 DIAGNOSIS — J9801 Acute bronchospasm: Secondary | ICD-10-CM

## 2021-02-28 DIAGNOSIS — U071 COVID-19: Secondary | ICD-10-CM

## 2021-02-28 MED ORDER — HYDROCODONE BIT-HOMATROP MBR 5-1.5 MG/5ML PO SOLN: 5.0000 mL | Freq: Three times a day (TID) | ORAL | 0 refills | Status: DC | PRN

## 2021-02-28 NOTE — Telephone Encounter (Signed)
Refill request for Hycodan syrup. Last filled 02/13/21 #123mL and 0RF

## 2021-03-10 ENCOUNTER — Other Ambulatory Visit (INDEPENDENT_AMBULATORY_CARE_PROVIDER_SITE_OTHER): Payer: Medicare HMO

## 2021-03-10 ENCOUNTER — Encounter: Payer: Self-pay | Admitting: Family Medicine

## 2021-03-10 ENCOUNTER — Other Ambulatory Visit: Payer: Self-pay

## 2021-03-10 DIAGNOSIS — N289 Disorder of kidney and ureter, unspecified: Secondary | ICD-10-CM

## 2021-03-10 LAB — COMPREHENSIVE METABOLIC PANEL
ALT: 13 U/L (ref 0–35)
AST: 17 U/L (ref 0–37)
Albumin: 4 g/dL (ref 3.5–5.2)
Alkaline Phosphatase: 46 U/L (ref 39–117)
BUN: 24 mg/dL — ABNORMAL HIGH (ref 6–23)
CO2: 26 mEq/L (ref 19–32)
Calcium: 9.3 mg/dL (ref 8.4–10.5)
Chloride: 104 mEq/L (ref 96–112)
Creatinine, Ser: 1.46 mg/dL — ABNORMAL HIGH (ref 0.40–1.20)
GFR: 35.69 mL/min — ABNORMAL LOW (ref >60.00)
Glucose, Bld: 104 mg/dL — ABNORMAL HIGH (ref 70–99)
Potassium: 4.1 mEq/L (ref 3.5–5.1)
Sodium: 140 mEq/L (ref 135–145)
Total Bilirubin: 0.4 mg/dL (ref 0.2–1.2)
Total Protein: 6.8 g/dL (ref 6.0–8.3)

## 2021-03-13 DIAGNOSIS — L570 Actinic keratosis: Secondary | ICD-10-CM | POA: Diagnosis not present

## 2021-03-13 DIAGNOSIS — L821 Other seborrheic keratosis: Secondary | ICD-10-CM | POA: Diagnosis not present

## 2021-03-25 ENCOUNTER — Ambulatory Visit (HOSPITAL_BASED_OUTPATIENT_CLINIC_OR_DEPARTMENT_OTHER)
Admission: RE | Admit: 2021-03-25 | Discharge: 2021-03-25 | Disposition: A | Discharge status: Home / Self Care | Payer: Medicare HMO | Source: Ambulatory Visit | Attending: Family Medicine | Admitting: Family Medicine

## 2021-03-25 ENCOUNTER — Other Ambulatory Visit: Payer: Self-pay

## 2021-03-25 ENCOUNTER — Encounter (HOSPITAL_BASED_OUTPATIENT_CLINIC_OR_DEPARTMENT_OTHER): Payer: Self-pay

## 2021-03-25 DIAGNOSIS — Z1231 Encounter for screening mammogram for malignant neoplasm of breast: Secondary | ICD-10-CM | POA: Diagnosis not present

## 2021-04-01 ENCOUNTER — Other Ambulatory Visit: Payer: Self-pay

## 2021-04-01 ENCOUNTER — Encounter: Payer: Self-pay | Admitting: Family Medicine

## 2021-04-01 DIAGNOSIS — E785 Hyperlipidemia, unspecified: Secondary | ICD-10-CM

## 2021-04-01 NOTE — Telephone Encounter (Signed)
Pt scheduled  

## 2021-04-03 ENCOUNTER — Encounter: Payer: Self-pay | Admitting: Family Medicine

## 2021-04-03 ENCOUNTER — Other Ambulatory Visit: Payer: Self-pay

## 2021-04-03 ENCOUNTER — Other Ambulatory Visit (INDEPENDENT_AMBULATORY_CARE_PROVIDER_SITE_OTHER): Payer: Medicare HMO

## 2021-04-03 DIAGNOSIS — E785 Hyperlipidemia, unspecified: Secondary | ICD-10-CM | POA: Diagnosis not present

## 2021-04-03 LAB — COMPREHENSIVE METABOLIC PANEL
ALT: 11 U/L (ref 0–35)
AST: 16 U/L (ref 0–37)
Albumin: 4.3 g/dL (ref 3.5–5.2)
Alkaline Phosphatase: 40 U/L (ref 39–117)
BUN: 24 mg/dL — ABNORMAL HIGH (ref 6–23)
CO2: 27 mEq/L (ref 19–32)
Calcium: 9.6 mg/dL (ref 8.4–10.5)
Chloride: 106 mEq/L (ref 96–112)
Creatinine, Ser: 1.31 mg/dL — ABNORMAL HIGH (ref 0.40–1.20)
GFR: 40.62 mL/min — ABNORMAL LOW (ref >60.00)
Glucose, Bld: 99 mg/dL (ref 70–99)
Potassium: 4.3 mEq/L (ref 3.5–5.1)
Sodium: 139 mEq/L (ref 135–145)
Total Bilirubin: 0.5 mg/dL (ref 0.2–1.2)
Total Protein: 7.1 g/dL (ref 6.0–8.3)

## 2021-04-10 ENCOUNTER — Telehealth: Payer: Self-pay

## 2021-04-10 NOTE — Telephone Encounter (Signed)
Pt called in states that she needed labs before her appointment. I was not sure what labs to put in.

## 2021-04-11 ENCOUNTER — Other Ambulatory Visit: Payer: Self-pay

## 2021-04-11 DIAGNOSIS — I10 Essential (primary) hypertension: Secondary | ICD-10-CM

## 2021-04-11 DIAGNOSIS — E785 Hyperlipidemia, unspecified: Secondary | ICD-10-CM

## 2021-04-11 NOTE — Telephone Encounter (Signed)
Pt scheduled and labs placed

## 2021-04-15 DIAGNOSIS — Z0100 Encounter for examination of eyes and vision without abnormal findings: Secondary | ICD-10-CM | POA: Diagnosis not present

## 2021-04-17 ENCOUNTER — Encounter: Payer: Self-pay | Admitting: Family Medicine

## 2021-04-18 ENCOUNTER — Other Ambulatory Visit: Payer: Self-pay | Admitting: Family Medicine

## 2021-04-18 DIAGNOSIS — Z23 Encounter for immunization: Secondary | ICD-10-CM

## 2021-04-18 NOTE — Progress Notes (Unsigned)
sars

## 2021-04-21 ENCOUNTER — Other Ambulatory Visit: Payer: Self-pay | Admitting: Family Medicine

## 2021-04-24 ENCOUNTER — Other Ambulatory Visit (INDEPENDENT_AMBULATORY_CARE_PROVIDER_SITE_OTHER): Payer: Medicare HMO

## 2021-04-24 ENCOUNTER — Other Ambulatory Visit: Payer: Self-pay

## 2021-04-24 DIAGNOSIS — I10 Essential (primary) hypertension: Secondary | ICD-10-CM | POA: Diagnosis not present

## 2021-04-24 DIAGNOSIS — Z23 Encounter for immunization: Secondary | ICD-10-CM | POA: Diagnosis not present

## 2021-04-24 DIAGNOSIS — E785 Hyperlipidemia, unspecified: Secondary | ICD-10-CM

## 2021-04-24 LAB — COMPREHENSIVE METABOLIC PANEL
ALT: 11 U/L (ref 0–35)
AST: 16 U/L (ref 0–37)
Albumin: 4.3 g/dL (ref 3.5–5.2)
Alkaline Phosphatase: 42 U/L (ref 39–117)
BUN: 25 mg/dL — ABNORMAL HIGH (ref 6–23)
CO2: 31 mEq/L (ref 19–32)
Calcium: 9.5 mg/dL (ref 8.4–10.5)
Chloride: 101 mEq/L (ref 96–112)
Creatinine, Ser: 1.4 mg/dL — ABNORMAL HIGH (ref 0.40–1.20)
GFR: 37.5 mL/min — ABNORMAL LOW (ref >60.00)
Glucose, Bld: 91 mg/dL (ref 70–99)
Potassium: 4.6 mEq/L (ref 3.5–5.1)
Sodium: 138 mEq/L (ref 135–145)
Total Bilirubin: 0.4 mg/dL (ref 0.2–1.2)
Total Protein: 6.8 g/dL (ref 6.0–8.3)

## 2021-04-24 LAB — CBC WITH DIFFERENTIAL/PLATELET
Basophils Absolute: 0 10*3/uL (ref 0.0–0.1)
Basophils Relative: 0.5 % (ref 0.0–3.0)
Eosinophils Absolute: 0.1 10*3/uL (ref 0.0–0.7)
Eosinophils Relative: 2.2 % (ref 0.0–5.0)
HCT: 35.2 % — ABNORMAL LOW (ref 36.0–46.0)
Hemoglobin: 11.6 g/dL — ABNORMAL LOW (ref 12.0–15.0)
Lymphocytes Relative: 52 % — ABNORMAL HIGH (ref 12.0–46.0)
Lymphs Abs: 2.7 10*3/uL (ref 0.7–4.0)
MCHC: 32.9 g/dL (ref 30.0–36.0)
MCV: 92.3 fl (ref 78.0–100.0)
Monocytes Absolute: 0.4 10*3/uL (ref 0.1–1.0)
Monocytes Relative: 7.3 % (ref 3.0–12.0)
Neutro Abs: 2 10*3/uL (ref 1.4–7.7)
Neutrophils Relative %: 38 % — ABNORMAL LOW (ref 43.0–77.0)
Platelets: 236 10*3/uL (ref 150.0–400.0)
RBC: 3.81 Mil/uL — ABNORMAL LOW (ref 3.87–5.11)
RDW: 13.9 % (ref 11.5–15.5)
WBC: 5.3 10*3/uL (ref 4.0–10.5)

## 2021-04-24 LAB — LIPID PANEL
Cholesterol: 147 mg/dL (ref 0–200)
HDL: 46.6 mg/dL (ref >39.00)
LDL Cholesterol: 66 mg/dL (ref 0–99)
NonHDL: 100.53
Total CHOL/HDL Ratio: 3
Triglycerides: 172 mg/dL — ABNORMAL HIGH (ref 0.0–149.0)
VLDL: 34.4 mg/dL (ref 0.0–40.0)

## 2021-04-24 LAB — HEMOGLOBIN A1C: Hgb A1c MFr Bld: 5.8 % (ref 4.6–6.5)

## 2021-04-24 LAB — TSH: TSH: 3.35 u[IU]/mL (ref 0.35–5.50)

## 2021-04-25 LAB — SARS-COV-2 ANTIBODY(IGG)SPIKE,SEMI-QUANTITATIVE: SARS COV1 AB(IGG)SPIKE,SEMI QN: >150 index — ABNORMAL HIGH (ref <1.00)

## 2021-04-28 ENCOUNTER — Telehealth (INDEPENDENT_AMBULATORY_CARE_PROVIDER_SITE_OTHER): Payer: Medicare HMO | Admitting: Family Medicine

## 2021-04-28 ENCOUNTER — Encounter: Payer: Self-pay | Admitting: Family Medicine

## 2021-04-28 ENCOUNTER — Other Ambulatory Visit: Payer: Self-pay

## 2021-04-28 ENCOUNTER — Ambulatory Visit (INDEPENDENT_AMBULATORY_CARE_PROVIDER_SITE_OTHER): Payer: Medicare HMO

## 2021-04-28 VITALS — Ht 61.0 in | Wt 181.0 lb

## 2021-04-28 DIAGNOSIS — E739 Lactose intolerance, unspecified: Secondary | ICD-10-CM | POA: Diagnosis not present

## 2021-04-28 DIAGNOSIS — E785 Hyperlipidemia, unspecified: Secondary | ICD-10-CM | POA: Diagnosis not present

## 2021-04-28 DIAGNOSIS — N289 Disorder of kidney and ureter, unspecified: Secondary | ICD-10-CM | POA: Insufficient documentation

## 2021-04-28 DIAGNOSIS — I10 Essential (primary) hypertension: Secondary | ICD-10-CM

## 2021-04-28 DIAGNOSIS — M791 Myalgia, unspecified site: Secondary | ICD-10-CM

## 2021-04-28 DIAGNOSIS — Z780 Asymptomatic menopausal state: Secondary | ICD-10-CM

## 2021-04-28 DIAGNOSIS — U071 COVID-19: Secondary | ICD-10-CM | POA: Diagnosis not present

## 2021-04-28 DIAGNOSIS — Z0000 Encounter for general adult medical examination without abnormal findings: Secondary | ICD-10-CM | POA: Diagnosis not present

## 2021-04-28 HISTORY — DX: Disorder of kidney and ureter, unspecified: N28.9

## 2021-04-28 NOTE — Assessment & Plan Note (Signed)
Mild, repeat cmp after break from Voltaren in 2 weeks

## 2021-04-28 NOTE — Assessment & Plan Note (Signed)
hgba1c acceptable, minimize simple carbs. Increase exercise as tolerated.  

## 2021-04-28 NOTE — Progress Notes (Signed)
MyChart Video Visit    Virtual Visit via Video Note   This visit type was conducted due to national recommendations for restrictions regarding the COVID-19 Pandemic (e.g. social distancing) in an effort to limit this patient's exposure and mitigate transmission in our community. This patient is at least at moderate risk for complications without adequate follow up. This format is felt to be most appropriate for this patient at this time. Physical exam was limited by quality of the video and audio technology used for the visit. [name] was able to get the patient set up on a video visit.  Patient location: Home  Patient and provider in visit Provider location: Office  I discussed the limitations of evaluation and management by telemedicine and the availability of in person appointments. The patient expressed understanding and agreed to proceed.  Visit Date: 04/28/2021  Today's healthcare provider: Penni Homans, MD     Subjective:    Patient ID: [name], female    DOB: 06-15-1950, 70 y.o.   MRN: [id number]  Chief Complaint  Patient presents with   2 follow up    HPI Patient is in today for a video visit.  She reports she has been having myalgias and she has not been able to take any medications. Her upper arms feel very heavy and her right leg is very sore. She is worried about her kidney functions so she does not use ibuprofen. She has also been using Voltaren gel.   Her last lab work shoed a low level of iron.  She will also be giving blood tomorrow and wanted to see if it was okay.  Past Medical History:  Diagnosis Date   Arthritis    Benign paroxysmal positional vertigo 11/14/2013   Colon polyp    Depression    Diverticulosis    Essential hypertension, benign    GERD (gastroesophageal reflux disease) 09/02/2013   GI bleed    Glaucoma    Glucose intolerance (impaired glucose tolerance)    H/O tobacco use, presenting hazards to health 09/02/2013   History   of basal cell carcinoma    History of cardiac arrhythmia    Hyperlipidemia    Hypertension    Medicare welcome visit 11/28/2012   Obesity 02/08/2017   Palpitations 02/04/2017   Personal history of colonic polyps 09/02/2013   Patient reports H/O 2 colonoscopies, last one dates back to 2012 in [place] She reports they were labeled as incomplete.  Reports 1st colonoscopy had 1 benign polyp and the 2nd was clear.    Renal insufficiency 04/28/2021   Sleep apnea 10/01/2016   Not on CPAP machine    Stroke [facility])    UTI (urinary tract infection)     Past Surgical History:  Procedure Laterality Date   CAROTID STENT     CERVICAL CONE BIOPSY  1960s   COLONOSCOPY  2016   DILATION AND CURETTAGE OF UTERUS  1966   KNEE SURGERY Left 11/01/2008   left knee total replacement Dr Lajean Saver TOOTH EXTRACTION      Family History  Problem Relation Age of Onset   Coronary artery disease Paternal Grandmother    Hyperlipidemia Paternal Grandmother    Hypertension Paternal Grandmother    Stroke Paternal Grandmother    Hypertension Mother    Lung cancer Mother        smoker   Uterine cancer Mother    Birth defects Mother        lung cancer in  smoker   COPD Mother    Hypertension Father    Kidney disease Father        Renal Cell Carcinoma   Birth defects Father        renal cell CA, smoker   Hypertension Maternal Grandmother    Hypertension Maternal Grandfather    Hypertension Paternal Grandfather    Dementia Paternal Grandfather    Cancer Brother        testicular   Heart disease Brother        NI, smoker   Hyperlipidemia Other    Colon cancer Neg Hx    Esophageal cancer Neg Hx     Social History   Socioeconomic History   Marital status: Divorced    Spouse name: Not on file   Number of children: 0   Years of education: Not on file   Highest education level: Not on file  Occupational History   Occupation: Retired   Tobacco Use   Smoking status: Former    Packs/day: 1.00    Years:  15.00    Pack years: 15.00    Types: Cigarettes    Quit date: 06/08/1982    Years since quitting: 38.9   Smokeless tobacco: Never  Vaping Use   Vaping Use: Never used  Substance and Sexual Activity   Alcohol use: Yes    Comment: 1-2 glasses of wine per day   Drug use: No   Sexual activity: Not Currently    Birth control/protection: Post-menopausal    Comment: lives alone, no dietary restrictions except no lactose,  Other Topics Concern   Not on file  Social History Narrative   Occupation: unemployed Product manager- now retired   Divorced   no children    Moved from [place] alone with 4 cats   Enjoys gardening, planting.             Social Determinants of Health   Financial Resource Strain: Low Risk    Difficulty of Paying Living Expenses: Not hard at all  Food Insecurity: No Food Insecurity   Worried About Charity fundraiser in the Last Year: Never true   [place] in the Last Year: Never true  Transportation Needs: No Transportation Needs   Lack of Transportation (Medical): No   Lack of Transportation (Non-Medical): No  Physical Activity: Inactive   Days of Exercise per Week: 0 days   Minutes of Exercise per Session: 0 min  Stress: No Stress Concern Present   Feeling of Stress : Not at all  Social Connections: Socially Isolated   Frequency of Communication with Friends and Family: More than three times a week   Frequency of Social Gatherings with Friends and Family: More than three times a week   Attends Religious Services: Never   Marine scientist or Organizations: No   Attends Music therapist: Never   Marital Status: Divorced  Human resources officer Violence: Not At Risk   Fear of Current or Ex-Partner: No   Emotionally Abused: No   Physically Abused: No   Sexually Abused: No    Outpatient Medications Prior to Visit  Medication Sig Dispense Refill   Acetylcysteine (N-ACETYL-L-CYSTEINE) 600 MG CAPS      aspirin 81 MG  tablet Take 81 mg by mouth daily.     diclofenac Sodium (VOLTAREN) 1 % GEL Apply topically 4 (four) times daily.     fish oil-omega-3 fatty acids 1000 MG capsule Take 1 g  by mouth daily.      hydrochlorothiazide (MICROZIDE) 12.5 MG capsule Take 1 capsule by mouth once daily 90 capsule 0   hyoscyamine (ANASPAZ) 0.125 MG TBDP disintergrating tablet Place 1 tablet (0.125 mg total) under the tongue every 4 (four) hours as needed. 30 tablet 1   lovastatin (MEVACOR) 40 MG tablet Take 1 tablet (40 mg total) by mouth at bedtime. 90 tablet 3   metoprolol tartrate (LOPRESSOR) 50 MG tablet Take 1 tablet by mouth twice daily 180 tablet 0   Multiple Vitamins-Minerals (MULTIVITAMIN WOMEN PO) Take 1 tablet by mouth daily.     nitrofurantoin, macrocrystal-monohydrate, (MACROBID) 100 MG capsule Take 1 capsule (100 mg total) by mouth 2 (two) times daily. 10 capsule 0   Oxymetazoline HCl (NASAL SPRAY) 0.05 % SOLN Place into the nose.     tretinoin (RETIN-A) 0.1 % cream APPLY TO AFFECTED AREA(S) ONE TIME DAILY AT BEDTIME USING PEA SIZED AMOUNT TO FACE 45 g 5   albuterol (VENTOLIN HFA) 108 (90 Base) MCG/ACT inhaler Inhale 2 puffs into the lungs every 6 (six) hours as needed for wheezing or shortness of breath. 8 g 0   HYDROcodone bit-homatropine (HYCODAN) 5-1.5 MG/5ML syrup Take 5 mLs by mouth every 8 (eight) hours as needed for cough. (Patient not taking: Reported on 04/28/2021) 120 mL 0   ibuprofen (ADVIL,MOTRIN) 200 MG tablet Take 400 mg by mouth every 6 (six) hours as needed for headache or moderate pain.  (Patient not taking: Reported on 04/28/2021)     nirmatrelvir/ritonavir EUA (PAXLOVID) 20 x 150 MG & 10 x 100MG  TABS (Take nirmatrelvir 150 mg one tablets twice daily for 5 days and ritonavir 100 mg one tablet twice daily for 5 days) Patient GFR is 50.45ml/min 20 tablet 0   Probiotic Product (PROBIOTIC DAILY PO) Take by mouth.     No facility-administered medications prior to visit.    No Known  Allergies  Review of Systems  Constitutional:  Negative for chills.  HENT:  Negative for hearing loss.   Respiratory:  Negative for cough.   Musculoskeletal:  Positive for myalgias.      Objective:    Physical Exam Constitutional:      Appearance: Normal appearance. She is not toxic-appearing.  Neurological:     Mental Status: She is alert.  Psychiatric:        Mood and Affect: Mood normal.        Behavior: Behavior normal.        Thought Content: Thought content normal.    BP (!) 156/66   Temp (!) 97.1 F (36.2 C)  Wt Readings from Last 3 Encounters:  04/28/21 181 lb (82.1 kg)  01/30/21 181 lb (82.1 kg)  09/19/20 191 lb 12.8 oz (87 kg)    Diabetic Foot Exam - Simple   No data filed    Lab Results  Component Value Date   WBC 5.3 04/24/2021   HGB 11.6 (L) 04/24/2021   HCT 35.2 (L) 04/24/2021   PLT 236.0 04/24/2021   GLUCOSE 91 04/24/2021   CHOL 147 04/24/2021   TRIG 172.0 (H) 04/24/2021   HDL 46.60 04/24/2021   LDLDIRECT 131.8 10/10/2008   LDLCALC 66 04/24/2021   ALT 11 04/24/2021   AST 16 04/24/2021   NA 138 04/24/2021   K 4.6 04/24/2021   CL 101 04/24/2021   CREATININE 1.40 (H) 04/24/2021   BUN 25 (H) 04/24/2021   CO2 31 04/24/2021   TSH 3.35 04/24/2021   INR 1.1 10/06/2019  HGBA1C 5.8 04/24/2021    Lab Results  Component Value Date   TSH 3.35 04/24/2021   Lab Results  Component Value Date   WBC 5.3 04/24/2021   HGB 11.6 (L) 04/24/2021   HCT 35.2 (L) 04/24/2021   MCV 92.3 04/24/2021   PLT 236.0 04/24/2021   Lab Results  Component Value Date   NA 138 04/24/2021   K 4.6 04/24/2021   CO2 31 04/24/2021   GLUCOSE 91 04/24/2021   BUN 25 (H) 04/24/2021   CREATININE 1.40 (H) 04/24/2021   BILITOT 0.4 04/24/2021   ALKPHOS 42 04/24/2021   AST 16 04/24/2021   ALT 11 04/24/2021   PROT 6.8 04/24/2021   ALBUMIN 4.3 04/24/2021   CALCIUM 9.5 04/24/2021   ANIONGAP 11 10/06/2019   GFR 37.50 (L) 04/24/2021   Lab Results  Component Value Date    CHOL 147 04/24/2021   Lab Results  Component Value Date   HDL 46.60 04/24/2021   Lab Results  Component Value Date   LDLCALC 66 04/24/2021   Lab Results  Component Value Date   TRIG 172.0 (H) 04/24/2021   Lab Results  Component Value Date   CHOLHDL 3 04/24/2021   Lab Results  Component Value Date   HGBA1C 5.8 04/24/2021       Assessment & Plan:   Problem List Items Addressed This Visit     GLUCOSE INTOLERANCE    hgba1c acceptable, minimize simple carbs. Increase exercise as tolerated.       Hyperlipidemia    Encourage heart healthy diet such as MIND or DASH diet, increase exercise, avoid trans fats, simple carbohydrates and processed foods, consider a krill or fish or flaxseed oil cap daily. Tolerating Lovastatin but she is having myalgias in her arms and right leg and she believes it started with COVID but will try a 2-4 week break and see if her symptoms improve. If so will try a lower dose alternative stain. If not will restart Lovastatin.      Essential hypertension, benign    Monitor and report any concerns, no changes to meds. Encouraged heart healthy diet such as the DASH diet and exercise as tolerated.       COVID-19    She notes persistent myalgais. Agrees to get the new Bivalent shot before the holidays      Myalgia    Hydrate well, take break from Lovastatin and stay active. Can try topicals has been using Voltaren gel and creatinine is up slightly take a two week break and try Lidocaine gel instead then repeat cmp and can restart if no change.      Renal insufficiency    Mild, repeat cmp after break from Voltaren in 2 weeks       @ENCMEDP @  No orders of the defined types were placed in this encounter.   I discussed the assessment and treatment plan with the patient. The patient was provided an opportunity to ask questions and all were answered. The patient agreed with the plan and demonstrated an understanding of the instructions.   The patient  was advised to call back or seek an in-person evaluation if the symptoms worsen or if the condition fails to improve as anticipated.  I provided 20 minutes of face-to-face time during this encounter.   I,Zite Okoli,acting as a Education administrator for Penni Homans, MD.,have documented all relevant documentation on the behalf of Penni Homans, MD,as directed by  Penni Homans, MD while in the presence of Penni Homans, MD.  I, Mosie Lukes, MD personally performed the services described in this documentation. All medical record entries made by the scribe were at my direction and in my presence. I have reviewed the chart and agree that the record reflects my personal performance and is accurate and complete     Penni Homans, MD [facility] at [place] (phone) [phone] (fax)  [place]

## 2021-04-28 NOTE — Patient Instructions (Signed)
Ms. [name] , Thank you for taking time to complete  your Medicare Wellness Visit. I appreciate your ongoing commitment to your health goals. Please review the following plan we discussed and let me know if I can assist you in the future.   Screening recommendations/referrals: Colonoscopy: Completed 12/27/2019-Due-12/27/2022 Mammogram: Completed 03/25/2021-Due 03/25/2022 Bone Density: Ordered today. Someone will call you to schedule. Recommended yearly ophthalmology/optometry visit for glaucoma screening and checkup Recommended yearly dental visit for hygiene and checkup  Vaccinations: Influenza vaccine: Up to date Pneumococcal vaccine: Up to date Tdap vaccine: Up to date Shingles vaccine: Discuss with pharmacy   Covid-19:Booster available at the pharmacy  Advanced directives: Copy in chart  Conditions/risks identified: See problem list  Next appointment: Follow up in one year for your annual wellness visit    Preventive Care 72 Years and Older, Female Preventive care refers to lifestyle choices and visits with your health care provider that can promote health and wellness. What does preventive care include? A yearly physical exam. This is also called an annual well check. Dental exams once or twice a year. Routine eye exams. Ask your health care provider how often you should have your eyes checked. Personal lifestyle choices, including: Daily care of your teeth and gums. Regular physical activity. Eating a healthy diet. Avoiding tobacco and drug use. Limiting alcohol use. Practicing safe sex. Taking low-dose aspirin every day. Taking vitamin and mineral supplements as recommended by your health care provider. What happens during an annual well check? The services and screenings done by your health care provider during your annual well check will depend on your age, overall health, lifestyle risk factors, and family history of disease. Counseling  Your health care provider may  ask you questions about your: Alcohol use. Tobacco use. Drug use. Emotional well-being. Home and relationship well-being. Sexual activity. Eating habits. History of falls. Memory and ability to understand (cognition). Work and work Statistician. Reproductive health. Screening  You may have the following tests or measurements: Height, weight, and BMI. Blood pressure. Lipid and cholesterol levels. These may be checked every 5 years, or more frequently if you are over 72 years old. Skin check. Lung cancer screening. You may have this screening every year starting at age 72 if you have a 30-pack-year history of smoking and currently smoke or have quit within the past 15 years. Fecal occult blood test (FOBT) of the stool. You may have this test every year starting at age 74. Flexible sigmoidoscopy or colonoscopy. You may have a sigmoidoscopy every 5 years or a colonoscopy every 10 years starting at age 72. Hepatitis C blood test. Hepatitis B blood test. Sexually transmitted disease (STD) testing. Diabetes screening. This is done by checking your blood sugar (glucose) after you have not eaten for a while (fasting). You may have this done every 1-3 years. Bone density scan. This is done to screen for osteoporosis. You may have this done starting at age 72. Mammogram. This may be done every 1-2 years. Talk to your health care provider about how often you should have regular mammograms. Talk with your health care provider about your test results, treatment options, and if necessary, the need for more tests. Vaccines  Your health care provider may recommend certain vaccines, such as: Influenza vaccine. This is recommended every year. Tetanus, diphtheria, and acellular pertussis (Tdap, Td) vaccine. You may need a Td booster every 10 years. Zoster vaccine. You may need this after age 72. Pneumococcal 13-valent conjugate (PCV13) vaccine. One dose is recommended  after age 72. Pneumococcal  polysaccharide (PPSV23) vaccine. One dose is recommended after age 72. Talk to your health care provider about which screenings and vaccines you need and how often you need them. This information is not intended to replace advice given to you by your health care provider. Make sure you discuss any questions you have with your health care provider. Document Released: 06/21/2015 Document Revised: 02/12/2016 Document Reviewed: 03/26/2015 Elsevier Interactive Patient Education  2017 [place] Prevention in the Home Falls can cause injuries. They can happen to people of all ages. There are many things you can do to make your home safe and to help prevent falls. What can I do on the outside of my home? Regularly fix the edges of walkways and driveways and fix any cracks. Remove anything that might make you trip as you walk through a door, such as a raised step or threshold. Trim any bushes or trees on the path to your home. Use bright outdoor lighting. Clear any walking paths of anything that might make someone trip, such as rocks or tools. Regularly check to see if handrails are loose or broken. Make sure that both sides of any steps have handrails. Any raised decks and porches should have guardrails on the edges. Have any leaves, snow, or ice cleared regularly. Use sand or salt on walking paths during winter. Clean up any spills in your garage right away. This includes oil or grease spills. What can I do in the bathroom? Use night lights. Install grab bars by the toilet and in the tub and shower. Do not use towel bars as grab bars. Use non-skid mats or decals in the tub or shower. If you need to sit down in the shower, use a plastic, non-slip stool. Keep the floor dry. Clean up any water that spills on the floor as soon as it happens. Remove soap buildup in the tub or shower regularly. Attach bath mats securely with double-sided non-slip rug tape. Do not have throw rugs and other  things on the floor that can make you trip. What can I do in the bedroom? Use night lights. Make sure that you have a light by your bed that is easy to reach. Do not use any sheets or blankets that are too big for your bed. They should not hang down onto the floor. Have a firm chair that has side arms. You can use this for support while you get dressed. Do not have throw rugs and other things on the floor that can make you trip. What can I do in the kitchen? Clean up any spills right away. Avoid walking on wet floors. Keep items that you use a lot in easy-to-reach places. If you need to reach something above you, use a strong step stool that has a grab bar. Keep electrical cords out of the way. Do not use floor polish or wax that makes floors slippery. If you must use wax, use non-skid floor wax. Do not have throw rugs and other things on the floor that can make you trip. What can I do with my stairs? Do not leave any items on the stairs. Make sure that there are handrails on both sides of the stairs and use them. Fix handrails that are broken or loose. Make sure that handrails are as long as the stairways. Check any carpeting to make sure that it is firmly attached to the stairs. Fix any carpet that is loose or worn. Avoid having throw  rugs at the top or bottom of the stairs. If you do have throw rugs, attach them to the floor with carpet tape. Make sure that you have a light switch at the top of the stairs and the bottom of the stairs. If you do not have them, ask someone to add them for you. What else can I do to help prevent falls? Wear shoes that: Do not have high heels. Have rubber bottoms. Are comfortable and fit you well. Are closed at the toe. Do not wear sandals. If you use a stepladder: Make sure that it is fully opened. Do not climb a closed stepladder. Make sure that both sides of the stepladder are locked into place. Ask someone to hold it for you, if possible. Clearly  mark and make sure that you can see: Any grab bars or handrails. First and last steps. Where the edge of each step is. Use tools that help you move around (mobility aids) if they are needed. These include: Canes. Walkers. Scooters. Crutches. Turn on the lights when you go into a dark area. Replace any light bulbs as soon as they burn out. Set up your furniture so you have a clear path. Avoid moving your furniture around. If any of your floors are uneven, fix them. If there are any pets around you, be aware of where they are. Review your medicines with your doctor. Some medicines can make you feel dizzy. This can increase your chance of falling. Ask your doctor what other things that you can do to help prevent falls. This information is not intended to replace advice given to you by your health care provider. Make sure you discuss any questions you have with your health care provider. Document Released: 03/21/2009 Document Revised: 10/31/2015 Document Reviewed: 06/29/2014 Elsevier Interactive Patient Education  2017 [employer].

## 2021-04-28 NOTE — Assessment & Plan Note (Signed)
She notes persistent myalgais. Agrees to get the new Bivalent shot before the holidays

## 2021-04-28 NOTE — Progress Notes (Signed)
Subjective:   [name] is a 70 y.o. female who presents for Medicare Annual (Subsequent) preventive examination.  I connected with [name] today by a video enabled telemedicine application and verified that I am speaking with the correct person using two identifiers.  Location of patient:Home Location of provider:Work  Persons participating in the virtual visit: patient, nurse.   I discussed the limitations, risk, security and privacy concerns of evaluation and management by telemedicine. The patient expressed understanding and agreed to proceed.  Some vital signs may be absent or patient reported.   Review of Systems     Cardiac Risk Factors include: advanced age (>44men, >54 women);dyslipidemia;hypertension;obesity (BMI >30kg/m2);sedentary lifestyle     Objective:    Today's Vitals   04/28/21 1018  Weight: 181 lb (82.1 kg)  Height: 5\' 1"  (1.549 m)  PainSc: 4    Body mass index is 34.2 kg/m.  Advanced Directives 04/28/2021 10/06/2019 02/23/2019 02/21/2018 08/18/2017 08/18/2017 02/04/2016  Does Patient Have a Medical Advance Directive? Yes Yes Yes Yes Yes No Yes  Type of Paramedic of [place];Living will - Living will [place];Living will Living will;Healthcare Power of [place];Living will  Does patient want to make changes to medical advance directive? - No - Patient declined No - Patient declined - No - Patient declined - -  Copy of [place] in Chart? Yes - validated most recent copy scanned in chart (See row information) - - No - copy requested No - copy requested - No - copy requested  Would patient like information on creating a medical advance directive? - - - - No - Patient declined No - Patient declined -    Current Medications (verified) Outpatient Encounter Medications as of 04/28/2021  Medication Sig   Acetylcysteine (N-ACETYL-L-CYSTEINE) 600 MG CAPS    albuterol  (VENTOLIN HFA) 108 (90 Base) MCG/ACT inhaler Inhale 2 puffs into the lungs every 6 (six) hours as needed for wheezing or shortness of breath.   aspirin 81 MG tablet Take 81 mg by mouth daily.   fish oil-omega-3 fatty acids 1000 MG capsule Take 1 g by mouth daily.    hydrochlorothiazide (MICROZIDE) 12.5 MG capsule Take 1 capsule by mouth once daily   hyoscyamine (ANASPAZ) 0.125 MG TBDP disintergrating tablet Place 1 tablet (0.125 mg total) under the tongue every 4 (four) hours as needed.   lovastatin (MEVACOR) 40 MG tablet Take 1 tablet (40 mg total) by mouth at bedtime.   metoprolol tartrate (LOPRESSOR) 50 MG tablet Take 1 tablet by mouth twice daily   Multiple Vitamins-Minerals (MULTIVITAMIN WOMEN PO) Take 1 tablet by mouth daily.   nitrofurantoin, macrocrystal-monohydrate, (MACROBID) 100 MG capsule Take 1 capsule (100 mg total) by mouth 2 (two) times daily.   Oxymetazoline HCl (NASAL SPRAY) 0.05 % SOLN Place into the nose.   tretinoin (RETIN-A) 0.1 % cream APPLY TO AFFECTED AREA(S) ONE TIME DAILY AT BEDTIME USING PEA SIZED AMOUNT TO FACE   HYDROcodone bit-homatropine (HYCODAN) 5-1.5 MG/5ML syrup Take 5 mLs by mouth every 8 (eight) hours as needed for cough. (Patient not taking: Reported on 04/28/2021)   ibuprofen (ADVIL,MOTRIN) 200 MG tablet Take 400 mg by mouth every 6 (six) hours as needed for headache or moderate pain.  (Patient not taking: Reported on 04/28/2021)   nirmatrelvir/ritonavir EUA (PAXLOVID) 20 x 150 MG & 10 x 100MG  TABS (Take nirmatrelvir 150 mg one tablets twice daily for 5 days and ritonavir 100 mg  one tablet twice daily for 5 days) Patient GFR is 50.34ml/min   Probiotic Product (PROBIOTIC DAILY PO) Take by mouth.   No facility-administered encounter medications on file as of 04/28/2021.    Allergies (verified) Patient has no known allergies.   History: Past Medical History:  Diagnosis Date   Arthritis    Benign paroxysmal positional vertigo 11/14/2013   Colon polyp     Depression    Diverticulosis    Essential hypertension, benign    GERD (gastroesophageal reflux disease) 09/02/2013   GI bleed    Glaucoma    Glucose intolerance (impaired glucose tolerance)    H/O tobacco use, presenting hazards to health 09/02/2013   History  of basal cell carcinoma    History of cardiac arrhythmia    Hyperlipidemia    Hypertension    Medicare welcome visit 11/28/2012   Obesity 02/08/2017   Palpitations 02/04/2017   Personal history of colonic polyps 09/02/2013   Patient reports H/O 2 colonoscopies, last one dates back to 2012 in [place] She reports they were labeled as incomplete.  Reports 1st colonoscopy had 1 benign polyp and the 2nd was clear.    Sleep apnea 10/01/2016   Not on CPAP machine    Stroke [facility])    UTI (urinary tract infection)    Past Surgical History:  Procedure Laterality Date   CAROTID STENT     CERVICAL CONE BIOPSY  1960s   COLONOSCOPY  2016   DILATION AND CURETTAGE OF UTERUS  1966   KNEE SURGERY Left 11/01/2008   left knee total replacement Dr Sharol Given   WISDOM TOOTH EXTRACTION     Family History  Problem Relation Age of Onset   Coronary artery disease Paternal Grandmother    Hyperlipidemia Paternal Grandmother    Hypertension Paternal Grandmother    Stroke Paternal Grandmother    Hypertension Mother    Lung cancer Mother        smoker   Uterine cancer Mother    Birth defects Mother        lung cancer in smoker   COPD Mother    Hypertension Father    Kidney disease Father        Renal Cell Carcinoma   Birth defects Father        renal cell CA, smoker   Hypertension Maternal Grandmother    Hypertension Maternal Grandfather    Hypertension Paternal Grandfather    Dementia Paternal Grandfather    Cancer Brother        testicular   Heart disease Brother        NI, smoker   Hyperlipidemia Other    Colon cancer Neg Hx    Esophageal cancer Neg Hx    Social History   Socioeconomic History   Marital status: Divorced    Spouse name: Not  on file   Number of children: 0   Years of education: Not on file   Highest education level: Not on file  Occupational History   Occupation: Retired   Tobacco Use   Smoking status: Former    Packs/day: 1.00    Years: 15.00    Pack years: 15.00    Types: Cigarettes    Quit date: 06/08/1982    Years since quitting: 38.9   Smokeless tobacco: Never  Vaping Use   Vaping Use: Never used  Substance and Sexual Activity   Alcohol use: Yes    Comment: 1-2 glasses of wine per day   Drug use: No  Sexual activity: Not Currently    Birth control/protection: Post-menopausal    Comment: lives alone, no dietary restrictions except no lactose,  Other Topics Concern   Not on file  Social History Narrative   Occupation: unemployed Product manager- now retired   Divorced   no children    Moved from [place] alone with 4 cats   Enjoys gardening, planting.             Social Determinants of Health   Financial Resource Strain: Low Risk    Difficulty of Paying Living Expenses: Not hard at all  Food Insecurity: No Food Insecurity   Worried About Charity fundraiser in the Last Year: Never true   [place] in the Last Year: Never true  Transportation Needs: No Transportation Needs   Lack of Transportation (Medical): No   Lack of Transportation (Non-Medical): No  Physical Activity: Inactive   Days of Exercise per Week: 0 days   Minutes of Exercise per Session: 0 min  Stress: No Stress Concern Present   Feeling of Stress : Not at all  Social Connections: Socially Isolated   Frequency of Communication with Friends and Family: More than three times a week   Frequency of Social Gatherings with Friends and Family: More than three times a week   Attends Religious Services: Never   Marine scientist or Organizations: No   Attends Music therapist: Never   Marital Status: Divorced    Tobacco Counseling Counseling given: Not Answered   Clinical  Intake:  Pre-visit preparation completed: Yes  Pain : 0-10 Pain Score: 4  Pain Type: Chronic pain Pain Location: Generalized (since having Covid) Pain Onset: More than a month ago Pain Frequency: Intermittent     BMI - recorded: 34.2 Nutritional Status: BMI > 30  Obese Nutritional Risks: None Diabetes: No  How often do you need to have someone help you when you read instructions, pamphlets, or other written materials from your doctor or pharmacy?: 1 - Never  Diabetic?No  Interpreter Needed?: No  Information entered by :: Caroleen Hamman LPN   Activities of Daily Living In your present state of health, do you have any difficulty performing the following activities: 04/28/2021 09/19/2020  Hearing? N N  Vision? N N  Difficulty concentrating or making decisions? N N  Walking or climbing stairs? N N  Dressing or bathing? N N  Doing errands, shopping? N N  Preparing Food and eating ? N -  Using the Toilet? N -  In the past six months, have you accidently leaked urine? N -  Do you have problems with loss of bowel control? N -  Managing your Medications? N -  Managing your Finances? N -  Housekeeping or managing your Housekeeping? N -  Some recent data might be hidden    Patient Care Team: Mosie Lukes, MD as PCP - General (Family Medicine) Rolm Bookbinder, MD as Consulting Physician (Dermatology) Emily Filbert, MD (Inactive) as Consulting Physician (Obstetrics and Gynecology) Hale Bogus., MD as Consulting Physician (Gastroenterology)  Indicate any recent Medical Services you may have received from other than [facility] providers in the past year (date may be approximate).     Assessment:   This is a routine wellness examination for [facility].  Hearing/Vision screen Hearing Screening - Comments:: No issues Vision Screening - Comments:: [facility] [place] issues and exercise activities discussed: Current Exercise  Habits: The  patient does not participate in regular exercise at present, Exercise limited by: Other - see comments (per patient-generalized pain from previous covid infection)   Goals Addressed             This Visit's Progress    Patient Stated       Wants to feel better from having Covid in August       Depression Screen PHQ 2/9 Scores 04/28/2021 09/19/2020 02/23/2019 02/21/2018 02/04/2017 02/04/2016 12/25/2014  PHQ - 2 Score 0 0 0 0 4 0 0  PHQ- 9 Score - 3 - - 9 - -    Fall Risk Fall Risk  04/28/2021 09/19/2020 02/23/2019 02/21/2018 02/04/2017  Falls in the past year? 0 1 0 Yes No  Number falls in past yr: 0 1 - 1 -  Comment - - - - -  Injury with Fall? 0 0 - - -  Follow up Falls prevention discussed - - Falls prevention discussed;Education provided -    FALL RISK PREVENTION PERTAINING TO THE HOME:  Any stairs in or around the home? No  Home free of loose throw rugs in walkways, pet beds, electrical cords, etc? Yes  Adequate lighting in your home to reduce risk of falls? Yes   ASSISTIVE DEVICES UTILIZED TO PREVENT FALLS:  Life alert? No  Use of a cane, walker or w/c? No  Grab bars in the bathroom? Yes  Shower chair or bench in shower? Yes  Elevated toilet seat or a handicapped toilet? No   TIMED UP AND GO:  Was the test performed? No . Phone visit   Cognitive Function:Normal cognitive status assessed by direct observation by this Nurse Health Advisor. No abnormalities found.   MMSE - Mini Mental State Exam 02/04/2016  Orientation to time 5  Orientation to Place 5  Registration 3  Attention/ Calculation 5  Recall 3  Language- name 2 objects 2  Language- repeat 1  Language- follow 3 step command 3  Language- read & follow direction 1  Write a sentence 1  Copy design 1  Total score 30        Immunizations Immunization History  Administered Date(s) Administered   Influenza, Quadrivalent, Recombinant, Inj, Pf 02/27/2019   Influenza-Unspecified 02/27/2019, 03/02/2020,  03/19/2021   PFIZER(Purple Top)SARS-COV-2 Vaccination 08/01/2019, 08/22/2019, 03/02/2020   PNEUMOCOCCAL CONJUGATE-20 12/25/2020   Pneumococcal Conjugate-13 08/28/2013   Pneumococcal Polysaccharide-23 10/16/2008, 12/25/2014   Tdap 11/28/2012   Zoster, Live 12/02/2012    TDAP status: Up to date  Flu Vaccine status: Up to date  Pneumococcal vaccine status: Up to date  Covid-19 vaccine status: Information provided on how to obtain vaccines.   Qualifies for Shingles Vaccine? Yes   Zostavax completed Yes   Shingrix Completed?: No.    Education has been provided regarding the importance of this vaccine. Patient has been advised to call insurance company to determine out of pocket expense if they have not yet received this vaccine. Advised may also receive vaccine at local pharmacy or Health Dept. Verbalized acceptance and understanding.  Screening Tests Health Maintenance  Topic Date Due   Zoster Vaccines- Shingrix (1 of 2) Never done   COVID-19 Vaccine (4 - Booster for Pfizer series) 04/27/2020   TETANUS/TDAP  11/29/2022   MAMMOGRAM  03/26/2023   COLONOSCOPY (Pts 45-67yrs Insurance coverage will need to be confirmed)  12/26/2024   Pneumonia Vaccine 14+ Years old  Completed   INFLUENZA VACCINE  Completed   DEXA SCAN  Completed   Hepatitis  C Screening  Completed   HPV VACCINES  Aged Out    Health Maintenance  Health Maintenance Due  Topic Date Due   Zoster Vaccines- Shingrix (1 of 2) Never done   COVID-19 Vaccine (4 - Booster for Pfizer series) 04/27/2020    Colorectal cancer screening: Type of screening: Colonoscopy. Completed 12/27/2019. Repeat every 3-5 years  Mammogram status: Completed bilateral 03/25/2021. Repeat every year  Bone Density status: Ordered today. Pt provided with contact info and advised to call to schedule appt.  Lung Cancer Screening: (Low Dose CT Chest recommended if Age 67-80 years, 30 pack-year currently smoking OR have quit w/in 15years.) does not  qualify.     Additional Screening:  Hepatitis C Screening: Completed 02/04/2016  Vision Screening: Recommended annual ophthalmology exams for early detection of glaucoma and other disorders of the eye. Is the patient up to date with their annual eye exam?  Yes  Who is the provider or what is the name of the office in which the patient attends annual eye exams? [facility]   Dental Screening: Recommended annual dental exams for proper oral hygiene  Community Resource Referral / Chronic Care Management: CRR required this visit?  No   CCM required this visit?  No      Plan:     I have personally reviewed and noted the following in the patient's chart:   Medical and social history Use of alcohol, tobacco or illicit drugs  Current medications and supplements including opioid prescriptions.  Functional ability and status Nutritional status Physical activity Advanced directives List of other physicians Hospitalizations, surgeries, and ER visits in previous 12 months Vitals Screenings to include cognitive, depression, and falls Referrals and appointments  In addition, I have reviewed and discussed with patient certain preventive protocols, quality metrics, and best practice recommendations. A written personalized care plan for preventive services as well as general preventive health recommendations were provided to patient.   Due to this being a virtual visit, the after visit summary with patients personalized plan was offered to patient via mail or my-chart. Patient would like to access on my-chart.   Marta Antu, LPN   09/32/[id number]  Nurse Health Advisor  Nurse Notes: None

## 2021-04-28 NOTE — Assessment & Plan Note (Signed)
Hydrate well, take break from Lovastatin and stay active. Can try topicals has been using Voltaren gel and creatinine is up slightly take a two week break and try Lidocaine gel instead then repeat cmp and can restart if no change.

## 2021-04-28 NOTE — Assessment & Plan Note (Signed)
Monitor and report any concerns, no changes to meds. Encouraged heart healthy diet such as the DASH diet and exercise as tolerated.  ?

## 2021-04-28 NOTE — Patient Instructions (Signed)
Muscle Pain, Adult ?Muscle pain, also called myalgia, is a condition in which a person has pain in one or more muscles in the body. Muscle pain may be mild, moderate, or severe. It may feel sharp, achy, or burning. In most cases, the pain lasts only a short time and goes away without treatment. ?Muscle pain can result from using muscles in a new or different way or after a period of inactivity. It is normal to feel some muscle pain after starting an exercise program. Muscles that have not been used often will be sore at first. ?What are the causes? ?This condition is caused by using muscles in a new or different way after a period of inactivity. Other causes may include: ?Overuse or muscle strain, especially if you are not in shape. This is the most common cause of muscle pain. ?Injury or bruising. ?Infectious diseases, including diseases caused by viruses, such as the flu (influenza). ?Fibromyalgia.This is a long-term, or chronic, condition that causes muscle tenderness, tiredness (fatigue), and headache. ?Autoimmune or rheumatologic diseases. These are conditions, such as lupus, in which the body's defense system (immunesystem) attacks areas in the body. ?Certain medicines, including ACE inhibitors and statins. ?What are the signs or symptoms? ?The main symptom of this condition is sore or painful muscles, including during activity and when stretching. You may also have slight swelling. ?How is this diagnosed? ?This condition is diagnosed with a physical exam. Your health care provider will ask questions about your pain and when it began. If you have not had muscle pain for very long, your health care provider may want to wait before doing much testing. If your muscle pain has lasted a long time, tests may be done right away. In some cases, this may include tests to rule out certain conditions or illnesses. ?How is this treated? ?Treatment for this condition depends on the cause. Home care is often enough to  relieve muscle pain. Your health care provider may also prescribe NSAIDs, such as ibuprofen. ?Follow these instructions at home: ?Medicines ?Take over-the-counter and prescription medicines only as told by your health care provider. ?Ask your health care provider if the medicine prescribed to you requires you to avoid driving or using machinery. ?Managing pain, swelling, and discomfort ?  ?If directed, put ice on the painful area. To do this: ?Put ice in a plastic bag. ?Place a towel between your skin and the bag. ?Leave the ice on for 20 minutes, 2-3 times a day. ?For the first 2 days of muscle soreness, or if there is swelling: ?Do not soak in hot baths. ?Do not use a hot tub, steam room, sauna, heating pad, or other heat source. ?After 48-72 hours, you may alternate between applying ice and applying heat as told by your health care provider. If directed, apply heat to the affected area as often as told by your health care provider. Use the heat source that your health care provider recommends, such as a moist heat pack or a heating pad. ?Place a towel between your skin and the heat source. ?Leave the heat on for 20-30 minutes. ?Remove the heat if your skin turns bright red. This is especially important if you are unable to feel pain, heat, or cold. You may have a greater risk of getting burned. ?If you have an injury, raise (elevate) the injured area above the level of your heart while you are sitting or lying down. ?Activity ? ?If overuse is causing your muscle pain: ?Slow down your activities   until the pain goes away. ?Do regular, gentle exercises if you are not usually active. ?Warm up before exercising. Stretch before and after exercising. This can help lower the risk of muscle pain. ?Do not continue working out if the pain is severe. Severe pain could mean that you have injured a muscle. ?Do not lift anything that is heavier than 5-10 lb (2.3-4.5 kg), or the limit that you are told, until your health care  provider says that it is safe. ?Return to your normal activities as told by your health care provider. Ask your health care provider what activities are safe for you. ?General instructions ?Do not use any products that contain nicotine or tobacco, such as cigarettes, e-cigarettes, and chewing tobacco. These can delay healing. If you need help quitting, ask your health care provider. ?Keep all follow-up visits as told by your health care provider. This is important. ?Contact a health care provider if you have: ?Muscle pain that gets worse and medicines do not help. ?Muscle pain that lasts longer than 3 days. ?A rash or fever along with muscle pain. ?Muscle pain after a tick bite. ?Muscle pain while working out, even though you are in good physical condition. ?Redness, soreness, or swelling along with muscle pain. ?Muscle pain after starting a new medicine or changing the dose of a medicine. ?Get help right away if you have: ?Trouble breathing. ?Trouble swallowing. ?Muscle pain along with a stiff neck, fever, and vomiting. ?Severe muscle weakness or you cannot move part of your body. ?These symptoms may represent a serious problem that is an emergency. Do not wait to see if the symptoms will go away. Get medical help right away. Call your local emergency services (911 in the [place] Do not drive yourself to the hospital. ?Summary ?Muscle pain usually lasts only a short time and goes away without treatment. ?This condition is caused by using muscles in a new or different way after a period of inactivity. ?If your muscle pain lasts longer than 3 days, tell your health care provider. ?This information is not intended to replace advice given to you by your health care provider. Make sure you discuss any questions you have with your health care provider. ?Document Revised: 12/13/2020 Document Reviewed: 12/13/2020 ?Elsevier Patient Education ? 2022 Elsevier Inc. ? ?

## 2021-04-28 NOTE — Assessment & Plan Note (Signed)
Encourage heart healthy diet such as MIND or DASH diet, increase exercise, avoid trans fats, simple carbohydrates and processed foods, consider a krill or fish or flaxseed oil cap daily. Tolerating Lovastatin but she is having myalgias in her arms and right leg and she believes it started with COVID but will try a 2-4 week break and see if her symptoms improve. If so will try a lower dose alternative stain. If not will restart Lovastatin.

## 2021-04-29 ENCOUNTER — Telehealth (HOSPITAL_BASED_OUTPATIENT_CLINIC_OR_DEPARTMENT_OTHER): Payer: Self-pay

## 2021-04-30 ENCOUNTER — Ambulatory Visit: Payer: Medicare HMO | Attending: Internal Medicine

## 2021-04-30 DIAGNOSIS — Z23 Encounter for immunization: Secondary | ICD-10-CM

## 2021-04-30 NOTE — Progress Notes (Signed)
   Covid-19 Vaccination Clinic  Name:  [name]    MRN: [id number] DOB: [birth year]  04/30/2021  [name] was observed post Covid-19 immunization for 15 minutes without incident. She was provided with Vaccine Information Sheet and instruction to access the V-Safe system.   [name] was instructed to call 911 with any severe reactions post vaccine: Difficulty breathing  Swelling of face and throat  A fast heartbeat  A bad rash all over body  Dizziness and weakness   Immunizations Administered     Name Date Dose VIS Date Route   Pfizer Covid-19 Vaccine Bivalent Booster 04/30/2021 11:34 AM 0.3 mL 02/05/2021 Intramuscular   Manufacturer: [place]   Lot: [id number]   [place]: [phone]

## 2021-05-10 ENCOUNTER — Other Ambulatory Visit (HOSPITAL_BASED_OUTPATIENT_CLINIC_OR_DEPARTMENT_OTHER): Payer: Self-pay

## 2021-05-10 MED ORDER — PFIZER COVID-19 VAC BIVALENT 30 MCG/0.3ML IM SUSP
INTRAMUSCULAR | 0 refills | Status: DC
  Filled 2021-05-10: qty 0.3, 1d supply, fill #0

## 2021-05-12 ENCOUNTER — Other Ambulatory Visit (HOSPITAL_BASED_OUTPATIENT_CLINIC_OR_DEPARTMENT_OTHER): Payer: Self-pay

## 2021-05-13 ENCOUNTER — Telehealth: Payer: Self-pay | Admitting: Family Medicine

## 2021-05-16 ENCOUNTER — Other Ambulatory Visit: Payer: Self-pay | Admitting: Family Medicine

## 2021-05-16 ENCOUNTER — Other Ambulatory Visit (INDEPENDENT_AMBULATORY_CARE_PROVIDER_SITE_OTHER): Payer: Medicare HMO

## 2021-05-16 DIAGNOSIS — I10 Essential (primary) hypertension: Secondary | ICD-10-CM

## 2021-05-16 LAB — COMPREHENSIVE METABOLIC PANEL
ALT: 9 U/L (ref 0–35)
AST: 15 U/L (ref 0–37)
Albumin: 4.1 g/dL (ref 3.5–5.2)
Alkaline Phosphatase: 35 U/L — ABNORMAL LOW (ref 39–117)
BUN: 30 mg/dL — ABNORMAL HIGH (ref 6–23)
CO2: 25 mEq/L (ref 19–32)
Calcium: 9.4 mg/dL (ref 8.4–10.5)
Chloride: 103 mEq/L (ref 96–112)
Creatinine, Ser: 1.59 mg/dL — ABNORMAL HIGH (ref 0.40–1.20)
GFR: 32.17 mL/min — ABNORMAL LOW (ref >60.00)
Glucose, Bld: 119 mg/dL — ABNORMAL HIGH (ref 70–99)
Potassium: 4.2 mEq/L (ref 3.5–5.1)
Sodium: 137 mEq/L (ref 135–145)
Total Bilirubin: 0.4 mg/dL (ref 0.2–1.2)
Total Protein: 6.5 g/dL (ref 6.0–8.3)

## 2021-05-18 ENCOUNTER — Other Ambulatory Visit: Payer: Self-pay | Admitting: Family Medicine

## 2021-05-18 ENCOUNTER — Encounter: Payer: Self-pay | Admitting: Family Medicine

## 2021-05-18 DIAGNOSIS — N289 Disorder of kidney and ureter, unspecified: Secondary | ICD-10-CM

## 2021-05-19 ENCOUNTER — Other Ambulatory Visit: Payer: Self-pay | Admitting: Family Medicine

## 2021-05-19 ENCOUNTER — Other Ambulatory Visit: Payer: Self-pay

## 2021-05-19 ENCOUNTER — Ambulatory Visit (HOSPITAL_BASED_OUTPATIENT_CLINIC_OR_DEPARTMENT_OTHER)
Admission: RE | Admit: 2021-05-19 | Discharge: 2021-05-19 | Disposition: A | Discharge status: Home / Self Care | Payer: Medicare HMO | Source: Ambulatory Visit | Attending: Family Medicine | Admitting: Family Medicine

## 2021-05-19 DIAGNOSIS — N289 Disorder of kidney and ureter, unspecified: Secondary | ICD-10-CM

## 2021-05-19 DIAGNOSIS — Z780 Asymptomatic menopausal state: Secondary | ICD-10-CM | POA: Insufficient documentation

## 2021-05-26 ENCOUNTER — Telehealth: Payer: Self-pay | Admitting: Family Medicine

## 2021-05-26 NOTE — Telephone Encounter (Signed)
Patient states she would like her nephrologist referral to be sent over to [facility] on [address] because it is closer to her. Please advice.

## 2021-06-12 ENCOUNTER — Ambulatory Visit: Payer: Medicare HMO | Admitting: Orthopedic Surgery

## 2021-06-26 DIAGNOSIS — D631 Anemia in chronic kidney disease: Secondary | ICD-10-CM | POA: Diagnosis not present

## 2021-06-26 DIAGNOSIS — N1832 Chronic kidney disease, stage 3b: Secondary | ICD-10-CM | POA: Diagnosis not present

## 2021-06-26 DIAGNOSIS — N183 Chronic kidney disease, stage 3 unspecified: Secondary | ICD-10-CM | POA: Diagnosis not present

## 2021-06-26 DIAGNOSIS — I129 Hypertensive chronic kidney disease with stage 1 through stage 4 chronic kidney disease, or unspecified chronic kidney disease: Secondary | ICD-10-CM | POA: Diagnosis not present

## 2021-06-26 DIAGNOSIS — N289 Disorder of kidney and ureter, unspecified: Secondary | ICD-10-CM | POA: Diagnosis not present

## 2021-06-26 DIAGNOSIS — N1831 Chronic kidney disease, stage 3a: Secondary | ICD-10-CM | POA: Diagnosis not present

## 2021-07-16 ENCOUNTER — Other Ambulatory Visit: Payer: Self-pay | Admitting: Family Medicine

## 2021-07-17 ENCOUNTER — Ambulatory Visit: Payer: Medicare HMO | Admitting: Orthopedic Surgery

## 2021-07-17 ENCOUNTER — Encounter: Payer: Self-pay | Admitting: Orthopedic Surgery

## 2021-07-17 ENCOUNTER — Other Ambulatory Visit: Payer: Self-pay

## 2021-07-17 ENCOUNTER — Ambulatory Visit (INDEPENDENT_AMBULATORY_CARE_PROVIDER_SITE_OTHER): Payer: Medicare HMO

## 2021-07-17 DIAGNOSIS — G8929 Other chronic pain: Secondary | ICD-10-CM

## 2021-07-17 DIAGNOSIS — M25512 Pain in left shoulder: Secondary | ICD-10-CM | POA: Diagnosis not present

## 2021-07-17 DIAGNOSIS — M7542 Impingement syndrome of left shoulder: Secondary | ICD-10-CM

## 2021-07-17 NOTE — Progress Notes (Signed)
Office Visit Note   Patient: [name]           Date of Birth: [birth year]           MRN: [id number] Visit Date: 07/17/2021              Requested by: Mosie Lukes, MD [address] [place],  [place] [address] PCP: Mosie Lukes, MD  Chief Complaint  Patient presents with   Right Knee - Pain   Left Shoulder - Pain      HPI: Patient is a 71 year old woman who is seen for evaluation for a 76-month history of left shoulder pain.  Patient denies any injury she has been using Voltaren and lidocaine as well as heat without relief.  She states she has difficulty sleeping at night.  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  Assessment & Plan: Visit Diagnoses:  1. Chronic left shoulder pain   2. Impingement syndrome of left shoulder     Plan: The left shoulder was injected she tolerated this well reevaluate in 4 months.  Discussed that we may need to obtain an MRI scan to look for rotator cuff pathology.  Follow-Up Instructions: Return in about 4 weeks (around 08/14/2021).   Ortho Exam  Patient is alert, oriented, no adenopathy, well-dressed, normal affect, normal respiratory effort. Examination patient has active abduction flexion to 70 degrees passively to 90 degrees.  She has 30 degrees of internal and external rotation and has pain with Neer and Hawkins impingement test she has pain to palpation over the biceps tendon clinically no adhesive capsulitis.  Imaging: XR Shoulder Left  Result Date: 07/17/2021 2 view radiographs of the left shoulder shows a congruent glenohumeral space without osteophytic bone spurs a good acromial space.  No images are attached to the encounter.  Labs: Lab Results  Component Value Date   HGBA1C 5.8 04/24/2021   HGBA1C 5.6 09/19/2020   HGBA1C 6.1 07/25/2019   LABORGA NO GROWTH 01/22/2014     Lab Results   Component Value Date   ALBUMIN 4.1 05/16/2021   ALBUMIN 4.3 04/24/2021   ALBUMIN 4.3 04/03/2021    Lab Results  Component Value Date   MG 1.8 02/21/2021   MG 2.2 03/15/2007   No results found for: VD25OH  No results found for: PREALBUMIN CBC EXTENDED Latest Ref Rng & Units 04/24/2021 09/19/2020 10/06/2019  WBC 4.0 - 10.5 K/uL 5.3 5.8 10.0  RBC 3.87 - 5.11 Mil/uL 3.81(L) 3.92 3.82(L)  HGB 12.0 - 15.0 g/dL 11.6(L) 12.4 12.2  HCT 36.0 - 46.0 % 35.2(L) 37.3 35.8(L)  PLT 150.0 - 400.0 K/uL 236.0 217 163  NEUTROABS 1.4 - 7.7 K/uL 2.0 2,952 7.6  LYMPHSABS 0.7 - 4.0 K/uL 2.7 2,210 1.8     There is no height or weight on file to calculate BMI.  Orders:  Orders Placed This Encounter  Procedures   XR Shoulder Left   No orders of the defined types were placed in this encounter.    Procedures: No procedures performed  Clinical Data: No additional findings.  ROS:  All other systems negative, except as noted in the HPI. Review of Systems  Objective: Vital Signs: There were no vitals taken for this visit.  Specialty Comments:  No specialty comments available.  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   Transient neurologic deficit 08/18/2017   Obesity 02/08/2017   Palpitations 02/04/2017   Sleep apnea 10/01/2016   Diverticulosis 01/26/2014   Benign paroxysmal positional vertigo 11/14/2013   Urinary frequency 09/02/2013   Post-menopause 09/02/2013   H/O tobacco use, presenting hazards to health 09/02/2013   GERD (gastroesophageal reflux disease) 09/02/2013   Personal history of colonic polyps 09/02/2013   Preventative health care 11/28/2012   RHINITIS 06/27/2010   GLUCOSE INTOLERANCE  12/22/2007   Hyperlipidemia 12/20/2007   Essential hypertension, benign 12/20/2007   ARTHRITIS 12/20/2007   KNEE SPRAIN 12/20/2007   CARCINOMA, BASAL CELL, HX OF 12/20/2007   ARRHYTHMIA, HX OF 12/20/2007   GLAUCOMA, HX OF 12/20/2007   Past Medical History:  Diagnosis Date   Arthritis    Benign paroxysmal positional vertigo 11/14/2013   Colon polyp    Depression    Diverticulosis    Essential hypertension, benign    GERD (gastroesophageal reflux disease) 09/02/2013   GI bleed    Glaucoma    Glucose intolerance (impaired glucose tolerance)    H/O tobacco use, presenting hazards to health 09/02/2013   History  of basal cell carcinoma    History of cardiac arrhythmia    Hyperlipidemia    Hypertension    Medicare welcome visit 11/28/2012   Obesity 02/08/2017   Palpitations 02/04/2017   Personal history of colonic polyps 09/02/2013   Patient reports H/O 2 colonoscopies, last one dates back to 2012 in [place] She reports they were labeled as incomplete.  Reports 1st colonoscopy had 1 benign polyp and the 2nd was clear.    Renal insufficiency 04/28/2021   Sleep apnea 10/01/2016   Not on CPAP machine    Stroke [facility])    UTI (urinary tract infection)     Family History  Problem Relation Age of Onset   Coronary artery disease Paternal Grandmother    Hyperlipidemia Paternal Grandmother    Hypertension Paternal Grandmother    Stroke Paternal Grandmother    Hypertension Mother    Lung cancer Mother        smoker   Uterine cancer Mother    Birth defects Mother        lung cancer in smoker   COPD Mother    Hypertension Father    Kidney disease Father        Renal Cell Carcinoma   Birth defects Father        renal cell CA, smoker   Hypertension Maternal Grandmother    Hypertension Maternal Grandfather    Hypertension Paternal Grandfather    Dementia Paternal Grandfather    Cancer Brother        testicular   Heart disease Brother        NI, smoker   Hyperlipidemia Other    Colon cancer  Neg Hx    Esophageal cancer Neg Hx     Past Surgical History:  Procedure Laterality Date   CAROTID STENT     CERVICAL CONE BIOPSY  1960s   COLONOSCOPY  2016   DILATION AND CURETTAGE OF UTERUS  1966   KNEE SURGERY Left 11/01/2008   left knee total replacement Dr Sharol Given   WISDOM TOOTH EXTRACTION     Social History   Occupational History   Occupation: Retired   Tobacco  Use   Smoking status: Former    Packs/day: 1.00    Years: 15.00    Pack years: 15.00    Types: Cigarettes    Quit date: 06/08/1982    Years since quitting: 39.1   Smokeless tobacco: Never  Vaping Use   Vaping Use: Never used  Substance and Sexual Activity   Alcohol use: Yes    Comment: 1-2 glasses of wine per day   Drug use: No   Sexual activity: Not Currently    Birth control/protection: Post-menopausal    Comment: lives alone, no dietary restrictions except no lactose,

## 2021-07-21 ENCOUNTER — Other Ambulatory Visit: Payer: Self-pay | Admitting: Family Medicine

## 2021-07-21 ENCOUNTER — Encounter: Payer: Self-pay | Admitting: Family Medicine

## 2021-07-24 ENCOUNTER — Ambulatory Visit: Payer: Medicare HMO

## 2021-07-24 ENCOUNTER — Other Ambulatory Visit (HOSPITAL_BASED_OUTPATIENT_CLINIC_OR_DEPARTMENT_OTHER): Payer: Self-pay

## 2021-07-24 VITALS — BP 188/84 | HR 74

## 2021-07-24 DIAGNOSIS — I10 Essential (primary) hypertension: Secondary | ICD-10-CM

## 2021-07-24 MED ORDER — METOPROLOL TARTRATE 100 MG PO TABS
100.0000 mg | ORAL_TABLET | Freq: Two times a day (BID) | ORAL | 3 refills | Status: DC
  Filled 2021-07-24: qty 180, 90d supply, fill #0

## 2021-07-24 NOTE — Progress Notes (Signed)
BP Readings from Last 3 Encounters:  04/28/21 (!) 156/66  01/30/21 120/62  09/19/20 108/70   Patient here for BP check. Message was sent by patient 2-13 reporting elevated readings of up to 178/95. Per Dr. Charlett Blake patient to come in for BP check and to compare reading to her BP home monitor.  BP with patinet's home monitor today is 201/104 Bp today manually is 188/84 with a pulse of 74 Patient currently takes HCTZ 12.5 Lopressor 50 mg bid.  Patient was advised the home monitor was reading elevated. Both her home monitor and our manual were check more than once to confirm.   Patient advised per Dr. Leodis Liverpool will be increased to 100 mg bid and to continue the same amount of HCTZ.  Also advised to work on her diet using the DASH diet.

## 2021-07-29 NOTE — Telephone Encounter (Signed)
Opened in error

## 2021-08-08 ENCOUNTER — Ambulatory Visit (INDEPENDENT_AMBULATORY_CARE_PROVIDER_SITE_OTHER): Payer: Medicare HMO | Admitting: Family

## 2021-08-08 VITALS — BP 144/60 | HR 67 | Temp 98.4°F | Resp 16 | Wt 189.0 lb

## 2021-08-08 DIAGNOSIS — R059 Cough, unspecified: Secondary | ICD-10-CM | POA: Diagnosis not present

## 2021-08-08 DIAGNOSIS — I10 Essential (primary) hypertension: Secondary | ICD-10-CM | POA: Diagnosis not present

## 2021-08-08 NOTE — Progress Notes (Signed)
Subjective:   By signing my name below, I, Shehryar Baig, attest that this documentation has been prepared under the direction and in the presence of Debbrah Alar NP. 08/08/2021     Patient ID: [name], female    DOB: 1951-04-04, 71 y.o.   MRN: [id number]  Chief Complaint  Patient presents with   Hypertension    Lopresor increased to 100mg  from 2 during nurse visit on 07-24-21 per Dr Etter Sjogren.     HPI Patient is in today for an office visit.  Blood Pressure - Last visit metoprolol dose was increased. Pt reports varying bp readings at home. Patient has complained of increased coughing during the night since increasing to 100 MG of metoprolol tartrate.  BP Readings from Last 3 Encounters:  08/08/21 (!) 144/60  07/24/21 (!) 188/84  04/28/21 (!) 156/66   Pulse Readings from Last 3 Encounters:  08/08/21 67  07/24/21 74  09/19/20 92   Refills - Patient does not require additional refills.   Health Maintenance Due  Topic Date Due   Zoster Vaccines- Shingrix (1 of 2) Never done    Past Medical History:  Diagnosis Date   Arthritis    Benign paroxysmal positional vertigo 11/14/2013   Colon polyp    Depression    Diverticulosis    Essential hypertension, benign    GERD (gastroesophageal reflux disease) 09/02/2013   GI bleed    Glaucoma    Glucose intolerance (impaired glucose tolerance)    H/O tobacco use, presenting hazards to health 09/02/2013   History  of basal cell carcinoma    History of cardiac arrhythmia    Hyperlipidemia    Hypertension    Medicare welcome visit 11/28/2012   Obesity 02/08/2017   Palpitations 02/04/2017   Personal history of colonic polyps 09/02/2013   Patient reports H/O 2 colonoscopies, last one dates back to 2012 in [place] She reports they were labeled as incomplete.  Reports 1st colonoscopy had 1 benign polyp and the 2nd was clear.    Renal insufficiency 04/28/2021   Sleep apnea 10/01/2016   Not on CPAP machine    Stroke [facility])    UTI  (urinary tract infection)     Past Surgical History:  Procedure Laterality Date   CAROTID STENT     CERVICAL CONE BIOPSY  1960s   COLONOSCOPY  2016   DILATION AND CURETTAGE OF UTERUS  1966   KNEE SURGERY Left 11/01/2008   left knee total replacement Dr Sharol Given   WISDOM TOOTH EXTRACTION      Family History  Problem Relation Age of Onset   Coronary artery disease Paternal Grandmother    Hyperlipidemia Paternal Grandmother    Hypertension Paternal Grandmother    Stroke Paternal Grandmother    Hypertension Mother    Lung cancer Mother        smoker   Uterine cancer Mother    Birth defects Mother        lung cancer in smoker   COPD Mother    Hypertension Father    Kidney disease Father        Renal Cell Carcinoma   Birth defects Father        renal cell CA, smoker   Hypertension Maternal Grandmother    Hypertension Maternal Grandfather    Hypertension Paternal Grandfather    Dementia Paternal Grandfather    Cancer Brother        testicular   Heart disease Brother  NI, smoker   Hyperlipidemia Other    Colon cancer Neg Hx    Esophageal cancer Neg Hx     Social History   Socioeconomic History   Marital status: Divorced    Spouse name: Not on file   Number of children: 0   Years of education: Not on file   Highest education level: Not on file  Occupational History   Occupation: Retired   Tobacco Use   Smoking status: Former    Packs/day: 1.00    Years: 15.00    Pack years: 15.00    Types: Cigarettes    Quit date: 06/08/1982    Years since quitting: 39.1   Smokeless tobacco: Never  Vaping Use   Vaping Use: Never used  Substance and Sexual Activity   Alcohol use: Yes    Comment: 1-2 glasses of wine per day   Drug use: No   Sexual activity: Not Currently    Birth control/protection: Post-menopausal    Comment: lives alone, no dietary restrictions except no lactose,  Other Topics Concern   Not on file  Social History Narrative   Occupation: unemployed  Product manager- now retired   Divorced   no children    Moved from [place] alone with 4 cats   Enjoys gardening, planting.             Social Determinants of Health   Financial Resource Strain: Low Risk    Difficulty of Paying Living Expenses: Not hard at all  Food Insecurity: No Food Insecurity   Worried About Charity fundraiser in the Last Year: Never true   [place] in the Last Year: Never true  Transportation Needs: No Transportation Needs   Lack of Transportation (Medical): No   Lack of Transportation (Non-Medical): No  Physical Activity: Inactive   Days of Exercise per Week: 0 days   Minutes of Exercise per Session: 0 min  Stress: No Stress Concern Present   Feeling of Stress : Not at all  Social Connections: Socially Isolated   Frequency of Communication with Friends and Family: More than three times a week   Frequency of Social Gatherings with Friends and Family: More than three times a week   Attends Religious Services: Never   Marine scientist or Organizations: No   Attends Music therapist: Never   Marital Status: Divorced  Human resources officer Violence: Not At Risk   Fear of Current or Ex-Partner: No   Emotionally Abused: No   Physically Abused: No   Sexually Abused: No    Outpatient Medications Prior to Visit  Medication Sig Dispense Refill   Acetylcysteine (N-ACETYL-L-CYSTEINE) 600 MG CAPS      albuterol (VENTOLIN HFA) 108 (90 Base) MCG/ACT inhaler Inhale 2 puffs into the lungs every 6 (six) hours as needed for wheezing or shortness of breath. 8 g 0   aspirin 81 MG tablet Take 81 mg by mouth daily.     COVID-19 mRNA bivalent vaccine, Pfizer, (PFIZER COVID-19 VAC BIVALENT) injection Inject into the muscle. 0.3 mL 0   fish oil-omega-3 fatty acids 1000 MG capsule Take 1 g by mouth daily.      hydrochlorothiazide (MICROZIDE) 12.5 MG capsule Take 1 capsule by mouth once daily 90 capsule 0   hyoscyamine (ANASPAZ) 0.125 MG  TBDP disintergrating tablet Place 1 tablet (0.125 mg total) under the tongue every 4 (four) hours as needed. 30 tablet 1   lovastatin (MEVACOR) 40 MG  tablet Take 1 tablet (40 mg total) by mouth at bedtime. 90 tablet 3   metoprolol tartrate (LOPRESSOR) 100 MG tablet Take 1 tablet (100 mg total) by mouth 2 (two) times daily. 180 tablet 3   Multiple Vitamins-Minerals (MULTIVITAMIN WOMEN PO) Take 1 tablet by mouth daily.     Oxymetazoline HCl (NASAL SPRAY) 0.05 % SOLN Place into the nose.     tretinoin (RETIN-A) 0.1 % cream APPLY TO AFFECTED AREA(S) ONE TIME DAILY AT BEDTIME USING PEA SIZED AMOUNT TO FACE 45 g 5   nitrofurantoin, macrocrystal-monohydrate, (MACROBID) 100 MG capsule Take 1 capsule (100 mg total) by mouth 2 (two) times daily. 10 capsule 0   No facility-administered medications prior to visit.    No Known Allergies  Review of Systems  Respiratory:  Positive for cough.       Objective:    Physical Exam Constitutional:      General: She is not in acute distress.    Appearance: Normal appearance. She is not ill-appearing.  HENT:     Head: Normocephalic and atraumatic.     Right Ear: External ear normal.     Left Ear: External ear normal.  Eyes:     Extraocular Movements: Extraocular movements intact.     Pupils: Pupils are equal, round, and reactive to light.  Cardiovascular:     Rate and Rhythm: Normal rate and regular rhythm.     Heart sounds: Normal heart sounds. No murmur heard.   No gallop.  Pulmonary:     Effort: Pulmonary effort is normal. No respiratory distress.     Breath sounds: Normal breath sounds. No wheezing or rales.  Skin:    General: Skin is warm and dry.  Neurological:     Mental Status: She is alert and oriented to person, place, and time.  Psychiatric:        Judgment: Judgment normal.    BP (!) 144/60 (BP Location: Left Arm, Patient Position: Sitting, Cuff Size: Small)    Pulse 67    Temp 98.4 F (36.9 C) (Oral)    Resp 16    Wt 189 lb (85.7  kg)    SpO2 97%    BMI 35.71 kg/m  Wt Readings from Last 3 Encounters:  08/08/21 189 lb (85.7 kg)  04/28/21 181 lb (82.1 kg)  01/30/21 181 lb (82.1 kg)       Assessment & Plan:   Problem List Items Addressed This Visit       Unprioritized   Essential hypertension, benign    BP Readings from Last 3 Encounters:  08/08/21 (!) 144/60  07/24/21 (!) 188/84  04/28/21 (!) 156/66  BP is much improved. Continue current dose of metoprolol.        Cough - Primary    Reports mild cough last few weeks, worse with laying down.  Suspect due to seasonal allergies. Recommended trial of claritin 10mg  once daily. She is advised to let us know if this does not improve her symptoms.          No orders of the defined types were placed in this encounter.   I, Nance Pear, NP, personally preformed the services described in this documentation.  All medical record entries made by the scribe were at my direction and in my presence.  I have reviewed the chart and discharge instructions (if applicable) and agree that the record reflects my personal performance and is accurate and complete. 08/08/2021   I,Shehryar Baig,acting as a scribe for  Nance Pear, NP.,have documented all relevant documentation on the behalf of Nance Pear, NP,as directed by  Nance Pear, NP while in the presence of Nance Pear, NP.     Nance Pear, NP

## 2021-08-08 NOTE — Assessment & Plan Note (Signed)
Reports mild cough last few weeks, worse with laying down.  Suspect due to seasonal allergies. Recommended trial of claritin 10mg  once daily. She is advised to let us know if this does not improve her symptoms.  ?

## 2021-08-08 NOTE — Assessment & Plan Note (Addendum)
BP Readings from Last 3 Encounters:  ?08/08/21 (!) 144/60  ?07/24/21 (!) 188/84  ?04/28/21 (!) 156/66  ? ?BP is much improved. Continue current dose of metoprolol.   ?

## 2021-08-14 ENCOUNTER — Ambulatory Visit: Payer: Medicare HMO | Admitting: Orthopedic Surgery

## 2021-09-08 ENCOUNTER — Telehealth: Payer: Self-pay | Admitting: Family Medicine

## 2021-09-08 DIAGNOSIS — N289 Disorder of kidney and ureter, unspecified: Secondary | ICD-10-CM

## 2021-09-08 NOTE — Telephone Encounter (Signed)
Pt is requesting via my chart to have kidney function labs this month. ? ?Please advise. ?

## 2021-09-08 NOTE — Telephone Encounter (Signed)
CMP ordered. Called pt and left VM asking for return call to schedule lab appointment. ?

## 2021-09-09 ENCOUNTER — Other Ambulatory Visit (INDEPENDENT_AMBULATORY_CARE_PROVIDER_SITE_OTHER): Payer: Medicare HMO

## 2021-09-09 DIAGNOSIS — N289 Disorder of kidney and ureter, unspecified: Secondary | ICD-10-CM

## 2021-09-10 LAB — COMPREHENSIVE METABOLIC PANEL
ALT: 9 U/L (ref 0–35)
AST: 16 U/L (ref 0–37)
Albumin: 4.3 g/dL (ref 3.5–5.2)
Alkaline Phosphatase: 45 U/L (ref 39–117)
BUN: 33 mg/dL — ABNORMAL HIGH (ref 6–23)
CO2: 28 mEq/L (ref 19–32)
Calcium: 9.9 mg/dL (ref 8.4–10.5)
Chloride: 103 mEq/L (ref 96–112)
Creatinine, Ser: 1.5 mg/dL — ABNORMAL HIGH (ref 0.40–1.20)
GFR: 34.42 mL/min — ABNORMAL LOW (ref >60.00)
Glucose, Bld: 98 mg/dL (ref 70–99)
Potassium: 4 mEq/L (ref 3.5–5.1)
Sodium: 140 mEq/L (ref 135–145)
Total Bilirubin: 0.3 mg/dL (ref 0.2–1.2)
Total Protein: 6.6 g/dL (ref 6.0–8.3)

## 2021-10-16 DIAGNOSIS — Z95828 Presence of other vascular implants and grafts: Secondary | ICD-10-CM | POA: Diagnosis not present

## 2021-10-16 DIAGNOSIS — E785 Hyperlipidemia, unspecified: Secondary | ICD-10-CM | POA: Diagnosis not present

## 2021-10-16 DIAGNOSIS — I6523 Occlusion and stenosis of bilateral carotid arteries: Secondary | ICD-10-CM | POA: Diagnosis not present

## 2021-10-16 DIAGNOSIS — I6521 Occlusion and stenosis of right carotid artery: Secondary | ICD-10-CM | POA: Diagnosis not present

## 2021-10-16 DIAGNOSIS — I10 Essential (primary) hypertension: Secondary | ICD-10-CM | POA: Diagnosis not present

## 2021-10-20 ENCOUNTER — Other Ambulatory Visit: Payer: Self-pay | Admitting: Family Medicine

## 2021-10-23 ENCOUNTER — Other Ambulatory Visit: Payer: Self-pay | Admitting: Family Medicine

## 2021-10-24 ENCOUNTER — Other Ambulatory Visit: Payer: Self-pay | Admitting: *Deleted

## 2021-10-24 ENCOUNTER — Telehealth: Payer: Self-pay | Admitting: Family Medicine

## 2021-10-24 DIAGNOSIS — I10 Essential (primary) hypertension: Secondary | ICD-10-CM

## 2021-10-24 MED ORDER — METOPROLOL TARTRATE 100 MG PO TABS: 100.0000 mg | ORAL_TABLET | Freq: Two times a day (BID) | ORAL | 3 refills | Status: DC

## 2021-10-24 NOTE — Telephone Encounter (Signed)
Refill sent.

## 2021-10-24 NOTE — Telephone Encounter (Signed)
Patient stating pharm is saying we are denying medication??? But I see 3 refillss please advise    metoprolol tartrate (LOPRESSOR) 100 MG tablet [[id number]]

## 2021-11-05 ENCOUNTER — Encounter: Payer: Self-pay | Admitting: Family Medicine

## 2021-11-26 NOTE — Progress Notes (Deleted)
Subjective:    Patient ID: [name], female    DOB: 12/01/1950, 71 y.o.   MRN: [id number]  No chief complaint on file.   HPI Patient is in today for her annual physical exam.  Past Medical History:  Diagnosis Date   Arthritis    Benign paroxysmal positional vertigo 11/14/2013   Colon polyp    Depression    Diverticulosis    Essential hypertension, benign    GERD (gastroesophageal reflux disease) 09/02/2013   GI bleed    Glaucoma    Glucose intolerance (impaired glucose tolerance)    H/O tobacco use, presenting hazards to health 09/02/2013   History  of basal cell carcinoma    History of cardiac arrhythmia    Hyperlipidemia    Hypertension    Medicare welcome visit 11/28/2012   Obesity 02/08/2017   Palpitations 02/04/2017   Personal history of colonic polyps 09/02/2013   Patient reports H/O 2 colonoscopies, last one dates back to 2012 in [place] She reports they were labeled as incomplete.  Reports 1st colonoscopy had 1 benign polyp and the 2nd was clear.    Renal insufficiency 04/28/2021   Sleep apnea 10/01/2016   Not on CPAP machine    Stroke [facility])    UTI (urinary tract infection)     Past Surgical History:  Procedure Laterality Date   CAROTID STENT     CERVICAL CONE BIOPSY  1960s   COLONOSCOPY  2016   DILATION AND CURETTAGE OF UTERUS  1966   KNEE SURGERY Left 11/01/2008   left knee total replacement Dr Sharol Given   WISDOM TOOTH EXTRACTION      Family History  Problem Relation Age of Onset   Coronary artery disease Paternal Grandmother    Hyperlipidemia Paternal Grandmother    Hypertension Paternal Grandmother    Stroke Paternal Grandmother    Hypertension Mother    Lung cancer Mother        smoker   Uterine cancer Mother    Birth defects Mother        lung cancer in smoker   COPD Mother    Hypertension Father    Kidney disease Father        Renal Cell Carcinoma   Birth defects Father        renal cell CA, smoker   Hypertension Maternal Grandmother     Hypertension Maternal Grandfather    Hypertension Paternal Grandfather    Dementia Paternal Grandfather    Cancer Brother        testicular   Heart disease Brother        NI, smoker   Hyperlipidemia Other    Colon cancer Neg Hx    Esophageal cancer Neg Hx     Social History   Socioeconomic History   Marital status: Divorced    Spouse name: Not on file   Number of children: 0   Years of education: Not on file   Highest education level: Not on file  Occupational History   Occupation: Retired   Tobacco Use   Smoking status: Former    Packs/day: 1.00    Years: 15.00    Total pack years: 15.00    Types: Cigarettes    Quit date: 06/08/1982    Years since quitting: 39.4   Smokeless tobacco: Never  Vaping Use   Vaping Use: Never used  Substance and Sexual Activity   Alcohol use: Yes    Comment: 1-2 glasses of wine per day   Drug  use: No   Sexual activity: Not Currently    Birth control/protection: Post-menopausal    Comment: lives alone, no dietary restrictions except no lactose,  Other Topics Concern   Not on file  Social History Narrative   Occupation: unemployed Product manager- now retired   Divorced   no children    Moved from [place] alone with 4 cats   Enjoys gardening, planting.             Social Determinants of Health   Financial Resource Strain: Low Risk  (04/28/2021)   Overall Financial Resource Strain (CARDIA)    Difficulty of Paying Living Expenses: Not hard at all  Food Insecurity: No Food Insecurity (04/28/2021)   Hunger Vital Sign    Worried About Running Out of Food in the Last Year: Never true    Ran Out of Food in the Last Year: Never true  Transportation Needs: No Transportation Needs (04/28/2021)   PRAPARE - Hydrologist (Medical): No    Lack of Transportation (Non-Medical): No  Physical Activity: Inactive (04/28/2021)   Exercise Vital Sign    Days of Exercise per Week: 0 days    Minutes of  Exercise per Session: 0 min  Stress: No Stress Concern Present (04/28/2021)   [place]    Feeling of Stress : Not at all  Social Connections: Socially Isolated (04/28/2021)   Social Connection and Isolation Panel [NHANES]    Frequency of Communication with Friends and Family: More than three times a week    Frequency of Social Gatherings with Friends and Family: More than three times a week    Attends Religious Services: Never    Marine scientist or Organizations: No    Attends Archivist Meetings: Never    Marital Status: Divorced  Human resources officer Violence: Not At Risk (04/28/2021)   Humiliation, Afraid, Rape, and Kick questionnaire    Fear of Current or Ex-Partner: No    Emotionally Abused: No    Physically Abused: No    Sexually Abused: No    Outpatient Medications Prior to Visit  Medication Sig Dispense Refill   Acetylcysteine (N-ACETYL-L-CYSTEINE) 600 MG CAPS      albuterol (VENTOLIN HFA) 108 (90 Base) MCG/ACT inhaler Inhale 2 puffs into the lungs every 6 (six) hours as needed for wheezing or shortness of breath. 8 g 0   aspirin 81 MG tablet Take 81 mg by mouth daily.     COVID-19 mRNA bivalent vaccine, Pfizer, (PFIZER COVID-19 VAC BIVALENT) injection Inject into the muscle. 0.3 mL 0   fish oil-omega-3 fatty acids 1000 MG capsule Take 1 g by mouth daily.      hydrochlorothiazide (MICROZIDE) 12.5 MG capsule Take 1 capsule by mouth once daily 90 capsule 0   hyoscyamine (ANASPAZ) 0.125 MG TBDP disintergrating tablet Place 1 tablet (0.125 mg total) under the tongue every 4 (four) hours as needed. 30 tablet 1   lovastatin (MEVACOR) 40 MG tablet Take 1 tablet (40 mg total) by mouth at bedtime. 90 tablet 3   metoprolol tartrate (LOPRESSOR) 100 MG tablet Take 1 tablet (100 mg total) by mouth 2 (two) times daily. 180 tablet 3   Multiple Vitamins-Minerals (MULTIVITAMIN WOMEN PO) Take 1 tablet by mouth  daily.     Oxymetazoline HCl (NASAL SPRAY) 0.05 % SOLN Place into the nose.     tretinoin (RETIN-A) 0.1 % cream APPLY TO AFFECTED AREA(S)  ONE TIME DAILY AT BEDTIME USING PEA SIZED AMOUNT TO FACE 45 g 5   No facility-administered medications prior to visit.    No Known Allergies  ROS     Objective:    Physical Exam  There were no vitals taken for this visit. Wt Readings from Last 3 Encounters:  08/08/21 189 lb (85.7 kg)  04/28/21 181 lb (82.1 kg)  01/30/21 181 lb (82.1 kg)    Diabetic Foot Exam - Simple   No data filed    Lab Results  Component Value Date   WBC 5.3 04/24/2021   HGB 11.6 (L) 04/24/2021   HCT 35.2 (L) 04/24/2021   PLT 236.0 04/24/2021   GLUCOSE 98 09/09/2021   CHOL 147 04/24/2021   TRIG 172.0 (H) 04/24/2021   HDL 46.60 04/24/2021   LDLDIRECT 131.8 10/10/2008   LDLCALC 66 04/24/2021   ALT 9 09/09/2021   AST 16 09/09/2021   NA 140 09/09/2021   K 4.0 09/09/2021   CL 103 09/09/2021   CREATININE 1.50 (H) 09/09/2021   BUN 33 (H) 09/09/2021   CO2 28 09/09/2021   TSH 3.35 04/24/2021   INR 1.1 10/06/2019   HGBA1C 5.8 04/24/2021    Lab Results  Component Value Date   TSH 3.35 04/24/2021   Lab Results  Component Value Date   WBC 5.3 04/24/2021   HGB 11.6 (L) 04/24/2021   HCT 35.2 (L) 04/24/2021   MCV 92.3 04/24/2021   PLT 236.0 04/24/2021   Lab Results  Component Value Date   NA 140 09/09/2021   K 4.0 09/09/2021   CO2 28 09/09/2021   GLUCOSE 98 09/09/2021   BUN 33 (H) 09/09/2021   CREATININE 1.50 (H) 09/09/2021   BILITOT 0.3 09/09/2021   ALKPHOS 45 09/09/2021   AST 16 09/09/2021   ALT 9 09/09/2021   PROT 6.6 09/09/2021   ALBUMIN 4.3 09/09/2021   CALCIUM 9.9 09/09/2021   ANIONGAP 11 10/06/2019   GFR 34.42 (L) 09/09/2021   Lab Results  Component Value Date   CHOL 147 04/24/2021   Lab Results  Component Value Date   HDL 46.60 04/24/2021   Lab Results  Component Value Date   LDLCALC 66 04/24/2021   Lab Results  Component  Value Date   TRIG 172.0 (H) 04/24/2021   Lab Results  Component Value Date   CHOLHDL 3 04/24/2021   Lab Results  Component Value Date   HGBA1C 5.8 04/24/2021       Assessment & Plan:   COLONOSCOPY: 12/27/19 PAP: 11/28/12 PSA: n/a DEXA: 05/19/21   Problem List Items Addressed This Visit   None   I am having Inaaya L. Demonte "Sheri" maintain her aspirin, fish oil-omega-3 fatty acids, Multiple Vitamins-Minerals (MULTIVITAMIN WOMEN PO), Nasal Spray, hyoscyamine, lovastatin, tretinoin, albuterol, N-Acetyl-L-Cysteine, Pfizer COVID-19 Vac Bivalent, hydrochlorothiazide, and metoprolol tartrate.  No orders of the defined types were placed in this encounter.

## 2021-11-27 ENCOUNTER — Encounter: Payer: Medicare HMO | Admitting: Family Medicine

## 2021-11-27 DIAGNOSIS — I10 Essential (primary) hypertension: Secondary | ICD-10-CM

## 2021-11-27 DIAGNOSIS — N289 Disorder of kidney and ureter, unspecified: Secondary | ICD-10-CM

## 2021-11-27 DIAGNOSIS — Z0000 Encounter for general adult medical examination without abnormal findings: Secondary | ICD-10-CM

## 2021-11-27 DIAGNOSIS — E785 Hyperlipidemia, unspecified: Secondary | ICD-10-CM

## 2021-12-19 ENCOUNTER — Encounter: Payer: Self-pay | Admitting: Family Medicine

## 2021-12-19 ENCOUNTER — Other Ambulatory Visit: Payer: Self-pay | Admitting: Family Medicine

## 2021-12-19 DIAGNOSIS — G4730 Sleep apnea, unspecified: Secondary | ICD-10-CM

## 2021-12-19 DIAGNOSIS — N183 Chronic kidney disease, stage 3 unspecified: Secondary | ICD-10-CM | POA: Diagnosis not present

## 2021-12-19 DIAGNOSIS — N200 Calculus of kidney: Secondary | ICD-10-CM | POA: Diagnosis not present

## 2021-12-19 DIAGNOSIS — I129 Hypertensive chronic kidney disease with stage 1 through stage 4 chronic kidney disease, or unspecified chronic kidney disease: Secondary | ICD-10-CM | POA: Diagnosis not present

## 2021-12-19 DIAGNOSIS — N281 Cyst of kidney, acquired: Secondary | ICD-10-CM | POA: Diagnosis not present

## 2021-12-24 DIAGNOSIS — Z1331 Encounter for screening for depression: Secondary | ICD-10-CM | POA: Diagnosis not present

## 2021-12-24 DIAGNOSIS — N183 Chronic kidney disease, stage 3 unspecified: Secondary | ICD-10-CM | POA: Diagnosis not present

## 2021-12-24 DIAGNOSIS — I129 Hypertensive chronic kidney disease with stage 1 through stage 4 chronic kidney disease, or unspecified chronic kidney disease: Secondary | ICD-10-CM | POA: Diagnosis not present

## 2021-12-24 DIAGNOSIS — N1832 Chronic kidney disease, stage 3b: Secondary | ICD-10-CM | POA: Diagnosis not present

## 2021-12-24 DIAGNOSIS — D631 Anemia in chronic kidney disease: Secondary | ICD-10-CM | POA: Diagnosis not present

## 2021-12-30 ENCOUNTER — Telehealth: Payer: Self-pay | Admitting: Family Medicine

## 2021-12-30 NOTE — Telephone Encounter (Signed)
Pt wanted a referral sent to [facility] on [address] and stated when she called them they did not have anything in their faxes. Looks like it may have been sent to [facility] instead. She stated correct fax number is [phone].

## 2022-01-21 ENCOUNTER — Other Ambulatory Visit: Payer: Self-pay | Admitting: Family Medicine

## 2022-01-21 MED ORDER — HYDROCHLOROTHIAZIDE 12.5 MG PO CAPS: 12.5000 mg | ORAL_CAPSULE | Freq: Every day | ORAL | 0 refills | Status: DC

## 2022-02-18 DIAGNOSIS — E669 Obesity, unspecified: Secondary | ICD-10-CM | POA: Diagnosis not present

## 2022-02-18 DIAGNOSIS — R0683 Snoring: Secondary | ICD-10-CM | POA: Diagnosis not present

## 2022-02-18 DIAGNOSIS — G4733 Obstructive sleep apnea (adult) (pediatric): Secondary | ICD-10-CM | POA: Diagnosis not present

## 2022-02-23 IMAGING — CT CT ABD-PELV W/ CM
2 of 5 series · 17 of 46 positions shown, 19 images · IV contrast (Omnipaque)
Comparison: 8011

CLINICAL DATA: Left lower quadrant pain

EXAM:
CT ABDOMEN AND PELVIS WITH CONTRAST
TECHNIQUE: Multidetector CT imaging of the abdomen and pelvis was performed
using the standard protocol following bolus administration of
intravenous contrast.
CONTRAST:  100mL OMNIPAQUE IOHEXOL 300 MG/ML  SOLN

[Series 2: axial st · axial · 0.83mm/px · z∈[-426,-36]mm · 14 of 88 slices shown, 16 images]
[im 5/88  soft-tissue]
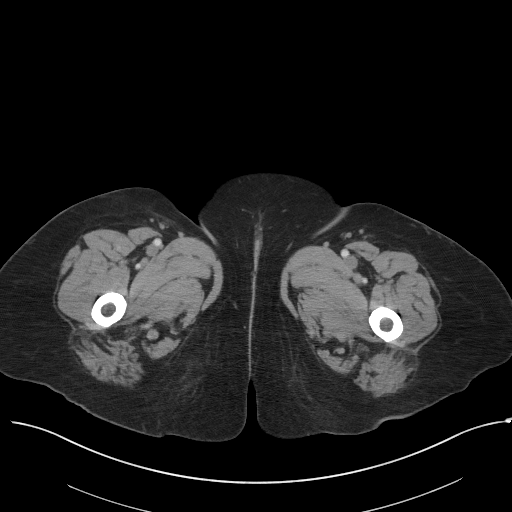
[im 5/88  bone]
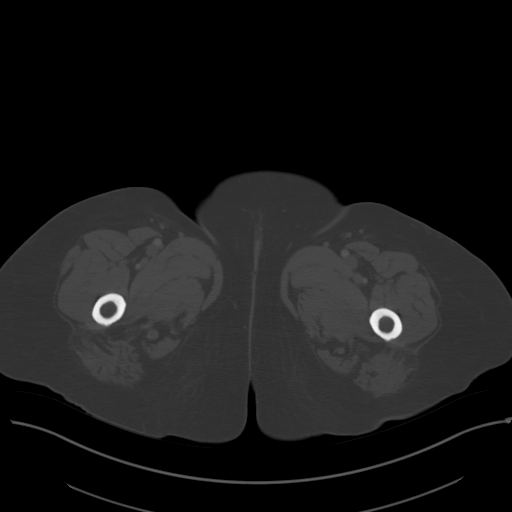
[im 10/88  soft-tissue]
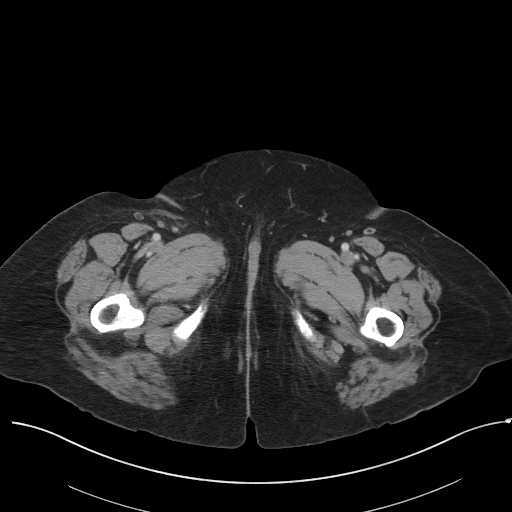
[im 20/88  soft-tissue]
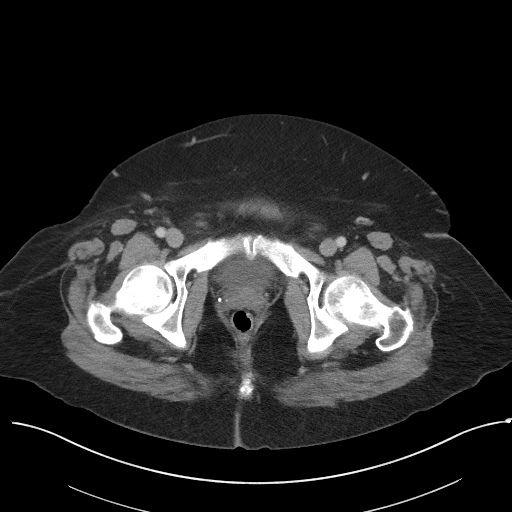
[im 25/88  soft-tissue]
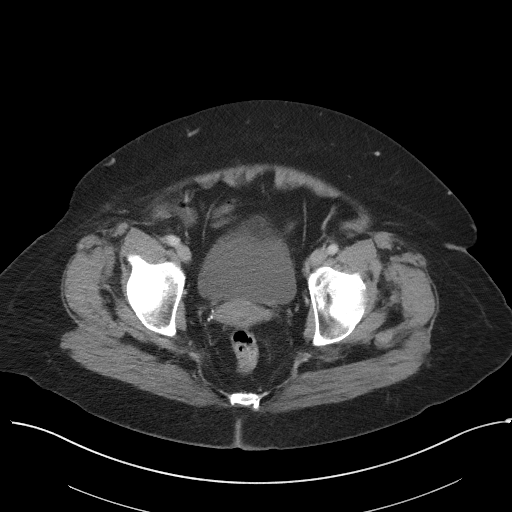
[im 30/88  soft-tissue]
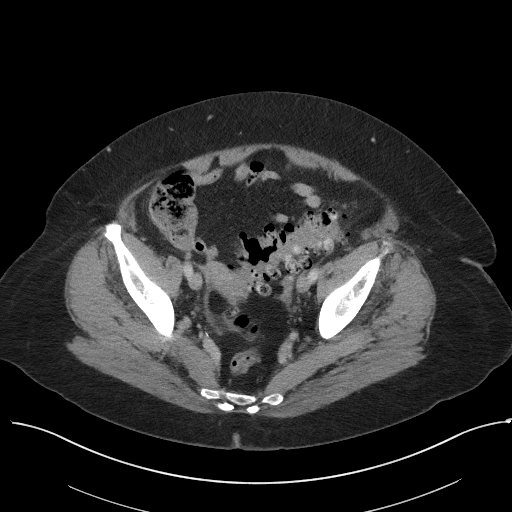
[im 34/88  soft-tissue]
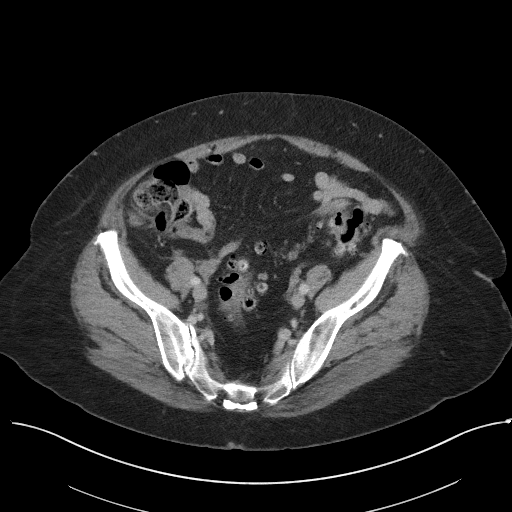
[im 39/88  soft-tissue]
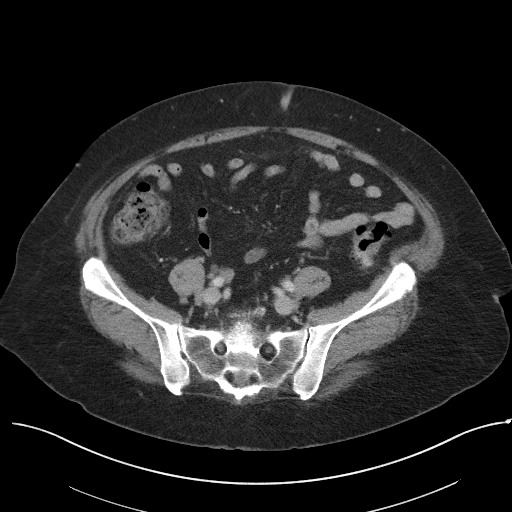
[im 49/88  soft-tissue]
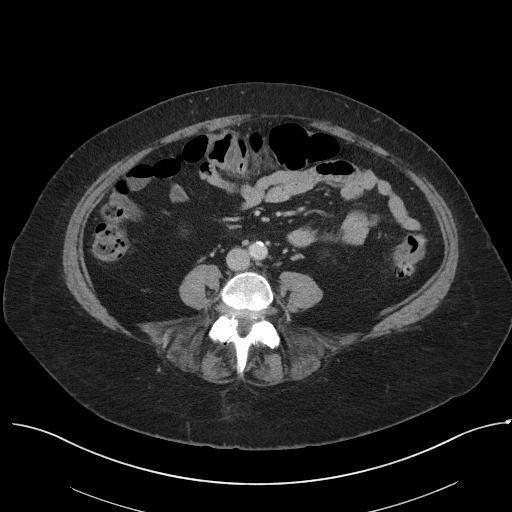
[im 54/88  soft-tissue]
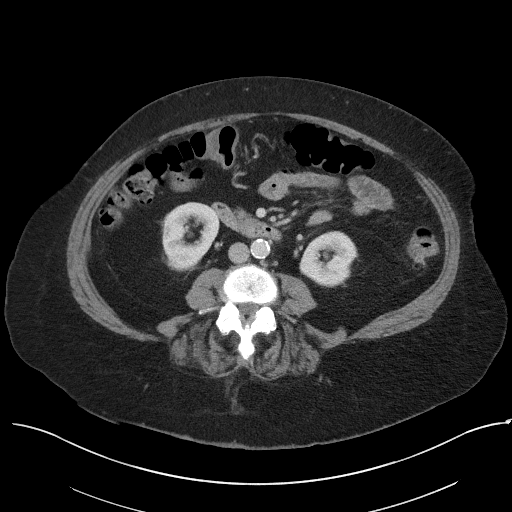
[im 54/88  bone]
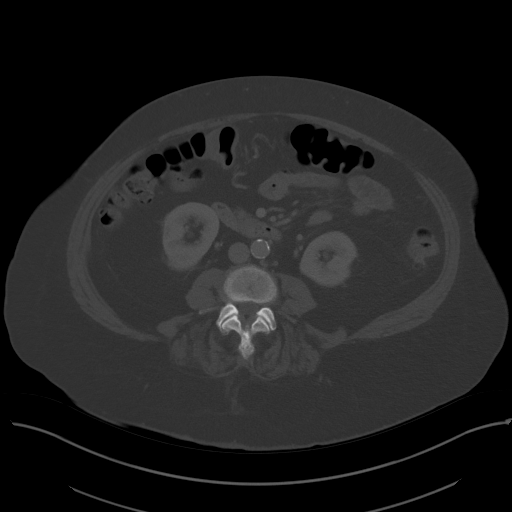
[im 59/88  soft-tissue]
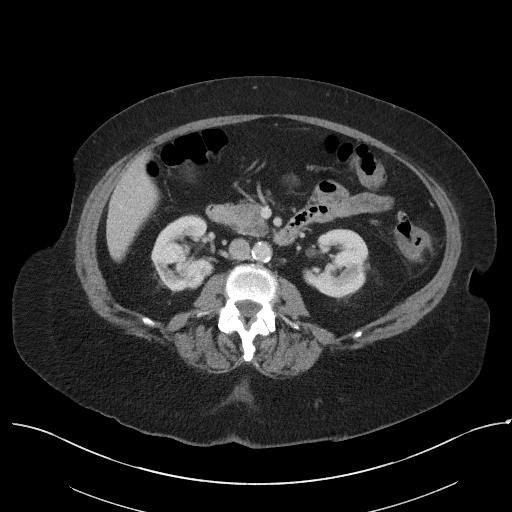
[im 63/88  soft-tissue]
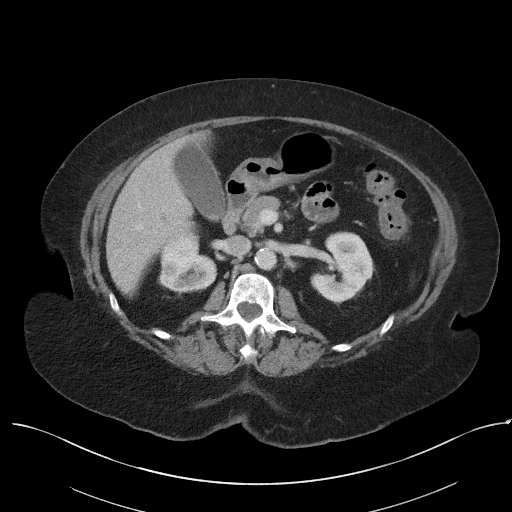
[im 68/88  soft-tissue]
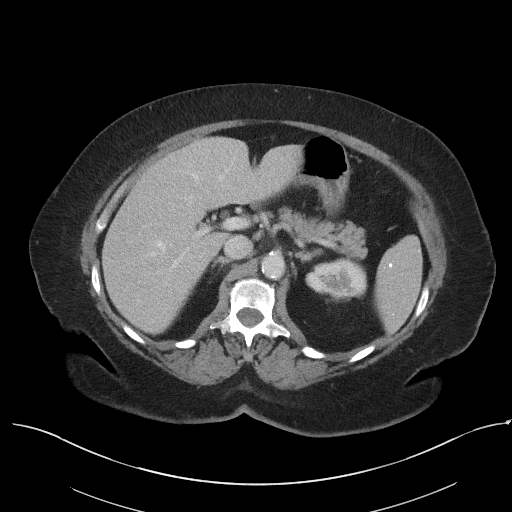
[im 78/88  soft-tissue]
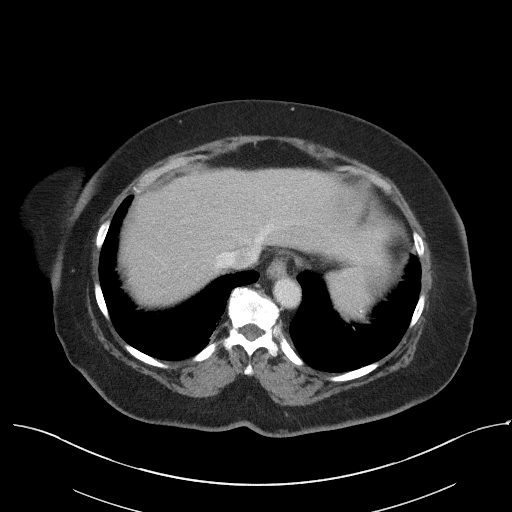
[im 83/88  soft-tissue]
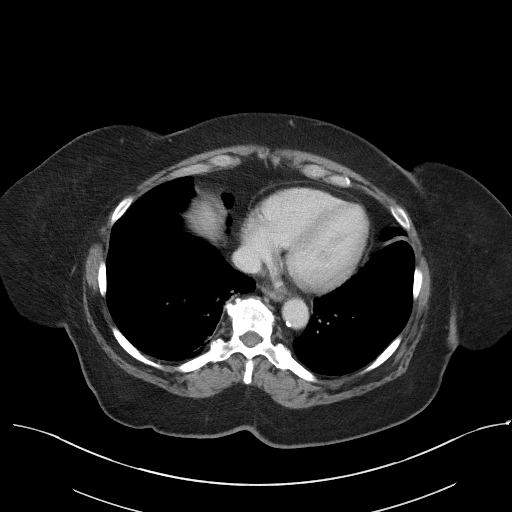

[Series 5: coronal st · coronal · 0.77mm/px · 3 of 101 slices shown]
[im 34/101  soft-tissue]
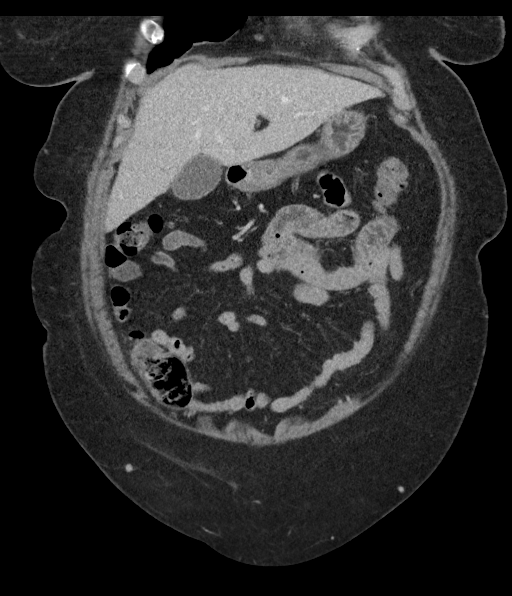
[im 45/101  soft-tissue]
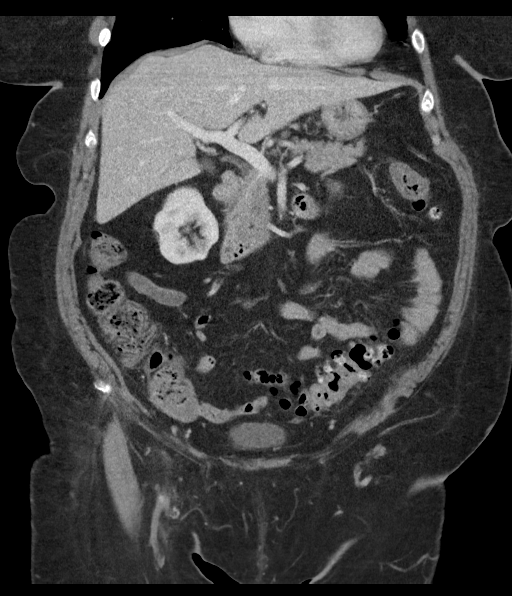
[im 56/101  soft-tissue]
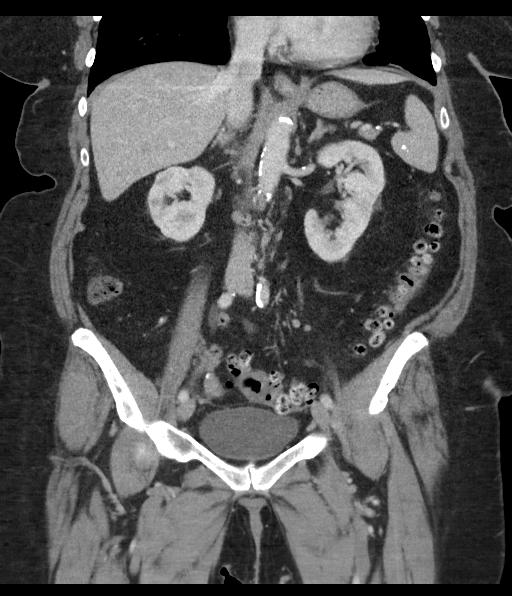

[17 of 46 positions shown; findings below may reference images not displayed]

FINDINGS: Lower chest: No acute abnormality.

Hepatobiliary: No focal liver abnormality is seen. No gallstones,
gallbladder wall thickening, or biliary dilatation.

Pancreas: Unremarkable.

Spleen: Multiple punctate calcified granulomas again seen. Otherwise
unremarkable.

Adrenals/Urinary Tract: Adrenals are unremarkable. Renal cortical
scarring. No hydronephrosis or mass. Bladder is unremarkable.

Stomach/Bowel: Stomach is within normal limits. Colonic
diverticulosis. There is mild infiltration of the fat adjacent to
the splenic flexure where mild wall thickening is also noted.

Vascular/Lymphatic: No significant vascular findings are present. No
enlarged abdominal or pelvic lymph nodes.

Reproductive: Uterus and bilateral adnexa are unremarkable.

Other: No ascites or free air.

Musculoskeletal: Degenerative changes of lumbar spine greatest at
L4-L5 and L5-S1.
IMPRESSION: Suspected acute colonic diverticulitis near the splenic flexure. No
evidence of perforation or abscess.

## 2022-03-09 ENCOUNTER — Other Ambulatory Visit (HOSPITAL_BASED_OUTPATIENT_CLINIC_OR_DEPARTMENT_OTHER): Payer: Self-pay | Admitting: Family Medicine

## 2022-03-09 ENCOUNTER — Telehealth: Payer: Self-pay | Admitting: Family Medicine

## 2022-03-09 DIAGNOSIS — Z1231 Encounter for screening mammogram for malignant neoplasm of breast: Secondary | ICD-10-CM

## 2022-03-09 NOTE — Telephone Encounter (Signed)
Pt is wanting to schedule her mammogram.

## 2022-03-09 NOTE — Telephone Encounter (Signed)
Left detailed message on machine for patient to call the imaging department to schedule at [phone]

## 2022-03-19 ENCOUNTER — Encounter: Payer: Self-pay | Admitting: Family Medicine

## 2022-03-19 ENCOUNTER — Other Ambulatory Visit: Payer: Self-pay

## 2022-03-19 MED ORDER — LOVASTATIN 40 MG PO TABS: 40.0000 mg | ORAL_TABLET | Freq: Every day | ORAL | 3 refills | Status: DC

## 2022-03-31 DIAGNOSIS — G4733 Obstructive sleep apnea (adult) (pediatric): Secondary | ICD-10-CM | POA: Diagnosis not present

## 2022-04-01 DIAGNOSIS — G4733 Obstructive sleep apnea (adult) (pediatric): Secondary | ICD-10-CM | POA: Diagnosis not present

## 2022-04-01 DIAGNOSIS — E669 Obesity, unspecified: Secondary | ICD-10-CM | POA: Diagnosis not present

## 2022-04-07 ENCOUNTER — Encounter (HOSPITAL_BASED_OUTPATIENT_CLINIC_OR_DEPARTMENT_OTHER): Payer: Self-pay

## 2022-04-07 ENCOUNTER — Ambulatory Visit (HOSPITAL_BASED_OUTPATIENT_CLINIC_OR_DEPARTMENT_OTHER)
Admission: RE | Admit: 2022-04-07 | Discharge: 2022-04-07 | Disposition: A | Discharge status: Home / Self Care | Payer: Medicare HMO | Source: Ambulatory Visit | Attending: Family Medicine | Admitting: Family Medicine

## 2022-04-07 DIAGNOSIS — Z1231 Encounter for screening mammogram for malignant neoplasm of breast: Secondary | ICD-10-CM | POA: Insufficient documentation

## 2022-04-17 DIAGNOSIS — S0501XA Injury of conjunctiva and corneal abrasion without foreign body, right eye, initial encounter: Secondary | ICD-10-CM | POA: Diagnosis not present

## 2022-04-19 ENCOUNTER — Other Ambulatory Visit: Payer: Self-pay | Admitting: Family Medicine

## 2022-04-21 DIAGNOSIS — G4733 Obstructive sleep apnea (adult) (pediatric): Secondary | ICD-10-CM | POA: Diagnosis not present

## 2022-05-07 ENCOUNTER — Telehealth (INDEPENDENT_AMBULATORY_CARE_PROVIDER_SITE_OTHER): Payer: Medicare HMO | Admitting: Family Medicine

## 2022-05-07 ENCOUNTER — Encounter: Payer: Self-pay | Admitting: Family Medicine

## 2022-05-07 VITALS — Ht 61.0 in | Wt 195.0 lb

## 2022-05-07 DIAGNOSIS — Z0000 Encounter for general adult medical examination without abnormal findings: Secondary | ICD-10-CM | POA: Diagnosis not present

## 2022-05-07 NOTE — Patient Instructions (Addendum)
I really enjoyed getting to talk with you today! I am available on Tuesdays and Thursdays for virtual visits if you have any questions or concerns, or if I can be of any further assistance.   CHECKLIST FROM ANNUAL WELLNESS VISIT:  -Follow up (please call to schedule if not scheduled after visit):  -Inperson visit with your Primary Doctor office: usual follow up -yearly for annual wellness visit with primary care office  Here is a list of your preventive care/health maintenance measures and the plan for each if any are due:  Health Maintenance  Topic Date Due   COVID-19 Vaccine (6 - 2023-24 season) Got covid booster in september   DTaP/Tdap/Td (2 - Td or Tdap) 11/29/2022   Medicare Annual Wellness (AWV)  05/08/2023   MAMMOGRAM  04/07/2024   COLONOSCOPY (Pts 45-41yr Insurance coverage will need to be confirmed)  12/26/2024   Pneumonia Vaccine 71 Years old  Completed   INFLUENZA VACCINE  Completed   DEXA SCAN  Completed   Hepatitis C Screening  Completed   Zoster Vaccines- Shingrix  Completed   HPV VACCINES  Aged Out    -See a dentist at least yearly   -Other issues addressed today:  -consider the safer alcohol limits of no more than 1 5oz glass of wine per 24 hour period of time  -I have included below further information regarding a healthy whole foods based diet, physical activity guidelines for adults, stress management and opportunities for social connections. I hope you find this information useful.     FOOD - THE FUEL FOR A HAPPY HEALTHY LIFE: -eat real food: lots of colorful vegetables (half the plate) -5-7 servings of vegetables and fruits per day (fresh or steamed is best), exp. 2 servings of vegetables with lunch and dinner  and 2 servings of fruit per day. Berries and greens such as kale and collards are great choices.  -consume on a regular basis: whole grains (make sure first ingredient on label contains the word "whole"), fresh fruits, fish, nuts, seeds, healthy oils (such as olive oil, avocado oil, grape seed oil) -may eat small amounts of dairy and lean meat on occasion, but avoid processed meats such as ham, bacon, lunch meat, etc. -drink water -try to avoid fast food and pre-packaged foods, processed meat -try to avoid foods that contain any ingredients with names you do not recognize  -try to avoid sugar/sweets (except for the natural sugar that occurs in fresh fruit) -try to avoid sweet drinks -try to avoid white rice, white bread, pasta (unless whole grain), white or yellow potatoes  MOVE - the key to keeping your body moving and working best: -if you wish to increase your physical activity, do so gradually and with the approval of your doctor -STOP and seek medical care immediately if you have any chest pain, chest discomfort or trouble breathing when starting or increasing exercise  -move and stretch your body, legs, feet and arms when sitting for long periods -Physical activity guidelines for optimal health in adults: -least 150 minutes per week of aerobic exercise (can talk, but not sing) once approved by your doctor, 20-30 minutes of sustained activity or two 10 minute episodes of sustained activity every day.  -resistance training at least 2 days per week if approved by your doctor -balance exercises 3+ days per week:   Stand somewhere where you have something sturdy to hold onto if you lose balance.    1) lift up on toes, start with 5x per day  and work up to 20x   2) stand and lift on leg straight out to the side so that foot is a few inches of the floor, start with 5x each side and work up to 20x each side   3) stand on one foot, start with 5 seconds each side and work up to 20 seconds on each  side  If you need ideas or help with getting more active:  -Silver sneakers https://tools.silversneakers.com  -Walk with a Doc: http://stephens-thompson.biz/  -try to include resistance (weight lifting/strength building) and balance exercises twice per week: or the following link for ideas: ChessContest.fr  UpdateClothing.com.cy  STRESS MANAGEMENT - so important for health and well being -try meditating, or just sitting quietly with deep breathing while intentionally relaxing all parts of your body for 5 minutes daily  SOCIAL CONNECTIONS: -options in [place] if you wish to engage in more social and exercise related activities:  -Silver sneakers https://tools.silversneakers.com  -Walk with a Doc: http://stephens-thompson.biz/  -Check out the [place] 50+ section on the [place] of [employer] (hiking clubs, book clubs, cards and games, chess, exercise classes, aquatic classes and much more) - see the website for details: https://www.[place].gov/departments/parks-recreation/active-adults50  -YouTube has lots of exercise videos for different ages and abilities as well  -[place] (a variety of indoor and outdoor inperson activities for adults). [phone]. [address].  -Virtual Online Classes (a variety of topics): see seniorplanet.org or call [phone]  -volunteering opportunities schools, hospice center, church, senior center or elsewhere

## 2022-05-07 NOTE — Progress Notes (Signed)
PATIENT CHECK-IN and HEALTH RISK ASSESSMENT QUESTIONNAIRE:  -completed by phone/video for upcoming Medicare Preventive Visit  Pre-Visit Check-in: 1)Vitals (height, wt, BP, etc) - record in vitals section for visit on day of visit 2)Review and Update Medications, Allergies PMH, Surgeries, Social history in Epic 3)Hospitalizations in the last year with date/reason? no 4)Review and Update Care Team (patient's specialists) in Epic 5) Complete PHQ9 in Epic  6) Complete Fall Screening in Epic 7)Review all Health Maintenance Due and order under PCP if not done.  8)Medicare Wellness Questionnaire: Answer theses question about your habits: Do you drink alcohol? Yes  If yes, how many drinks do you have a day? 1-3 glass of wine - 3-4 times per week. Have you ever smoked?yes Quit date if applicable? 1984  How many packs a day do/did you smoke? 1 pack Do you use smokeless tobacco?no Do you use an illicit drugs?no Do you exercises? Yes IF so, what type and how many days/minutes per week?30 minutes every day Are you sexually active? No Number of partners? Typical breakfast- eggs toast Typical lunch - no lunch Typical dinner - short ribs she likes to cook Typical snacks: cheetos  Beverages: water and coffee  Answer theses question about you: Can you perform most household chores?yes Do you find it hard to follow a conversation in a noisy room?no Do you often ask people to speak up or repeat themselves?no Do you feel that you have a problem with memory?no Do you balance your checkbook and or bank acounts?yes Do you feel safe at home?yes Last dentist visit? Has an appointment Monday Do you need assistance with any of the following: Please note if so no  Driving?  Feeding yourself?  Getting from bed to chair?  Getting to the toilet?  Bathing or showering?  Dressing yourself?  Managing money?  Climbing a flight of stairs  Preparing meals?  Do you have Advanced Directives in place (Living  Will, Healthcare Power or Attorney)? yes   Last eye Exam and location? [facility] appointment next month   Do you currently use prescribed or non-prescribed narcotic or opioid pain medications? no  Do you have a history or close family history of breast, ovarian, tubal or peritoneal cancer or a family member with BRCA (breast cancer susceptibility 1 and 2) gene mutations? Mother had cancer Nurse/Assistant Credentials/time stamp:  Westley Hummer CMA ----------------------------------------------------------------------------------------------------------------------------------------------------------------------------------------------------------------------   [place] VISIT WITH PROVIDER: (Welcome to Medicare, initial annual wellness or annual wellness exam)  Virtual Visit via Video Note  I connected with [name]  on 05/07/2022 by a video enabled telemedicine application and verified that I am speaking with the correct person using two identifiers.  Location patient: home Location provider:work or home office Persons participating in the virtual visit: patient, provider  Concerns and/or follow up today: none    See HM section in Epic for other details of completed HM.    ROS: denies any concerns other than her weight. She plans to discuss with PCP as is interested in weight loss medications. See diet discussion above. She likes fruit, she likes some vegetables but doe snot consume them as often. Does like to cook/bake at home. She reports she is set in her ways when it comes to diet habits. She was able to quit cigarettes cold [place] remotely.   Patient-completed extensive health risk assessment - reviewed and discussed with the patient: See Health Risk Assessment completed with patient prior to the visit either above or in recent phone note.  This was reviewed in detailed with the patient today and appropriate recommendations, orders and referrals were  placed as needed per Summary below and patient instructions.   Review of Medical History: -PMH, PSH, Family History and current specialty and care providers reviewed and updated and listed below   Patient Care Team: Mosie Lukes, MD as PCP - General (Family Medicine) Rolm Bookbinder, MD as Consulting Physician (Dermatology) Emily Filbert, MD as Consulting Physician (Obstetrics and Gynecology) Hale Bogus., MD as Consulting Physician (Gastroenterology)   Past Medical History:  Diagnosis Date   Arthritis    Benign paroxysmal positional vertigo 11/14/2013   Colon polyp    Depression    Diverticulosis    Essential hypertension, benign    GERD (gastroesophageal reflux disease) 09/02/2013   GI bleed    Glaucoma    Glucose intolerance (impaired glucose tolerance)    H/O tobacco use, presenting hazards to health 09/02/2013   History  of basal cell carcinoma    History of cardiac arrhythmia    Hyperlipidemia    Hypertension    Medicare welcome visit 11/28/2012   Obesity 02/08/2017   Palpitations 02/04/2017   Personal history of colonic polyps 09/02/2013   Patient reports H/O 2 colonoscopies, last one dates back to 2012 in [place] She reports they were labeled as incomplete.  Reports 1st colonoscopy had 1 benign polyp and the 2nd was clear.    Renal insufficiency 04/28/2021   Sleep apnea 10/01/2016   Not on CPAP machine    Stroke [facility])    UTI (urinary tract infection)     Past Surgical History:  Procedure Laterality Date   CAROTID STENT     CERVICAL CONE BIOPSY  1960s   COLONOSCOPY  2016   DILATION AND CURETTAGE OF UTERUS  1966   KNEE SURGERY Left 11/01/2008   left knee total replacement Dr Lajean Saver TOOTH EXTRACTION      Social History   Socioeconomic History   Marital status: Divorced    Spouse name: Not on file   Number of children: 0   Years of education: Not on file   Highest education level: Not on file  Occupational History   Occupation: Retired   Tobacco Use    Smoking status: Former    Packs/day: 1.00    Years: 15.00    Total pack years: 15.00    Types: Cigarettes    Quit date: 06/08/1982    Years since quitting: 39.9   Smokeless tobacco: Never  Vaping Use   Vaping Use: Never used  Substance and Sexual Activity   Alcohol use: Yes    Comment: 1-2 glasses of wine per day   Drug use: No   Sexual activity: Not Currently    Birth control/protection: Post-menopausal    Comment: lives alone, no dietary restrictions except no lactose,  Other Topics Concern   Not on file  Social History Narrative   Occupation: unemployed Product manager- now retired   Divorced   no children    [place] from [place] alone with 4 cats   Enjoys gardening, planting.             Social Determinants of Health   Financial Resource Strain: Low Risk  (04/28/2021)   Overall Financial Resource Strain (CARDIA)    Difficulty of Paying Living Expenses: Not hard at all  Food Insecurity: No Food Insecurity (04/28/2021)   Hunger Vital Sign    Worried About Running Out of Food  in the Last Year: Never true    [place] in the Last Year: Never true  Transportation Needs: No Transportation Needs (04/28/2021)   PRAPARE - Hydrologist (Medical): No    Lack of Transportation (Non-Medical): No  Physical Activity: Inactive (04/28/2021)   Exercise Vital Sign    Days of Exercise per Week: 0 days    Minutes of Exercise per Session: 0 min  Stress: No Stress Concern Present (04/28/2021)   [place]    Feeling of Stress : Not at all  Social Connections: Socially Isolated (04/28/2021)   Social Connection and Isolation Panel [NHANES]    Frequency of Communication with Friends and Family: More than three times a week    Frequency of Social Gatherings with Friends and Family: More than three times a week    Attends Religious Services: Never    Marine scientist or  Organizations: No    Attends Archivist Meetings: Never    Marital Status: Divorced  Human resources officer Violence: Not At Risk (04/28/2021)   Humiliation, Afraid, Rape, and Kick questionnaire    Fear of Current or Ex-Partner: No    Emotionally Abused: No    Physically Abused: No    Sexually Abused: No    Family History  Problem Relation Age of Onset   Coronary artery disease Paternal Grandmother    Hyperlipidemia Paternal Grandmother    Hypertension Paternal Grandmother    Stroke Paternal Grandmother    Hypertension Mother    Lung cancer Mother        smoker   Uterine cancer Mother    Birth defects Mother        lung cancer in smoker   COPD Mother    Hypertension Father    Kidney disease Father        Renal Cell Carcinoma   Birth defects Father        renal cell CA, smoker   Hypertension Maternal Grandmother    Hypertension Maternal Grandfather    Hypertension Paternal Grandfather    Dementia Paternal Grandfather    Cancer Brother        testicular   Heart disease Brother        NI, smoker   Hyperlipidemia Other    Colon cancer Neg Hx    Esophageal cancer Neg Hx     Current Outpatient Medications on File Prior to Visit  Medication Sig Dispense Refill   aspirin 81 MG tablet Take 81 mg by mouth daily.     COVID-19 mRNA bivalent vaccine, Pfizer, (PFIZER COVID-19 VAC BIVALENT) injection Inject into the muscle. 0.3 mL 0   fish oil-omega-3 fatty acids 1000 MG capsule Take 1 g by mouth daily.      hydrochlorothiazide (MICROZIDE) 12.5 MG capsule Take 1 capsule (12.5 mg total) by mouth daily. 90 capsule 0   hyoscyamine (ANASPAZ) 0.125 MG TBDP disintergrating tablet Place 1 tablet (0.125 mg total) under the tongue every 4 (four) hours as needed. 30 tablet 1   lovastatin (MEVACOR) 40 MG tablet Take 1 tablet (40 mg total) by mouth at bedtime. 90 tablet 3   metoprolol tartrate (LOPRESSOR) 100 MG tablet Take 1 tablet (100 mg total) by mouth 2 (two) times daily. 180 tablet 3    Multiple Vitamins-Minerals (MULTIVITAMIN WOMEN PO) Take 1 tablet by mouth daily.     Oxymetazoline HCl (NASAL SPRAY) 0.05 % SOLN Place into the nose.  tretinoin (RETIN-A) 0.1 % cream APPLY TO AFFECTED AREA(S) ONE TIME DAILY AT BEDTIME USING PEA SIZED AMOUNT TO FACE 45 g 5   No current facility-administered medications on file prior to visit.    No Known Allergies     Physical Exam There were no vitals filed for this visit. Estimated body mass index is 36.84 kg/m as calculated from the following:   Height as of this encounter: 5' 1" (1.549 m).   Weight as of this encounter: 195 lb (88.5 kg).  EKG (optional): deferred due to virtual visit  GENERAL: alert, oriented, appears well and in no acute distress; visual acuity grossly intact, full vision exam deferred due to pandemic and/or virtual encounter  HEENT: atraumatic, conjunttiva clear, no obvious abnormalities on inspection of external nose and ears  NECK: normal movements of the head and neck  LUNGS: on inspection no signs of respiratory distress, breathing rate appears normal, no obvious gross SOB, gasping or wheezing  CV: no obvious cyanosis  MS: moves all visible extremities without noticeable abnormality  PSYCH/NEURO: pleasant and cooperative, no obvious depression or anxiety, speech and thought processing grossly intact, Cognitive function grossly intact  Flowsheet Row Video Visit from 05/07/2022 in [facility]  PHQ-9 Total Score 0           05/07/2022    4:16 PM 04/28/2021   10:28 AM 09/19/2020    2:22 PM 02/23/2019   10:12 AM 02/21/2018   11:20 AM  Depression screen PHQ 2/9  Decreased Interest 0 0 0 0 0  Down, Depressed, Hopeless 0 0 0 0 0  PHQ - 2 Score 0 0 0 0 0  Altered sleeping 0  3    Tired, decreased energy 0  0    Change in appetite 0  0    Feeling bad or failure about yourself  0  0    Trouble concentrating 0  0    Moving slowly or fidgety/restless 0  0    Suicidal  thoughts 0  0    PHQ-9 Score 0  3    Difficult doing work/chores Not difficult at all           02/23/2019   10:12 AM 10/06/2019    9:57 AM 09/19/2020    2:22 PM 04/28/2021   10:26 AM 05/07/2022    4:15 PM  Fall Risk  Falls in the past year? 0  1 0 0  Was there an injury with Fall?   0 0 0  Fall Risk Category Calculator   2 0 0  Fall Risk Category   Moderate Low Low  Patient Fall Risk Level  Low fall risk  Low fall risk Low fall risk  Patient at Risk for Falls Due to     No Fall Risks  Fall risk Follow up    Falls prevention discussed Falls evaluation completed     SUMMARY AND PLAN:  Medicare annual wellness visit, subsequent   Discussed applicable health maintenance/preventive health measures and advised and referred or ordered per patient preferences:  Health Maintenance  Topic Date Due   COVID-19 Vaccine (6 - 2023-24 season) Got updated booster in sept. Updated in system. She had covid in the past and was hospitalized and suffered kidney injury.    DTaP/Tdap/Td (2 - Td or Tdap) 11/29/2022   Medicare Annual Wellness (AWV)  05/08/2023   MAMMOGRAM  04/07/2024   COLONOSCOPY (Pts 45-54yr Insurance coverage will need to be confirmed)  12/26/2024  Pneumonia Vaccine 54+ Years old  Completed   INFLUENZA VACCINE  Completed   DEXA SCAN  Completed   Hepatitis C Screening  Completed   Zoster Vaccines- Shingrix  Completed   HPV VACCINES  Aged Out    Education and counseling on the following was provided based on the above review of health and a plan/checklist for the patient, along with additional information discussed, was provided for the patient in the patient instructions :   -Advised and counseled on a whole foods based healthy diet, discussed risk of unhealthy diet and provided details for a hormone balanced way of eating/diet to aid in weight reduction below. She stated she is interested in getting healthier and in reducing her weight; she seems to be in a precontemplation  stage in terms of diet changes. She reports having an appointment scheduled for a discussion about weight loss medication with Dr. Randel Pigg.  A summary of a healthy diet that would aid in balancing metabolism and results in weight reduction was provided in the Patient Instructions w/ her permission in case she finds anything helpful. If she is every contemplating using diet changes she would benefit from nutrition services. Recommended regular exercise and provided information below. -Advised yearly dental visits at minimum and regular eye exams -Advised and counseled  on safer drinking levels and risks.   Follow up: see patient instructions   Patient Instructions  I really enjoyed getting to talk with you today! I am available on Tuesdays and Thursdays for virtual visits if you have any questions or concerns, or if I can be of any further assistance.   CHECKLIST FROM ANNUAL WELLNESS VISIT:  -Follow up (please call to schedule if not scheduled after visit):  -Inperson visit with your Primary Doctor office: usual follow up -yearly for annual wellness visit with primary care office  Here is a list of your preventive care/health maintenance measures and the plan for each if any are due:  Health Maintenance  Topic Date Due   COVID-19 Vaccine (6 - 2023-24 season) Got covid booster in september   DTaP/Tdap/Td (2 - Td or Tdap) 11/29/2022   Medicare Annual Wellness (AWV)  05/08/2023   MAMMOGRAM  04/07/2024   COLONOSCOPY (Pts 45-1yr Insurance coverage will need to be confirmed)  12/26/2024   Pneumonia Vaccine 71 Years old  Completed   INFLUENZA VACCINE  Completed   DEXA SCAN  Completed   Hepatitis C Screening  Completed   Zoster Vaccines- Shingrix  Completed   HPV VACCINES  Aged Out    -See a dentist at least yearly   -Other issues addressed today:  -consider the safer alcohol limits of no more than 1 5oz glass of wine per 24 hour period of time  -I have included below further  information regarding a healthy whole foods based diet, physical activity guidelines for adults, stress management and opportunities for social connections. I hope you find this information useful.   -----------------------------------------------------------------------------------------------------------------------------------------------------------------------------------------------------------------------------------------------------------  FOOD - THE FUEL FOR A HAPPY HEALTHY LIFE: -eat real food: lots of colorful vegetables (half the plate) -5-7 servings of vegetables and fruits per day (fresh or steamed is best), exp. 2 servings of vegetables with lunch and dinner and 2 servings of fruit per day. Berries and greens such as kale and collards are great choices.  -consume on a regular basis: whole grains (make sure first ingredient on label contains the word "whole"), fresh fruits, fish, nuts, seeds, healthy oils (such as olive oil, avocado oil, grape seed oil) -  may eat small amounts of dairy and lean meat on occasion, but avoid processed meats such as ham, bacon, lunch meat, etc. -drink water -try to avoid fast food and pre-packaged foods, processed meat -try to avoid foods that contain any ingredients with names you do not recognize  -try to avoid sugar/sweets (except for the natural sugar that occurs in fresh fruit) -try to avoid sweet drinks -try to avoid white rice, white bread, pasta (unless whole grain), white or yellow potatoes  MOVE - the key to keeping your body moving and working best: -if you wish to increase your physical activity, do so gradually and with the approval of your doctor -STOP and seek medical care immediately if you have any chest pain, chest discomfort or trouble breathing when starting or increasing exercise  -move and stretch your body, legs, feet and arms when sitting for long periods -Physical activity guidelines for optimal health in adults: -least 150  minutes per week of aerobic exercise (can talk, but not sing) once approved by your doctor, 20-30 minutes of sustained activity or two 10 minute episodes of sustained activity every day.  -resistance training at least 2 days per week if approved by your doctor -balance exercises 3+ days per week:   Stand somewhere where you have something sturdy to hold onto if you lose balance.    1) lift up on toes, start with 5x per day and work up to 20x   2) stand and lift on leg straight out to the side so that foot is a few inches of the floor, start with 5x each side and work up to 20x each side   3) stand on one foot, start with 5 seconds each side and work up to 20 seconds on each side  If you need ideas or help with getting more active:  -Silver sneakers https://tools.silversneakers.com  -Walk with a Doc: http://stephens-thompson.biz/  -try to include resistance (weight lifting/strength building) and balance exercises twice per week: or the following link for ideas: ChessContest.fr  UpdateClothing.com.cy  STRESS MANAGEMENT - so important for health and well being -try meditating, or just sitting quietly with deep breathing while intentionally relaxing all parts of your body for 5 minutes daily  SOCIAL CONNECTIONS: -options in [place] if you wish to engage in more social and exercise related activities:  -Silver sneakers https://tools.silversneakers.com  -Walk with a Doc: http://stephens-thompson.biz/  -Check out the [place] 50+ section on the [place] [employer] (hiking clubs, book clubs, cards and games, chess, exercise classes, aquatic classes and much more) - see the website for details: https://www.[place].gov/departments/parks-recreation/active-adults50  -YouTube has lots of exercise videos for different ages and abilities as well  -[place] (a  variety of indoor and outdoor inperson activities for adults). ([phone]) 119-4306. [address].  -Virtual Online Classes (a variety of topics): see seniorplanet.org or call 810-741-2551  -volunteering opportunities schools, hospice center, church, senior center or elsewhere              Lucretia Kern, DO

## 2022-05-28 DIAGNOSIS — H5203 Hypermetropia, bilateral: Secondary | ICD-10-CM | POA: Diagnosis not present

## 2022-06-03 NOTE — Assessment & Plan Note (Signed)
Encouraged DASH or MIND diet, decrease po intake and increase exercise as tolerated. Needs 7-8 hours of sleep nightly. Avoid trans fats, eat small, frequent meals every 4-5 hours with lean proteins, complex carbs and healthy fats. Minimize simple carbs, high fat foods and processed foods 

## 2022-06-03 NOTE — Assessment & Plan Note (Signed)
Monitor and report any concerns, no changes to meds. Encouraged heart healthy diet such as the DASH diet and exercise as tolerated.  ?

## 2022-06-03 NOTE — Assessment & Plan Note (Signed)
hgba1c acceptable, minimize simple carbs. Increase exercise as tolerated.  

## 2022-06-03 NOTE — Assessment & Plan Note (Signed)
Hydrate and monitor 

## 2022-06-03 NOTE — Assessment & Plan Note (Signed)
Encourage heart healthy diet such as MIND or DASH diet, increase exercise, avoid trans fats, simple carbohydrates and processed foods, consider a krill or fish or flaxseed oil cap daily.  °

## 2022-06-03 NOTE — Assessment & Plan Note (Signed)
Patient encouraged to maintain heart healthy diet, regular exercise, adequate sleep. Consider daily probiotics. Take medications as prescribed. Labs ordered and reviewed. Colonoscopy done in 2021 repeat in 2026. Dexa scan 2022 repeat in 2-3  years. MGM 03/2022 repeat in next 1-2 years.

## 2022-06-04 ENCOUNTER — Ambulatory Visit (INDEPENDENT_AMBULATORY_CARE_PROVIDER_SITE_OTHER): Payer: Medicare HMO | Admitting: Family Medicine

## 2022-06-04 VITALS — BP 126/80 | HR 63 | Temp 97.6°F | Resp 18 | Ht 61.0 in | Wt 199.6 lb

## 2022-06-04 DIAGNOSIS — I10 Essential (primary) hypertension: Secondary | ICD-10-CM | POA: Diagnosis not present

## 2022-06-04 DIAGNOSIS — E739 Lactose intolerance, unspecified: Secondary | ICD-10-CM

## 2022-06-04 DIAGNOSIS — N289 Disorder of kidney and ureter, unspecified: Secondary | ICD-10-CM | POA: Diagnosis not present

## 2022-06-04 DIAGNOSIS — Z0000 Encounter for general adult medical examination without abnormal findings: Secondary | ICD-10-CM

## 2022-06-04 DIAGNOSIS — R002 Palpitations: Secondary | ICD-10-CM

## 2022-06-04 DIAGNOSIS — E669 Obesity, unspecified: Secondary | ICD-10-CM | POA: Diagnosis not present

## 2022-06-04 DIAGNOSIS — R350 Frequency of micturition: Secondary | ICD-10-CM

## 2022-06-04 DIAGNOSIS — E785 Hyperlipidemia, unspecified: Secondary | ICD-10-CM | POA: Diagnosis not present

## 2022-06-04 LAB — COMPREHENSIVE METABOLIC PANEL
ALT: 14 U/L (ref 0–35)
AST: 18 U/L (ref 0–37)
Albumin: 4.3 g/dL (ref 3.5–5.2)
Alkaline Phosphatase: 43 U/L (ref 39–117)
BUN: 32 mg/dL — ABNORMAL HIGH (ref 6–23)
CO2: 30 mEq/L (ref 19–32)
Calcium: 9.7 mg/dL (ref 8.4–10.5)
Chloride: 101 mEq/L (ref 96–112)
Creatinine, Ser: 1.4 mg/dL — ABNORMAL HIGH (ref 0.40–1.20)
GFR: 37.2 mL/min — ABNORMAL LOW (ref >60.00)
Glucose, Bld: 101 mg/dL — ABNORMAL HIGH (ref 70–99)
Potassium: 4.4 mEq/L (ref 3.5–5.1)
Sodium: 139 mEq/L (ref 135–145)
Total Bilirubin: 0.4 mg/dL (ref 0.2–1.2)
Total Protein: 7 g/dL (ref 6.0–8.3)

## 2022-06-04 LAB — URINALYSIS, ROUTINE W REFLEX MICROSCOPIC
Bilirubin Urine: NEGATIVE
Hgb urine dipstick: NEGATIVE
Ketones, ur: NEGATIVE
Leukocytes,Ua: NEGATIVE
Nitrite: NEGATIVE
Specific Gravity, Urine: 1.02 (ref 1.000–1.030)
Total Protein, Urine: NEGATIVE
Urine Glucose: NEGATIVE
Urobilinogen, UA: 0.2 (ref 0.0–1.0)
pH: 6.5 (ref 5.0–8.0)

## 2022-06-04 LAB — LIPID PANEL
Cholesterol: 182 mg/dL (ref 0–200)
HDL: 51.7 mg/dL (ref >39.00)
NonHDL: 130.48
Total CHOL/HDL Ratio: 4
Triglycerides: 207 mg/dL — ABNORMAL HIGH (ref 0.0–149.0)
VLDL: 41.4 mg/dL — ABNORMAL HIGH (ref 0.0–40.0)

## 2022-06-04 LAB — CBC WITH DIFFERENTIAL/PLATELET
Basophils Absolute: 0 10*3/uL (ref 0.0–0.1)
Basophils Relative: 0.5 % (ref 0.0–3.0)
Eosinophils Absolute: 0.1 10*3/uL (ref 0.0–0.7)
Eosinophils Relative: 1.8 % (ref 0.0–5.0)
HCT: 36 % (ref 36.0–46.0)
Hemoglobin: 12 g/dL (ref 12.0–15.0)
Lymphocytes Relative: 40.3 % (ref 12.0–46.0)
Lymphs Abs: 2.7 10*3/uL (ref 0.7–4.0)
MCHC: 33.2 g/dL (ref 30.0–36.0)
MCV: 96.2 fl (ref 78.0–100.0)
Monocytes Absolute: 0.5 10*3/uL (ref 0.1–1.0)
Monocytes Relative: 7.3 % (ref 3.0–12.0)
Neutro Abs: 3.4 10*3/uL (ref 1.4–7.7)
Neutrophils Relative %: 50.1 % (ref 43.0–77.0)
Platelets: 259 10*3/uL (ref 150.0–400.0)
RBC: 3.75 Mil/uL — ABNORMAL LOW (ref 3.87–5.11)
RDW: 13.2 % (ref 11.5–15.5)
WBC: 6.7 10*3/uL (ref 4.0–10.5)

## 2022-06-04 LAB — MICROALBUMIN / CREATININE URINE RATIO
Creatinine,U: 94.2 mg/dL
Microalb Creat Ratio: 0.7 mg/g (ref 0.0–30.0)
Microalb, Ur: <0.7 mg/dL (ref 0.0–1.9)

## 2022-06-04 LAB — HEMOGLOBIN A1C: Hgb A1c MFr Bld: 5.7 % (ref 4.6–6.5)

## 2022-06-04 LAB — LDL CHOLESTEROL, DIRECT: Direct LDL: 97 mg/dL

## 2022-06-04 NOTE — Patient Instructions (Addendum)
RSV, Respiratory Syncitial Virus, [facility] at [place] 65 Years and Older, Female Preventive care refers to lifestyle choices and visits with your health care provider that can promote health and wellness. Preventive care visits are also called wellness exams. What can I expect for my preventive care visit? Counseling Your health care provider may ask you questions about your: Medical history, including: Past medical problems. Family medical history. Pregnancy and menstrual history. History of falls. Current health, including: Memory and ability to understand (cognition). Emotional well-being. Home life and relationship well-being. Sexual activity and sexual health. Lifestyle, including: Alcohol, nicotine or tobacco, and drug use. Access to firearms. Diet, exercise, and sleep habits. Work and work Statistician. Sunscreen use. Safety issues such as seatbelt and bike helmet use. Physical exam Your health care provider will check your: Height and weight. These may be used to calculate your BMI (body mass index). BMI is a measurement that tells if you are at a healthy weight. Waist circumference. This measures the distance around your waistline. This measurement also tells if you are at a healthy weight and may help predict your risk of certain diseases, such as type 2 diabetes and high blood pressure. Heart rate and blood pressure. Body temperature. Skin for abnormal spots. What immunizations do I need?  Vaccines are usually given at various ages, according to a schedule. Your health care provider will recommend vaccines for you based on your age, medical history, and lifestyle or other factors, such as travel or where you work. What tests do I need? Screening Your health care provider may recommend screening tests for certain conditions. This may include: Lipid and cholesterol levels. Hepatitis C test. Hepatitis B test. HIV (human immunodeficiency virus)  test. STI (sexually transmitted infection) testing, if you are at risk. Lung cancer screening. Colorectal cancer screening. Diabetes screening. This is done by checking your blood sugar (glucose) after you have not eaten for a while (fasting). Mammogram. Talk with your health care provider about how often you should have regular mammograms. BRCA-related cancer screening. This may be done if you have a family history of breast, ovarian, tubal, or peritoneal cancers. Bone density scan. This is done to screen for osteoporosis. Talk with your health care provider about your test results, treatment options, and if necessary, the need for more tests. Follow these instructions at home: Eating and drinking  Eat a diet that includes fresh fruits and vegetables, whole grains, lean protein, and low-fat dairy products. Limit your intake of foods with high amounts of sugar, saturated fats, and salt. Take vitamin and mineral supplements as recommended by your health care provider. Do not drink alcohol if your health care provider tells you not to drink. If you drink alcohol: Limit how much you have to 0-1 drink a day. Know how much alcohol is in your drink. In the [place] one drink equals one 12 oz bottle of beer (355 mL), one 5 oz glass of wine (148 mL), or one 1 oz glass of hard liquor (44 mL). Lifestyle Brush your teeth every morning and night with fluoride toothpaste. Floss one time each day. Exercise for at least 30 minutes 5 or more days each week. Do not use any products that contain nicotine or tobacco. These products include cigarettes, chewing tobacco, and vaping devices, such as e-cigarettes. If you need help quitting, ask your health care provider. Do not use drugs. If you are sexually active, practice safe sex. Use a condom or other form of protection in order  to prevent STIs. Take aspirin only as told by your health care provider. Make sure that you understand how much to take and what form to  take. Work with your health care provider to find out whether it is safe and beneficial for you to take aspirin daily. Ask your health care provider if you need to take a cholesterol-lowering medicine (statin). Find healthy ways to manage stress, such as: Meditation, yoga, or listening to music. Journaling. Talking to a trusted person. Spending time with friends and family. Minimize exposure to UV radiation to reduce your risk of skin cancer. Safety Always wear your seat belt while driving or riding in a vehicle. Do not drive: If you have been drinking alcohol. Do not ride with someone who has been drinking. When you are tired or distracted. While texting. If you have been using any mind-altering substances or drugs. Wear a helmet and other protective equipment during sports activities. If you have firearms in your house, make sure you follow all gun safety procedures. What's next? Visit your health care provider once a year for an annual wellness visit. Ask your health care provider how often you should have your eyes and teeth checked. Stay up to date on all vaccines. This information is not intended to replace advice given to you by your health care provider. Make sure you discuss any questions you have with your health care provider. Document Revised: 11/20/2020 Document Reviewed: 11/20/2020 Elsevier Patient Education  [place].

## 2022-06-04 NOTE — Progress Notes (Signed)
Subjective:   By signing my name below, I, Kellie Simmering, attest that this documentation has been prepared under the direction and in the presence of Mosie Lukes, MD., 06/04/2022.   Patient ID: [name], female    DOB: 1951/05/15, 71 y.o.   MRN: [id number]  Chief Complaint  Patient presents with   Annual Exam    Pt states fasting    HPI Patient is in today for a comprehensive physical exam and follow up on chronic medical concerns. She denies CP/SOB/HA/ congestion/fevers/GI or GU c/o.  Family History No changes to the family history.  Left Leg Swelling She reports that in 04/2023 her left leg swelled beneath the knee and was sore, but this resolved the next day. This has not reoccurred.   Ventricular Tachycardia Patient reports that on 05/14/2022, she awoke at around midnight with a racing heartbeat that lasted for 30 minutes. Recorded heart rates were 141,148,142, and 81. She is interested in following through with this with a cardiologist. She has a CPAP but has not recently been using this.  Weight Loss She is interested in taking weight loss injectable medications such as [place]. She has an appointment next month with [facility]. Body mass index is 37.71 kg/m.  Wt Readings from Last 3 Encounters:  06/04/22 199 lb 9.6 oz (90.5 kg)  05/07/22 195 lb (88.5 kg)  08/08/21 189 lb (85.7 kg)   Lab Results  Component Value Date   HGBA1C 5.7 06/04/2022   Past Medical History:  Diagnosis Date   Arthritis    Benign paroxysmal positional vertigo 11/14/2013   Colon polyp    Depression    Diverticulosis    Essential hypertension, benign    GERD (gastroesophageal reflux disease) 09/02/2013   GI bleed    Glaucoma    Glucose intolerance (impaired glucose tolerance)    H/O tobacco use, presenting hazards to health 09/02/2013   History  of basal cell carcinoma    History of cardiac arrhythmia    Hyperlipidemia    Hypertension    Medicare welcome  visit 11/28/2012   Obesity 02/08/2017   Palpitations 02/04/2017   Personal history of colonic polyps 09/02/2013   Patient reports H/O 2 colonoscopies, last one dates back to 2012 in [place] She reports they were labeled as incomplete.  Reports 1st colonoscopy had 1 benign polyp and the 2nd was clear.    Renal insufficiency 04/28/2021   Sleep apnea 10/01/2016   Not on CPAP machine    Stroke [facility])    UTI (urinary tract infection)     Past Surgical History:  Procedure Laterality Date   CAROTID STENT     CERVICAL CONE BIOPSY  1960s   COLONOSCOPY  2016   DILATION AND CURETTAGE OF UTERUS  1966   KNEE SURGERY Left 11/01/2008   left knee total replacement Dr Lajean Saver TOOTH EXTRACTION      Family History  Problem Relation Age of Onset   Coronary artery disease Paternal Grandmother    Hyperlipidemia Paternal Grandmother    Hypertension Paternal Grandmother    Stroke Paternal Grandmother    Hypertension Mother    Lung cancer Mother        smoker   Uterine cancer Mother    Birth defects Mother        lung cancer in smoker   COPD Mother    Hypertension Father    Kidney disease Father        Renal  Cell Carcinoma   Birth defects Father        renal cell CA, smoker   Hypertension Maternal Grandmother    Hypertension Maternal Grandfather    Hypertension Paternal Grandfather    Dementia Paternal Grandfather    Cancer Brother        testicular   Heart disease Brother        NI, smoker   Hyperlipidemia Other    Colon cancer Neg Hx    Esophageal cancer Neg Hx     Social History   Socioeconomic History   Marital status: Divorced    Spouse name: Not on file   Number of children: 0   Years of education: Not on file   Highest education level: Not on file  Occupational History   Occupation: Retired   Tobacco Use   Smoking status: Former    Packs/day: 1.00    Years: 15.00    Total pack years: 15.00    Types: Cigarettes    Quit date: 06/08/1982    Years since quitting: 40.0    Smokeless tobacco: Never  Vaping Use   Vaping Use: Never used  Substance and Sexual Activity   Alcohol use: Yes    Comment: 1-2 glasses of wine per day   Drug use: No   Sexual activity: Not Currently    Birth control/protection: Post-menopausal    Comment: lives alone, no dietary restrictions except no lactose,  Other Topics Concern   Not on file  Social History Narrative   Occupation: unemployed Product manager- now retired   Divorced   no children    Moved from [place] alone with 4 cats   Enjoys gardening, planting.             Social Determinants of Health   Financial Resource Strain: Low Risk  (04/28/2021)   Overall Financial Resource Strain (CARDIA)    Difficulty of Paying Living Expenses: Not hard at all  Food Insecurity: No Food Insecurity (04/28/2021)   Hunger Vital Sign    Worried About Running Out of Food in the Last Year: Never true    Ran Out of Food in the Last Year: Never true  Transportation Needs: No Transportation Needs (04/28/2021)   PRAPARE - Hydrologist (Medical): No    Lack of Transportation (Non-Medical): No  Physical Activity: Inactive (04/28/2021)   Exercise Vital Sign    Days of Exercise per Week: 0 days    Minutes of Exercise per Session: 0 min  Stress: No Stress Concern Present (04/28/2021)   [place]    Feeling of Stress : Not at all  Social Connections: Socially Isolated (04/28/2021)   Social Connection and Isolation Panel [NHANES]    Frequency of Communication with Friends and Family: More than three times a week    Frequency of Social Gatherings with Friends and Family: More than three times a week    Attends Religious Services: Never    Marine scientist or Organizations: No    Attends Archivist Meetings: Never    Marital Status: Divorced  Human resources officer Violence: Not At Risk (04/28/2021)   Humiliation,  Afraid, Rape, and Kick questionnaire    Fear of Current or Ex-Partner: No    Emotionally Abused: No    Physically Abused: No    Sexually Abused: No    Outpatient Medications Prior to Visit  Medication Sig Dispense Refill  aspirin 81 MG tablet Take 81 mg by mouth daily.     fish oil-omega-3 fatty acids 1000 MG capsule Take 1 g by mouth daily.      hydrochlorothiazide (MICROZIDE) 12.5 MG capsule Take 1 capsule (12.5 mg total) by mouth daily. 90 capsule 0   hyoscyamine (ANASPAZ) 0.125 MG TBDP disintergrating tablet Place 1 tablet (0.125 mg total) under the tongue every 4 (four) hours as needed. 30 tablet 1   lovastatin (MEVACOR) 40 MG tablet Take 1 tablet (40 mg total) by mouth at bedtime. 90 tablet 3   metoprolol tartrate (LOPRESSOR) 100 MG tablet Take 1 tablet (100 mg total) by mouth 2 (two) times daily. 180 tablet 3   Multiple Vitamins-Minerals (MULTIVITAMIN WOMEN PO) Take 1 tablet by mouth daily.     Oxymetazoline HCl (NASAL SPRAY) 0.05 % SOLN Place into the nose.     tretinoin (RETIN-A) 0.1 % cream APPLY TO AFFECTED AREA(S) ONE TIME DAILY AT BEDTIME USING PEA SIZED AMOUNT TO FACE 45 g 5   COVID-19 mRNA bivalent vaccine, Pfizer, (PFIZER COVID-19 VAC BIVALENT) injection Inject into the muscle. (Patient not taking: Reported on 06/04/2022) 0.3 mL 0   No facility-administered medications prior to visit.    No Known Allergies  Review of Systems  Constitutional:  Negative for chills and fever.  HENT:  Negative for congestion.   Respiratory:  Negative for shortness of breath.   Cardiovascular:  Negative for chest pain.  Gastrointestinal:  Negative for abdominal pain, blood in stool, constipation, diarrhea, nausea and vomiting.  Genitourinary:  Negative for dysuria, frequency, hematuria and urgency.  Skin:           Neurological:  Negative for headaches.       Objective:    Physical Exam Constitutional:      General: She is not in acute distress.    Appearance: Normal appearance.  She is obese. She is not ill-appearing.  HENT:     Head: Normocephalic and atraumatic.     Right Ear: Tympanic membrane, ear canal and external ear normal.     Left Ear: Tympanic membrane, ear canal and external ear normal.     Nose: Nose normal.     Mouth/Throat:     Mouth: Mucous membranes are moist.     Pharynx: Oropharynx is clear.  Eyes:     General:        Right eye: No discharge.        Left eye: No discharge.     Extraocular Movements: Extraocular movements intact.     Right eye: No nystagmus.     Left eye: No nystagmus.     Pupils: Pupils are equal, round, and reactive to light.  Neck:     Vascular: No carotid bruit.  Cardiovascular:     Rate and Rhythm: Normal rate and regular rhythm.     Pulses: Normal pulses.     Heart sounds: Normal heart sounds. No murmur heard.    No gallop.  Pulmonary:     Effort: Pulmonary effort is normal. No respiratory distress.     Breath sounds: Normal breath sounds. No wheezing or rales.  Abdominal:     General: Bowel sounds are normal.     Palpations: Abdomen is soft.     Tenderness: There is no abdominal tenderness. There is no guarding.  Musculoskeletal:        General: Normal range of motion.     Cervical back: Normal range of motion.     Right  lower leg: No edema.     Left lower leg: No edema.     Comments: Muscle strength 5/5 on upper and lower extremities.   Lymphadenopathy:     Cervical: No cervical adenopathy.  Skin:    General: Skin is warm and dry.  Neurological:     Mental Status: She is alert and oriented to person, place, and time.     Sensory: Sensation is intact.     Motor: Motor function is intact.     Coordination: Coordination is intact.     Deep Tendon Reflexes:     Reflex Scores:      Patellar reflexes are 2+ on the right side and 2+ on the left side. Psychiatric:        Mood and Affect: Mood normal.        Behavior: Behavior normal.        Judgment: Judgment normal.     BP 126/80 (BP Location: Left  Arm, Patient Position: Sitting, Cuff Size: Large)   Pulse 63   Temp 97.6 F (36.4 C) (Oral)   Resp 18   Ht '5\' 1"'$  (1.549 m)   Wt 199 lb 9.6 oz (90.5 kg)   SpO2 96%   BMI 37.71 kg/m  Wt Readings from Last 3 Encounters:  06/04/22 199 lb 9.6 oz (90.5 kg)  05/07/22 195 lb (88.5 kg)  08/08/21 189 lb (85.7 kg)    Diabetic Foot Exam - Simple   No data filed    Lab Results  Component Value Date   WBC 6.7 06/04/2022   HGB 12.0 06/04/2022   HCT 36.0 06/04/2022   PLT 259.0 06/04/2022   GLUCOSE 101 (H) 06/04/2022   CHOL 182 06/04/2022   TRIG 207.0 (H) 06/04/2022   HDL 51.70 06/04/2022   LDLDIRECT 97.0 06/04/2022   LDLCALC 66 04/24/2021   ALT 14 06/04/2022   AST 18 06/04/2022   NA 139 06/04/2022   K 4.4 06/04/2022   CL 101 06/04/2022   CREATININE 1.40 (H) 06/04/2022   BUN 32 (H) 06/04/2022   CO2 30 06/04/2022   TSH 2.48 06/04/2022   INR 1.1 10/06/2019   HGBA1C 5.7 06/04/2022   MICROALBUR <0.7 06/04/2022    Lab Results  Component Value Date   TSH 2.48 06/04/2022   Lab Results  Component Value Date   WBC 6.7 06/04/2022   HGB 12.0 06/04/2022   HCT 36.0 06/04/2022   MCV 96.2 06/04/2022   PLT 259.0 06/04/2022   Lab Results  Component Value Date   NA 139 06/04/2022   K 4.4 06/04/2022   CO2 30 06/04/2022   GLUCOSE 101 (H) 06/04/2022   BUN 32 (H) 06/04/2022   CREATININE 1.40 (H) 06/04/2022   BILITOT 0.4 06/04/2022   ALKPHOS 43 06/04/2022   AST 18 06/04/2022   ALT 14 06/04/2022   PROT 7.0 06/04/2022   ALBUMIN 4.3 06/04/2022   CALCIUM 9.7 06/04/2022   ANIONGAP 11 10/06/2019   GFR 37.20 (L) 06/04/2022   Lab Results  Component Value Date   CHOL 182 06/04/2022   Lab Results  Component Value Date   HDL 51.70 06/04/2022   Lab Results  Component Value Date   LDLCALC 66 04/24/2021   Lab Results  Component Value Date   TRIG 207.0 (H) 06/04/2022   Lab Results  Component Value Date   CHOLHDL 4 06/04/2022   Lab Results  Component Value Date   HGBA1C 5.7  06/04/2022      Assessment & Plan:  Colonoscopy: Last completed on 12/27/2019.  -Two 4 to 5 mm polyps in the transverse colon and in the proximal ascending colon, removed with a cold snare. Resected and retrieved. -Diverticulosis in the sigmoid colon and in the ascending colon. -Erythematous mucosa in the sigmoid colon. -The distal rectum and anal verge are normal on retroflexion view. Repeat in 5 years.  DEXA: Last completed on 05/19/2021. The BMD measured at Forearm Radius 33% is 0.816 g/cm2 with a T-score of -0.7. This patient is considered normal according to [facility]) criteria. Repeat in 2-5 years.  Mammogram: Last completed on 04/07/2022 with no mammographic evidence of malignancy. Repeat in 1-2 years.  Pap Smear: Last completed on 11/28/2012 with normal results.  Advanced Directives: Encouraged patient to completed advanced care planing documents.  Cardiology: Referral made to Dr. Curt Bears.  Healthy Lifestyle: Encouraged adequate sleep, exercise (>4000 steps daily), heart healthy diet, and hydration (60-80 oz of water daily).  Immunizations: Encouraged RSV and Tetanus if injured.  Labs: Routine blood work will be completed today.  Weight Loss: Reviewed the possibility of taking [place] for weight loss. Problem List Items Addressed This Visit     GLUCOSE INTOLERANCE    hgba1c acceptable, minimize simple carbs. Increase exercise as tolerated.       Relevant Orders   Urine Microalbumin w/creat. ratio (Completed)   HgB A1c (Completed)   Hyperlipidemia    Encourage heart healthy diet such as MIND or DASH diet, increase exercise, avoid trans fats, simple carbohydrates and processed foods, consider a krill or fish or flaxseed oil cap daily.       Relevant Orders   Lipid panel (Completed)   Essential hypertension, benign - Primary    Monitor and report any concerns, no changes to meds. Encouraged heart healthy diet such as the DASH diet and  exercise as tolerated.       Relevant Orders   CBC with Differential/Platelet (Completed)   Comprehensive metabolic panel (Completed)   TSH (Completed)   Urine Microalbumin w/creat. ratio (Completed)   Preventative health care    Patient encouraged to maintain heart healthy diet, regular exercise, adequate sleep. Consider daily probiotics. Take medications as prescribed. Labs ordered and reviewed. Colonoscopy done in 2021 repeat in 2026. Dexa scan 2022 repeat in 2-3  years. MGM 03/2022 repeat in next 1-2 years.       Urinary frequency   Relevant Orders   Urine Microalbumin w/creat. ratio (Completed)   Urinalysis, Routine w reflex microscopic (Completed)   Palpitations    Episodes have been occurring more frequently and lasting longer will refer to cardiology for further consideration      Relevant Orders   Ambulatory referral to Cardiology   Obesity    Encouraged DASH or MIND diet, decrease po intake and increase exercise as tolerated. Needs 7-8 hours of sleep nightly. Avoid trans fats, eat small, frequent meals every 4-5 hours with lean proteins, complex carbs and healthy fats. Minimize simple carbs, high fat foods and processed foods       Renal insufficiency    Hydrate and monitor       No orders of the defined types were placed in this encounter.  I, Penni Homans, MD, personally preformed the services described in this documentation.  All medical record entries made by the scribe were at my direction and in my presence.  I have reviewed the chart and discharge instructions (if applicable) and agree that the record reflects my personal performance and is accurate  and complete. 06/04/2022  I,Mohammed Iqbal,acting as a scribe for Penni Homans, MD.,have documented all relevant documentation on the behalf of Penni Homans, MD,as directed by  Penni Homans, MD while in the presence of Penni Homans, MD.  Penni Homans, MD

## 2022-06-05 LAB — TSH: TSH: 2.48 u[IU]/mL (ref 0.35–5.50)

## 2022-06-06 NOTE — Assessment & Plan Note (Signed)
Episodes have been occurring more frequently and lasting longer will refer to cardiology for further consideration

## 2022-06-12 DIAGNOSIS — M19011 Primary osteoarthritis, right shoulder: Secondary | ICD-10-CM | POA: Diagnosis not present

## 2022-06-12 DIAGNOSIS — M25511 Pain in right shoulder: Secondary | ICD-10-CM | POA: Diagnosis not present

## 2022-06-19 ENCOUNTER — Ambulatory Visit: Payer: Medicare HMO | Admitting: Family

## 2022-06-23 ENCOUNTER — Ambulatory Visit: Payer: Medicare HMO | Admitting: Family

## 2022-06-24 DIAGNOSIS — N183 Chronic kidney disease, stage 3 unspecified: Secondary | ICD-10-CM | POA: Diagnosis not present

## 2022-06-24 DIAGNOSIS — I129 Hypertensive chronic kidney disease with stage 1 through stage 4 chronic kidney disease, or unspecified chronic kidney disease: Secondary | ICD-10-CM | POA: Diagnosis not present

## 2022-06-25 ENCOUNTER — Other Ambulatory Visit: Payer: Self-pay | Admitting: Family Medicine

## 2022-06-25 DIAGNOSIS — E739 Lactose intolerance, unspecified: Secondary | ICD-10-CM

## 2022-06-25 DIAGNOSIS — E785 Hyperlipidemia, unspecified: Secondary | ICD-10-CM

## 2022-06-25 DIAGNOSIS — I10 Essential (primary) hypertension: Secondary | ICD-10-CM

## 2022-07-07 DIAGNOSIS — N183 Chronic kidney disease, stage 3 unspecified: Secondary | ICD-10-CM | POA: Diagnosis not present

## 2022-07-07 DIAGNOSIS — I129 Hypertensive chronic kidney disease with stage 1 through stage 4 chronic kidney disease, or unspecified chronic kidney disease: Secondary | ICD-10-CM | POA: Diagnosis not present

## 2022-07-07 DIAGNOSIS — D518 Other vitamin B12 deficiency anemias: Secondary | ICD-10-CM | POA: Diagnosis not present

## 2022-07-15 ENCOUNTER — Ambulatory Visit: Payer: No Typology Code available for payment source | Admitting: Cardiology

## 2022-07-30 ENCOUNTER — Other Ambulatory Visit: Payer: Self-pay | Admitting: Family Medicine

## 2022-08-06 ENCOUNTER — Telehealth: Payer: Self-pay | Admitting: Family Medicine

## 2022-08-06 NOTE — Telephone Encounter (Signed)
Called pt was advised to go ER, with her symptom  --Caller states that she has neck pain with headache, numbness, and dizziness. Numbness is at the base of her skull. states she cannot touch chin to chest. Pt stated she will go to ER to be seen.

## 2022-08-06 NOTE — Telephone Encounter (Signed)
Call Type Triage / Clinical Relationship To Patient Self Return Phone Number [phone] (Primary) Chief Complaint Numbness Reason for Call Symptomatic / Request for Health Information Initial Comment Caller states that she has neck pain with headache, numbness, and dizziness. Translation No Nurse Assessment Nurse: Clovis Riley, RN, Georgina Peer Date/Time (Eastern Time): 08/06/2022 9:21:41 AM Confirm and document reason for call. If symptomatic, describe symptoms. ---Caller states that she has neck pain with headache, numbness, and dizziness. Numbness is at the base of her skull. states she cannot touch chin to chest. Does the patient have any new or worsening symptoms? ---Yes Will a triage be completed? ---Yes Related visit to physician within the last 2 weeks? ---No Does the PT have any chronic conditions? (i.e. diabetes, asthma, this includes High risk factors for pregnancy, etc.) ---Yes List chronic conditions. ---obese, HTN, cholesterol Is this a behavioral health or substance abuse call? ---No  Time) Disposition Final User 08/06/2022 9:25:00 AM Go to ED Now Yes Clovis Riley, RN, Georgina Peer Final Disposition 08/06/2022 9:25:00 AM Go to ED Now Yes Clovis Riley, RN, Georgina Peer

## 2022-08-06 NOTE — Telephone Encounter (Signed)
Pt is experiencing neck pain, numbness and dizziness and wanted to make an appt. Transferred to triage.

## 2022-08-10 DIAGNOSIS — I6521 Occlusion and stenosis of right carotid artery: Secondary | ICD-10-CM | POA: Diagnosis not present

## 2022-08-10 DIAGNOSIS — I10 Essential (primary) hypertension: Secondary | ICD-10-CM | POA: Diagnosis not present

## 2022-08-10 DIAGNOSIS — Z95828 Presence of other vascular implants and grafts: Secondary | ICD-10-CM | POA: Diagnosis not present

## 2022-08-10 DIAGNOSIS — E785 Hyperlipidemia, unspecified: Secondary | ICD-10-CM | POA: Diagnosis not present

## 2022-08-10 DIAGNOSIS — G8929 Other chronic pain: Secondary | ICD-10-CM | POA: Diagnosis not present

## 2022-08-10 DIAGNOSIS — E669 Obesity, unspecified: Secondary | ICD-10-CM | POA: Diagnosis not present

## 2022-08-10 DIAGNOSIS — Z87891 Personal history of nicotine dependence: Secondary | ICD-10-CM | POA: Diagnosis not present

## 2022-08-10 DIAGNOSIS — R519 Headache, unspecified: Secondary | ICD-10-CM | POA: Diagnosis not present

## 2022-08-10 DIAGNOSIS — Z7982 Long term (current) use of aspirin: Secondary | ICD-10-CM | POA: Diagnosis not present

## 2022-08-10 DIAGNOSIS — I6523 Occlusion and stenosis of bilateral carotid arteries: Secondary | ICD-10-CM | POA: Diagnosis not present

## 2022-08-10 DIAGNOSIS — I129 Hypertensive chronic kidney disease with stage 1 through stage 4 chronic kidney disease, or unspecified chronic kidney disease: Secondary | ICD-10-CM | POA: Diagnosis not present

## 2022-08-10 DIAGNOSIS — N183 Chronic kidney disease, stage 3 unspecified: Secondary | ICD-10-CM | POA: Diagnosis not present

## 2022-08-10 NOTE — Telephone Encounter (Signed)
Pt was called again this morning to be triaged by another scheduler. She called back and advised that she was already triaged and was told to go to the ED. Pt said "she does not have an emergency, she has a headache" Pt said she saw her vascular surgeon and everything checked out okay and they advised she would need to see her PCP for medication for the headaches. Pt understands its our protocol to ensure nothing serious Is happening when appointments are made for certain things. She declined the ED recommendation from 2/29 and Triage today.

## 2022-08-12 ENCOUNTER — Ambulatory Visit (INDEPENDENT_AMBULATORY_CARE_PROVIDER_SITE_OTHER): Payer: No Typology Code available for payment source | Admitting: Family

## 2022-08-12 ENCOUNTER — Encounter: Payer: Self-pay | Admitting: Family

## 2022-08-12 VITALS — BP 139/67 | HR 66 | Temp 97.8°F | Resp 16 | Wt 198.0 lb

## 2022-08-12 DIAGNOSIS — M5412 Radiculopathy, cervical region: Secondary | ICD-10-CM | POA: Diagnosis not present

## 2022-08-12 MED ORDER — METHYLPREDNISOLONE 4 MG PO TBPK: ORAL_TABLET | ORAL | 0 refills | Status: DC

## 2022-08-12 MED ORDER — TRAMADOL HCL 50 MG PO TABS: 50.0000 mg | ORAL_TABLET | Freq: Three times a day (TID) | ORAL | 0 refills | Status: DC | PRN

## 2022-08-12 NOTE — Assessment & Plan Note (Signed)
New. Will rx with medrol dose pak, tramadol prn. Pt will let me know if symptoms worsen or if symptoms are not improved in 3-4 days.

## 2022-08-12 NOTE — Progress Notes (Signed)
Subjective:     Haynes ID: [name], female    DOB: 04/09/1951, 73 y.o.   MRN: [id number]  Chief Complaint  Haynes presents with   Neck Pain    Haynes complains of pain and numbness on neck   Headache    Complains of "strong headaches from neck pain, all on Rachel left side"   Insomnia    Haynes reports having trouble falling sleep    Neck Pain  Associated symptoms include headaches.  Headache  Associated symptoms include insomnia and neck pain.  Insomnia   Haynes is in today with c/o left sided neck pain and headache. [name] saw vascular on 08/10/22 and had a carotid duplex which showed 21-39% stenosis of Rachel right ICA and a patent left carotid stent.  Vascular surgeon did not feel that [name] pain was related to a vascular cause. Review of remote MR cervical spine 2004 noted cervical spondylosis and some facet degenerative changes bilaterally.   Reports that these symptoms began in Rachel end of January at Rachel base of [name] skull on Rachel left side only. Notes that Rachel pain would radiate up to Rachel top of [name] skull and into Rachel left TMJ region and was accompanied by headache. [name] has used tylenol without improvement. Not sleeping well due to pain.  Vascular doctor referred [name] to neurology. [name] is waiting to hear back about this appointment.   [name] denies any UE weakness or numbness.   Health Maintenance Due  Topic Date Due   COVID-19 Vaccine (6 - 2023-24 season) 05/01/2022    Past Medical History:  Diagnosis Date   Arthritis    Benign paroxysmal positional vertigo 11/14/2013   Colon polyp    Depression    Diverticulosis    Essential hypertension, benign    GERD (gastroesophageal reflux disease) 09/02/2013   GI bleed    Glaucoma    Glucose intolerance (impaired glucose tolerance)    H/O tobacco use, presenting hazards to health 09/02/2013   History  of basal cell carcinoma    History of cardiac arrhythmia    Hyperlipidemia    Hypertension    Medicare welcome visit 11/28/2012    Obesity 02/08/2017   Palpitations 02/04/2017   Personal history of colonic polyps 09/02/2013   Haynes reports H/O 2 colonoscopies, last one dates back to 2012 in [place] [name] reports they were labeled as incomplete.  Reports 1st colonoscopy had 1 benign polyp and Rachel 2nd was clear.    Renal insufficiency 04/28/2021   Sleep apnea 10/01/2016   Not on CPAP machine    Stroke [facility])    UTI (urinary tract infection)     Past Surgical History:  Procedure Laterality Date   CAROTID STENT     CERVICAL CONE BIOPSY  1960s   COLONOSCOPY  2016   DILATION AND CURETTAGE OF UTERUS  1966   KNEE SURGERY Left 11/01/2008   left knee total replacement Dr Lajean Saver TOOTH EXTRACTION      Family History  Problem Relation Age of Onset   Coronary artery disease Paternal Grandmother    Hyperlipidemia Paternal Grandmother    Hypertension Paternal Grandmother    Stroke Paternal Grandmother    Hypertension Mother    Lung cancer Mother        smoker   Uterine cancer Mother    Birth defects Mother        lung cancer in smoker   COPD Mother    Hypertension Father  Kidney disease Father        Renal Cell Carcinoma   Birth defects Father        renal cell CA, smoker   Hypertension Maternal Grandmother    Hypertension Maternal Grandfather    Hypertension Paternal Grandfather    Dementia Paternal Grandfather    Cancer Brother        testicular   Heart disease Brother        NI, smoker   Hyperlipidemia Other    Colon cancer Neg Hx    Esophageal cancer Neg Hx     Social History   Socioeconomic History   Marital status: Divorced    Spouse name: Not on file   Number of children: 0   Years of education: Not on file   Highest education level: Not on file  Occupational History   Occupation: Retired   Tobacco Use   Smoking status: Former    Packs/day: 1.00    Years: 15.00    Total pack years: 15.00    Types: Cigarettes    Quit date: 06/08/1982    Years since quitting: 40.2   Smokeless tobacco:  Never  Vaping Use   Vaping Use: Never used  Substance and Sexual Activity   Alcohol use: Yes    Comment: 1-2 glasses of wine per day   Drug use: No   Sexual activity: Not Currently    Birth control/protection: Post-menopausal    Comment: lives alone, no dietary restrictions except no lactose,  Other Topics Concern   Not on file  Social History Narrative   Occupation: unemployed Product manager- now retired   Divorced   no children    Moved from [place] [employer] alone with 4 cats   Enjoys gardening, planting.             Social Determinants of Health   Financial Resource Strain: Low Risk  (04/28/2021)   Overall Financial Resource Strain (CARDIA)    Difficulty of Paying Living Expenses: Not hard at all  Food Insecurity: No Food Insecurity (04/28/2021)   Hunger Vital Sign    Worried About Running Out of Food in Rachel Last Year: Never true    Ran Out of Food in Rachel Last Year: Never true  Transportation Needs: No Transportation Needs (04/28/2021)   PRAPARE - Hydrologist (Medical): No    Lack of Transportation (Non-Medical): No  Physical Activity: Inactive (04/28/2021)   Exercise Vital Sign    Days of Exercise per Week: 0 days    Minutes of Exercise per Session: 0 min  Stress: No Stress Concern Present (04/28/2021)   Wells    Feeling of Stress : Not at all  Social Connections: Socially Isolated (04/28/2021)   Social Connection and Isolation Panel [NHANES]    Frequency of Communication with Friends and Family: More than three times a week    Frequency of Social Gatherings with Friends and Family: More than three times a week    Attends Religious Services: Never    Marine scientist or Organizations: No    Attends Archivist Meetings: Never    Marital Status: Divorced  Human resources officer Violence: Not At Risk (04/28/2021)   Humiliation, Afraid, Rape, and Kick  questionnaire    Fear of Current or Ex-Partner: No    Emotionally Abused: No    Physically Abused: No    Sexually Abused: No  Outpatient Medications Prior to Visit  Medication Sig Dispense Refill   aspirin 81 MG tablet Take 81 mg by mouth daily.     fish oil-omega-3 fatty acids 1000 MG capsule Take 1 g by mouth daily.      hydrochlorothiazide (MICROZIDE) 12.5 MG capsule Take 1 capsule by mouth once daily 90 capsule 0   hyoscyamine (ANASPAZ) 0.125 MG TBDP disintergrating tablet Place 1 tablet (0.125 mg total) under Rachel tongue every 4 (four) hours as needed. 30 tablet 1   lovastatin (MEVACOR) 40 MG tablet Take 1 tablet (40 mg total) by mouth at bedtime. 90 tablet 3   metoprolol tartrate (LOPRESSOR) 100 MG tablet Take 1 tablet (100 mg total) by mouth 2 (two) times daily. 180 tablet 3   Multiple Vitamins-Minerals (MULTIVITAMIN WOMEN PO) Take 1 tablet by mouth daily.     Oxymetazoline HCl (NASAL SPRAY) 0.05 % SOLN Place into Rachel nose.     tretinoin (RETIN-A) 0.1 % cream APPLY TO AFFECTED AREA(S) OF FACE ONE TIME DAILY AT BEDTIME USING A PEA SIZED AMOUNT 45 g 5   No facility-administered medications prior to visit.    No Known Allergies  Review of Systems  Musculoskeletal:  Positive for neck pain.  Neurological:  Positive for headaches.  Psychiatric/Behavioral:  [name] has insomnia.        Objective:    Physical Exam Constitutional:      General: [name] is not in acute distress.    Appearance: Normal appearance. [name] is well-developed.  HENT:     Head: Normocephalic and atraumatic.     Right Ear: External ear normal.     Left Ear: External ear normal.  Eyes:     General: No scleral icterus. Neck:     Thyroid: No thyromegaly.  Cardiovascular:     Rate and Rhythm: Normal rate and regular rhythm.     Heart sounds: Normal heart sounds. No murmur heard. Pulmonary:     Effort: Pulmonary effort is normal. No respiratory distress.     Breath sounds: Normal breath sounds. No  wheezing.  Musculoskeletal:     Cervical back: Neck supple.  Skin:    General: Skin is warm and dry.  Neurological:     Mental Status: [name] is alert and oriented to person, place, and time.     Comments: Bilateral UE strength is 5/5  Psychiatric:        Mood and Affect: Mood normal.        Behavior: Behavior normal.        Thought Content: Thought content normal.        Judgment: Judgment normal.     BP 139/67 (BP Location: Right Arm, Haynes Position: Sitting, Cuff Size: Small)   Pulse 66   Temp 97.8 F (36.6 C) (Oral)   Resp 16   Wt 198 lb (89.8 kg)   SpO2 96%   BMI 37.41 kg/m  Wt Readings from Last 3 Encounters:  08/12/22 198 lb (89.8 kg)  06/04/22 199 lb 9.6 oz (90.5 kg)  05/07/22 195 lb (88.5 kg)       Assessment & Plan:   Problem List Items Addressed This Visit       Unprioritized   Cervical radiculopathy - Primary    New. Will rx with medrol dose pak, tramadol prn. [name] will let me know if symptoms worsen or if symptoms are not improved in 3-4 days.       Rachel am having [place]. Auerbach "Sheri" start on methylPREDNISolone  and traMADol. Rachel am also having [name] maintain [name] aspirin, fish oil-omega-3 fatty acids, Multiple Vitamins-Minerals (MULTIVITAMIN WOMEN PO), Nasal Spray, hyoscyamine, metoprolol tartrate, lovastatin, tretinoin, and hydrochlorothiazide.  Meds ordered this encounter  Medications   methylPREDNISolone (MEDROL DOSEPAK) 4 MG TBPK tablet    Sig: Take per package instructions.    Dispense:  21 tablet    Refill:  0    Order Specific Question:   Supervising Provider    Answer:   Penni Homans A [[id number]]   traMADol (ULTRAM) 50 MG tablet    Sig: Take 1 tablet (50 mg total) by mouth every 8 (eight) hours as needed for up to 5 days.    Dispense:  15 tablet    Refill:  0    Order Specific Question:   Supervising Provider    Answer:   Penni Homans A [id number]

## 2022-08-19 ENCOUNTER — Encounter: Payer: Self-pay | Admitting: Family Medicine

## 2022-08-19 ENCOUNTER — Other Ambulatory Visit: Payer: Self-pay | Admitting: Family Medicine

## 2022-08-19 ENCOUNTER — Other Ambulatory Visit: Payer: Self-pay | Admitting: Family

## 2022-08-19 MED ORDER — METHYLPREDNISOLONE 4 MG PO TBPK: ORAL_TABLET | ORAL | 0 refills | Status: DC

## 2022-08-19 MED ORDER — METHYLPREDNISOLONE 4 MG PO TABS: ORAL_TABLET | ORAL | 0 refills | Status: DC

## 2022-09-01 DIAGNOSIS — G4486 Cervicogenic headache: Secondary | ICD-10-CM | POA: Diagnosis not present

## 2022-09-01 DIAGNOSIS — M47812 Spondylosis without myelopathy or radiculopathy, cervical region: Secondary | ICD-10-CM | POA: Diagnosis not present

## 2022-09-08 ENCOUNTER — Other Ambulatory Visit: Payer: Self-pay | Admitting: Family

## 2022-09-08 DIAGNOSIS — G4486 Cervicogenic headache: Secondary | ICD-10-CM | POA: Diagnosis not present

## 2022-09-08 DIAGNOSIS — M542 Cervicalgia: Secondary | ICD-10-CM | POA: Diagnosis not present

## 2022-09-09 DIAGNOSIS — M316 Other giant cell arteritis: Secondary | ICD-10-CM | POA: Diagnosis not present

## 2022-09-11 DIAGNOSIS — M316 Other giant cell arteritis: Secondary | ICD-10-CM | POA: Diagnosis not present

## 2022-09-11 DIAGNOSIS — I7789 Other specified disorders of arteries and arterioles: Secondary | ICD-10-CM | POA: Diagnosis not present

## 2022-09-11 DIAGNOSIS — R519 Headache, unspecified: Secondary | ICD-10-CM | POA: Diagnosis not present

## 2022-09-11 DIAGNOSIS — Z01810 Encounter for preprocedural cardiovascular examination: Secondary | ICD-10-CM | POA: Diagnosis not present

## 2022-09-21 DIAGNOSIS — G4486 Cervicogenic headache: Secondary | ICD-10-CM | POA: Diagnosis not present

## 2022-09-21 DIAGNOSIS — M316 Other giant cell arteritis: Secondary | ICD-10-CM | POA: Diagnosis not present

## 2022-10-06 DIAGNOSIS — R0789 Other chest pain: Secondary | ICD-10-CM | POA: Diagnosis not present

## 2022-10-06 DIAGNOSIS — I10 Essential (primary) hypertension: Secondary | ICD-10-CM | POA: Diagnosis not present

## 2022-10-06 DIAGNOSIS — R0609 Other forms of dyspnea: Secondary | ICD-10-CM | POA: Diagnosis not present

## 2022-10-06 DIAGNOSIS — R002 Palpitations: Secondary | ICD-10-CM | POA: Diagnosis not present

## 2022-10-06 DIAGNOSIS — I471 Supraventricular tachycardia, unspecified: Secondary | ICD-10-CM | POA: Diagnosis not present

## 2022-10-06 DIAGNOSIS — E782 Mixed hyperlipidemia: Secondary | ICD-10-CM | POA: Diagnosis not present

## 2022-10-06 DIAGNOSIS — Z8673 Personal history of transient ischemic attack (TIA), and cerebral infarction without residual deficits: Secondary | ICD-10-CM | POA: Diagnosis not present

## 2022-10-07 DIAGNOSIS — R002 Palpitations: Secondary | ICD-10-CM | POA: Diagnosis not present

## 2022-10-13 DIAGNOSIS — R519 Headache, unspecified: Secondary | ICD-10-CM | POA: Diagnosis not present

## 2022-10-14 DIAGNOSIS — I517 Cardiomegaly: Secondary | ICD-10-CM | POA: Diagnosis not present

## 2022-10-15 ENCOUNTER — Ambulatory Visit: Payer: No Typology Code available for payment source | Admitting: Orthopedic Surgery

## 2022-10-15 ENCOUNTER — Encounter: Payer: Self-pay | Admitting: Orthopedic Surgery

## 2022-10-15 DIAGNOSIS — M19012 Primary osteoarthritis, left shoulder: Secondary | ICD-10-CM | POA: Diagnosis not present

## 2022-10-15 DIAGNOSIS — M19011 Primary osteoarthritis, right shoulder: Secondary | ICD-10-CM | POA: Diagnosis not present

## 2022-10-15 MED ORDER — HYDROCODONE-ACETAMINOPHEN 5-325 MG PO TABS: 1.0000 | ORAL_TABLET | Freq: Four times a day (QID) | ORAL | 0 refills | Status: DC | PRN

## 2022-10-15 NOTE — Progress Notes (Signed)
Office Visit Note   Patient: [name]           Date of Birth: [birth year]           MRN: [id number] Visit Date: 10/15/2022              Requested by: Bradd Canary, MD [address] [place],  [place] [address] PCP: Bradd Canary, MD  Chief Complaint  Patient presents with   Left Shoulder - Pain    S/p injection over a year ago      HPI: Patient is a 72 year old woman with bilateral shoulder pain.  Most recently she has had a radiograph with [facility] in January of this year.  Patient has had interval relief with steroid injection for the left shoulder.  Assessment & Plan: Visit Diagnoses:  1. Primary osteoarthritis of both shoulders     Plan: Both shoulders were injected she tolerated this well.  We reviewed reverse total shoulder arthroplasty.  If patient wants to consider total shoulder arthroplasty she will follow-up with Dr. August Saucer.  Follow-Up Instructions: Return if symptoms worsen or fail to improve.   Ortho Exam  Patient is alert, oriented, no adenopathy, well-dressed, normal affect, normal respiratory effort. Examination patient has abduction and flexion to 90 degrees there is crepitation with range of motion of both shoulders.  She has pain with Neer and Hawkins impingement test.  Radiographs were reviewed of the right shoulder which shows subcondylar cyst and sclerosis with bone-on-bone osteoarthritis.  Imaging: No results found. No images are attached to the encounter.  Labs: Lab Results  Component Value Date   HGBA1C 5.7 06/04/2022   HGBA1C 5.8 04/24/2021   HGBA1C 5.6 09/19/2020   LABORGA NO GROWTH 01/22/2014     Lab Results  Component Value Date   ALBUMIN 4.3 06/04/2022   ALBUMIN 4.3 09/09/2021   ALBUMIN 4.1 05/16/2021    Lab Results  Component Value Date   MG 1.8 02/21/2021   MG 2.2 03/15/2007   No results found for: "VD25OH"  No results found for: "PREALBUMIN"    Latest Ref Rng & Units 06/04/2022   11:16 AM  04/24/2021   10:36 AM 09/19/2020    3:31 PM  CBC EXTENDED  WBC 4.0 - 10.5 K/uL 6.7  5.3  5.8   RBC 3.87 - 5.11 Mil/uL 3.75  3.81  3.92   Hemoglobin 12.0 - 15.0 g/dL 19.1  47.8  29.5   HCT 36.0 - 46.0 % 36.0  35.2  37.3   Platelets 150.0 - 400.0 K/uL 259.0  236.0  217   NEUT# 1.4 - 7.7 K/uL 3.4  2.0  2,952   Lymph# 0.7 - 4.0 K/uL 2.7  2.7  2,210      There is no height or weight on file to calculate BMI.  Orders:  No orders of the defined types were placed in this encounter.  Meds ordered this encounter  Medications   HYDROcodone-acetaminophen (NORCO/VICODIN) 5-325 MG tablet    Sig: Take 1 tablet by mouth every 6 (six) hours as needed for moderate pain.    Dispense:  20 tablet    Refill:  0     Procedures: Large Joint Inj: bilateral subacromial bursa on 10/15/2022 4:49 PM Indications: diagnostic evaluation and pain Details: 22 G 1.5 in needle, posterior approach  Arthrogram: No  Outcome: tolerated well, no immediate complications Procedure, treatment alternatives, risks and benefits explained, specific risks discussed. Consent was given by the patient.  Immediately prior to procedure a time out was called to verify the correct patient, procedure, equipment, support staff and site/side marked as required. Patient was prepped and draped in the usual sterile fashion.      Clinical Data: No additional findings.  ROS:  All other systems negative, except as noted in the HPI. Review of Systems  Objective: Vital Signs: There were no vitals taken for this visit.  Specialty Comments:  No specialty comments available.  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   Transient neurologic deficit 08/18/2017   Obesity 02/08/2017   Palpitations 02/04/2017   Sleep apnea 10/01/2016   Diverticulosis 01/26/2014   Benign paroxysmal positional vertigo 11/14/2013   Urinary frequency 09/02/2013   Post-menopause 09/02/2013   H/O tobacco use, presenting hazards to health 09/02/2013   GERD (gastroesophageal reflux disease) 09/02/2013   Personal history of colonic polyps 09/02/2013   Preventative health care 11/28/2012   RHINITIS 06/27/2010   GLUCOSE INTOLERANCE 12/22/2007   Hyperlipidemia 12/20/2007   Essential hypertension, benign 12/20/2007   ARTHRITIS 12/20/2007   KNEE SPRAIN 12/20/2007   CARCINOMA, BASAL CELL, HX OF 12/20/2007   ARRHYTHMIA, HX OF 12/20/2007   GLAUCOMA, HX OF 12/20/2007   Past Medical History:  Diagnosis Date   Arthritis    Benign paroxysmal positional vertigo 11/14/2013   Colon polyp    Depression    Diverticulosis    Essential hypertension, benign    GERD (gastroesophageal reflux disease) 09/02/2013   GI bleed    Glaucoma    Glucose intolerance (impaired glucose tolerance)    H/O tobacco use, presenting hazards to health 09/02/2013   History  of basal cell carcinoma    History of cardiac arrhythmia    Hyperlipidemia    Hypertension    Medicare welcome visit 11/28/2012   Obesity 02/08/2017   Palpitations 02/04/2017   Personal history of colonic polyps 09/02/2013   Patient reports H/O 2 colonoscopies, last one dates back to 2012 in [place] She reports they were labeled as incomplete.  Reports 1st colonoscopy had 1 benign polyp and the 2nd was clear.    Renal insufficiency 04/28/2021   Sleep apnea 10/01/2016   Not on CPAP machine    Stroke [facility])    UTI (urinary tract infection)     Family History  Problem Relation Age of Onset   Coronary artery disease Paternal Grandmother    Hyperlipidemia Paternal Grandmother    Hypertension Paternal Grandmother    Stroke Paternal Grandmother    Hypertension Mother     Lung cancer Mother        smoker   Uterine cancer Mother    Birth defects Mother        lung cancer in smoker   COPD Mother    Hypertension Father    Kidney disease Father        Renal Cell Carcinoma   Birth defects Father        renal cell CA, smoker   Hypertension Maternal Grandmother    Hypertension Maternal Grandfather    Hypertension Paternal Grandfather    Dementia Paternal Grandfather    Cancer Brother        testicular  Heart disease Brother        NI, smoker   Hyperlipidemia Other    Colon cancer Neg Hx    Esophageal cancer Neg Hx     Past Surgical History:  Procedure Laterality Date   CAROTID STENT     CERVICAL CONE BIOPSY  1960s   COLONOSCOPY  2016   DILATION AND CURETTAGE OF UTERUS  1966   KNEE SURGERY Left 11/01/2008   left knee total replacement Dr Lajoyce Corners   WISDOM TOOTH EXTRACTION     Social History   Occupational History   Occupation: Retired   Tobacco Use   Smoking status: Former    Packs/day: 1.00    Years: 15.00    Additional pack years: 0.00    Total pack years: 15.00    Types: Cigarettes    Quit date: 06/08/1982    Years since quitting: 40.3   Smokeless tobacco: Never  Vaping Use   Vaping Use: Never used  Substance and Sexual Activity   Alcohol use: Yes    Comment: 1-2 glasses of wine per day   Drug use: No   Sexual activity: Not Currently    Birth control/protection: Post-menopausal    Comment: lives alone, no dietary restrictions except no lactose,

## 2022-10-17 ENCOUNTER — Other Ambulatory Visit: Payer: Self-pay | Admitting: Family Medicine

## 2022-10-17 DIAGNOSIS — I10 Essential (primary) hypertension: Secondary | ICD-10-CM

## 2022-10-20 ENCOUNTER — Encounter: Payer: Self-pay | Admitting: Orthopedic Surgery

## 2022-10-20 ENCOUNTER — Encounter: Payer: Self-pay | Admitting: Family Medicine

## 2022-10-20 ENCOUNTER — Other Ambulatory Visit: Payer: Self-pay | Admitting: Family Medicine

## 2022-10-20 DIAGNOSIS — I10 Essential (primary) hypertension: Secondary | ICD-10-CM

## 2022-10-21 DIAGNOSIS — R0609 Other forms of dyspnea: Secondary | ICD-10-CM | POA: Diagnosis not present

## 2022-10-21 DIAGNOSIS — Z8673 Personal history of transient ischemic attack (TIA), and cerebral infarction without residual deficits: Secondary | ICD-10-CM | POA: Diagnosis not present

## 2022-11-05 ENCOUNTER — Ambulatory Visit: Payer: No Typology Code available for payment source | Admitting: Orthopedic Surgery

## 2022-11-05 DIAGNOSIS — M19012 Primary osteoarthritis, left shoulder: Secondary | ICD-10-CM | POA: Diagnosis not present

## 2022-11-05 DIAGNOSIS — M7542 Impingement syndrome of left shoulder: Secondary | ICD-10-CM | POA: Diagnosis not present

## 2022-11-05 DIAGNOSIS — I471 Supraventricular tachycardia, unspecified: Secondary | ICD-10-CM | POA: Diagnosis not present

## 2022-11-05 DIAGNOSIS — M19011 Primary osteoarthritis, right shoulder: Secondary | ICD-10-CM

## 2022-11-05 DIAGNOSIS — R002 Palpitations: Secondary | ICD-10-CM | POA: Diagnosis not present

## 2022-11-05 MED ORDER — HYDROCODONE-ACETAMINOPHEN 5-325 MG PO TABS: 1.0000 | ORAL_TABLET | Freq: Four times a day (QID) | ORAL | 0 refills | Status: DC | PRN

## 2022-11-05 NOTE — Progress Notes (Signed)
Office Visit Note   Patient: [name]           Date of Birth: 07-May-1951           MRN: [id number] Visit Date: 11/05/2022              Requested by: Bradd Canary, MD [address] [place],  [place] [address] PCP: Bradd Canary, MD  Chief Complaint  Patient presents with   Left Shoulder - Pain    S/p injection 10/15/2022      HPI: Patient is a 72 year old woman who presents in follow-up for impingement with osteoarthritis left shoulder.  Assessment & Plan: Visit Diagnoses: No diagnosis found.  Plan: Patient underwent left subacromial injection of her shoulder.  Prescription was called in for Vicodin.  Follow-Up Instructions: No follow-ups on file.   Ortho Exam  Patient is alert, oriented, no adenopathy, well-dressed, normal affect, normal respiratory effort. Examination patient has decreased abduction and flexion of the left shoulder she has pain with Neer and Hawkins impingement test.  Imaging: No results found. No images are attached to the encounter.  Labs: Lab Results  Component Value Date   HGBA1C 5.7 06/04/2022   HGBA1C 5.8 04/24/2021   HGBA1C 5.6 09/19/2020   LABORGA NO GROWTH 01/22/2014     Lab Results  Component Value Date   ALBUMIN 4.3 06/04/2022   ALBUMIN 4.3 09/09/2021   ALBUMIN 4.1 05/16/2021    Lab Results  Component Value Date   MG 1.8 02/21/2021   MG 2.2 03/15/2007   No results found for: "VD25OH"  No results found for: "PREALBUMIN"    Latest Ref Rng & Units 06/04/2022   11:16 AM 04/24/2021   10:36 AM 09/19/2020    3:31 PM  CBC EXTENDED  WBC 4.0 - 10.5 K/uL 6.7  5.3  5.8   RBC 3.87 - 5.11 Mil/uL 3.75  3.81  3.92   Hemoglobin 12.0 - 15.0 g/dL 41.3  24.4  01.0   HCT 36.0 - 46.0 % 36.0  35.2  37.3   Platelets 150.0 - 400.0 K/uL 259.0  236.0  217   NEUT# 1.4 - 7.7 K/uL 3.4  2.0  2,952   Lymph# 0.7 - 4.0 K/uL 2.7  2.7  2,210      There is no height or weight on file to calculate BMI.  Orders:  No orders  of the defined types were placed in this encounter.  Meds ordered this encounter  Medications   HYDROcodone-acetaminophen (NORCO/VICODIN) 5-325 MG tablet    Sig: Take 1 tablet by mouth every 6 (six) hours as needed for moderate pain.    Dispense:  20 tablet    Refill:  0     Procedures: Large Joint Inj: L subacromial bursa on 11/08/2022 12:39 PM Indications: diagnostic evaluation and pain Details: 22 G 1.5 in needle, posterior approach  Arthrogram: No  Medications: 5 mL lidocaine 1 %; 40 mg methylPREDNISolone acetate 40 MG/ML Outcome: tolerated well, no immediate complications Procedure, treatment alternatives, risks and benefits explained, specific risks discussed. Consent was given by the patient. Immediately prior to procedure a time out was called to verify the correct patient, procedure, equipment, support staff and site/side marked as required. Patient was prepped and draped in the usual sterile fashion.      Clinical Data: No additional findings.  ROS:  All other systems negative, except as noted in the HPI. Review of Systems  Objective: Vital Signs: There were no  vitals taken for this visit.  Specialty Comments:  No specialty comments available.  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   Transient neurologic deficit 08/18/2017   Obesity 02/08/2017   Palpitations 02/04/2017   Sleep apnea 10/01/2016   Diverticulosis 01/26/2014   Benign paroxysmal positional vertigo 11/14/2013   Urinary frequency 09/02/2013   Post-menopause 09/02/2013   H/O tobacco use, presenting hazards to health 09/02/2013   GERD (gastroesophageal reflux  disease) 09/02/2013   Personal history of colonic polyps 09/02/2013   Preventative health care 11/28/2012   RHINITIS 06/27/2010   GLUCOSE INTOLERANCE 12/22/2007   Hyperlipidemia 12/20/2007   Essential hypertension, benign 12/20/2007   ARTHRITIS 12/20/2007   KNEE SPRAIN 12/20/2007   CARCINOMA, BASAL CELL, HX OF 12/20/2007   ARRHYTHMIA, HX OF 12/20/2007   GLAUCOMA, HX OF 12/20/2007   Past Medical History:  Diagnosis Date   Arthritis    Benign paroxysmal positional vertigo 11/14/2013   Colon polyp    Depression    Diverticulosis    Essential hypertension, benign    GERD (gastroesophageal reflux disease) 09/02/2013   GI bleed    Glaucoma    Glucose intolerance (impaired glucose tolerance)    H/O tobacco use, presenting hazards to health 09/02/2013   History  of basal cell carcinoma    History of cardiac arrhythmia    Hyperlipidemia    Hypertension    Medicare welcome visit 11/28/2012   Obesity 02/08/2017   Palpitations 02/04/2017   Personal history of colonic polyps 09/02/2013   Patient reports H/O 2 colonoscopies, last one dates back to 2012 in [place] She reports they were labeled as incomplete.  Reports 1st colonoscopy had 1 benign polyp and the 2nd was clear.    Renal insufficiency 04/28/2021   Sleep apnea 10/01/2016   Not on CPAP machine    Stroke [facility])    UTI (urinary tract infection)     Family History  Problem Relation Age of Onset   Coronary artery disease Paternal Grandmother    Hyperlipidemia Paternal Grandmother    Hypertension Paternal Grandmother    Stroke Paternal Grandmother    Hypertension Mother    Lung cancer Mother        smoker   Uterine cancer Mother    Birth defects Mother        lung cancer in smoker   COPD Mother    Hypertension Father    Kidney disease Father        Renal Cell Carcinoma   Birth defects Father        renal cell CA, smoker   Hypertension Maternal Grandmother    Hypertension Maternal Grandfather    Hypertension Paternal Grandfather     Dementia Paternal Grandfather    Cancer Brother        testicular   Heart disease Brother        NI, smoker   Hyperlipidemia Other    Colon cancer Neg Hx    Esophageal cancer Neg Hx     Past Surgical History:  Procedure Laterality Date   CAROTID STENT     CERVICAL CONE BIOPSY  1960s   COLONOSCOPY  2016   DILATION AND CURETTAGE  OF UTERUS  1966   KNEE SURGERY Left 11/01/2008   left knee total replacement Dr Lajoyce Corners   WISDOM TOOTH EXTRACTION     Social History   Occupational History   Occupation: Retired   Tobacco Use   Smoking status: Former    Packs/day: 1.00    Years: 15.00    Additional pack years: 0.00    Total pack years: 15.00    Types: Cigarettes    Quit date: 06/08/1982    Years since quitting: 40.4   Smokeless tobacco: Never  Vaping Use   Vaping Use: Never used  Substance and Sexual Activity   Alcohol use: Yes    Comment: 1-2 glasses of wine per day   Drug use: No   Sexual activity: Not Currently    Birth control/protection: Post-menopausal    Comment: lives alone, no dietary restrictions except no lactose,

## 2022-11-08 ENCOUNTER — Encounter: Payer: Self-pay | Admitting: Orthopedic Surgery

## 2022-11-08 DIAGNOSIS — M7542 Impingement syndrome of left shoulder: Secondary | ICD-10-CM

## 2022-11-08 MED ORDER — METHYLPREDNISOLONE ACETATE 40 MG/ML IJ SUSP
40.0000 mg | INTRAMUSCULAR | Status: AC | PRN
Start: 2022-11-08 — End: 2022-11-08
  Administered 2022-11-08: 40 mg via INTRA_ARTICULAR

## 2022-11-08 MED ORDER — LIDOCAINE HCL 1 % IJ SOLN
5.0000 mL | INTRAMUSCULAR | Status: AC | PRN
Start: 2022-11-08 — End: 2022-11-08
  Administered 2022-11-08: 5 mL

## 2022-11-13 ENCOUNTER — Other Ambulatory Visit: Payer: Self-pay | Admitting: Family Medicine

## 2022-11-13 ENCOUNTER — Encounter: Payer: Self-pay | Admitting: Family Medicine

## 2022-11-13 DIAGNOSIS — W57XXXA Bitten or stung by nonvenomous insect and other nonvenomous arthropods, initial encounter: Secondary | ICD-10-CM

## 2022-11-13 NOTE — Telephone Encounter (Signed)
Called pt lab appt has been made

## 2022-11-16 ENCOUNTER — Ambulatory Visit: Payer: No Typology Code available for payment source | Admitting: Orthopedic Surgery

## 2022-11-16 ENCOUNTER — Encounter: Payer: Self-pay | Admitting: Orthopedic Surgery

## 2022-11-16 ENCOUNTER — Other Ambulatory Visit (INDEPENDENT_AMBULATORY_CARE_PROVIDER_SITE_OTHER): Payer: No Typology Code available for payment source

## 2022-11-16 DIAGNOSIS — M542 Cervicalgia: Secondary | ICD-10-CM

## 2022-11-16 DIAGNOSIS — R5383 Other fatigue: Secondary | ICD-10-CM

## 2022-11-16 DIAGNOSIS — M25512 Pain in left shoulder: Secondary | ICD-10-CM | POA: Diagnosis not present

## 2022-11-16 DIAGNOSIS — M7542 Impingement syndrome of left shoulder: Secondary | ICD-10-CM | POA: Diagnosis not present

## 2022-11-16 DIAGNOSIS — M79602 Pain in left arm: Secondary | ICD-10-CM | POA: Diagnosis not present

## 2022-11-16 DIAGNOSIS — M79601 Pain in right arm: Secondary | ICD-10-CM | POA: Diagnosis not present

## 2022-11-16 DIAGNOSIS — M79605 Pain in left leg: Secondary | ICD-10-CM | POA: Diagnosis not present

## 2022-11-16 DIAGNOSIS — W57XXXA Bitten or stung by nonvenomous insect and other nonvenomous arthropods, initial encounter: Secondary | ICD-10-CM

## 2022-11-16 MED ORDER — LIDOCAINE HCL 1 % IJ SOLN
5.0000 mL | INTRAMUSCULAR | Status: AC | PRN
Start: 2022-11-16 — End: 2022-11-16
  Administered 2022-11-16: 5 mL

## 2022-11-16 MED ORDER — METHYLPREDNISOLONE ACETATE 40 MG/ML IJ SUSP
40.0000 mg | INTRAMUSCULAR | Status: AC | PRN
Start: 2022-11-16 — End: 2022-11-16
  Administered 2022-11-16: 40 mg via INTRA_ARTICULAR

## 2022-11-16 NOTE — Progress Notes (Signed)
Office Visit Note   Patient: [name]           Date of Birth: June 29, 1950           MRN: [id number] Visit Date: 11/16/2022              Requested by: Bradd Canary, MD [id number] [place],  [place] [address] PCP: Bradd Canary, MD  Chief Complaint  Patient presents with   Left Shoulder - Pain    S/p injection 11/05/2022      HPI: Patient is a 72 year old woman with worsening left shoulder symptoms.  She has had steroid injections with minimal relief.  She states she has difficulty getting her hand to the top of her head and has weakness with trying to hold things in her left hand.  Patient complains of pain anteriorly over the shoulder.  Assessment & Plan: Visit Diagnoses:  1. Impingement syndrome of left shoulder     Plan: Will request an MRI scan of her left shoulder.  Patient may benefit from arthroscopic intervention.  A repeat subacromial injection was provided today.  Follow-Up Instructions: No follow-ups on file.   Ortho Exam  Patient is alert, oriented, no adenopathy, well-dressed, normal affect, normal respiratory effort. Examination patient has difficulty getting her hand to the top of her head.  She has pain with Neer and Hawkins impingement test pain to palpation over the biceps tendon.  Imaging: No results found. No images are attached to the encounter.  Labs: Lab Results  Component Value Date   HGBA1C 5.7 06/04/2022   HGBA1C 5.8 04/24/2021   HGBA1C 5.6 09/19/2020   LABORGA NO GROWTH 01/22/2014     Lab Results  Component Value Date   ALBUMIN 4.3 06/04/2022   ALBUMIN 4.3 09/09/2021   ALBUMIN 4.1 05/16/2021    Lab Results  Component Value Date   MG 1.8 02/21/2021   MG 2.2 03/15/2007   No results found for: "VD25OH"  No results found for: "PREALBUMIN"    Latest Ref Rng & Units 06/04/2022   11:16 AM 04/24/2021   10:36 AM 09/19/2020    3:31 PM  CBC EXTENDED  WBC 4.0 - 10.5 K/uL 6.7  5.3  5.8   RBC 3.87 - 5.11  Mil/uL 3.75  3.81  3.92   Hemoglobin 12.0 - 15.0 g/dL 96.2  95.2  84.1   HCT 36.0 - 46.0 % 36.0  35.2  37.3   Platelets 150.0 - 400.0 K/uL 259.0  236.0  217   NEUT# 1.4 - 7.7 K/uL 3.4  2.0  2,952   Lymph# 0.7 - 4.0 K/uL 2.7  2.7  2,210      There is no height or weight on file to calculate BMI.  Orders:  No orders of the defined types were placed in this encounter.  No orders of the defined types were placed in this encounter.    Procedures: Large Joint Inj: L subacromial bursa on 11/16/2022 4:03 PM Indications: diagnostic evaluation and pain Details: 22 G 1.5 in needle, posterior approach  Arthrogram: No  Medications: 5 mL lidocaine 1 %; 40 mg methylPREDNISolone acetate 40 MG/ML Outcome: tolerated well, no immediate complications Procedure, treatment alternatives, risks and benefits explained, specific risks discussed. Consent was given by the patient. Immediately prior to procedure a time out was called to verify the correct patient, procedure, equipment, support staff and site/side marked as required. Patient was prepped and draped in the usual sterile fashion.  Clinical Data: No additional findings.  ROS:  All other systems negative, except as noted in the HPI. Review of Systems  Objective: Vital Signs: There were no vitals taken for this visit.  Specialty Comments:  No specialty comments available.  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   Transient neurologic deficit 08/18/2017   Obesity 02/08/2017   Palpitations 02/04/2017   Sleep apnea 10/01/2016   Diverticulosis 01/26/2014   Benign paroxysmal positional vertigo  11/14/2013   Urinary frequency 09/02/2013   Post-menopause 09/02/2013   H/O tobacco use, presenting hazards to health 09/02/2013   GERD (gastroesophageal reflux disease) 09/02/2013   Personal history of colonic polyps 09/02/2013   Preventative health care 11/28/2012   RHINITIS 06/27/2010   GLUCOSE INTOLERANCE 12/22/2007   Hyperlipidemia 12/20/2007   Essential hypertension, benign 12/20/2007   ARTHRITIS 12/20/2007   KNEE SPRAIN 12/20/2007   CARCINOMA, BASAL CELL, HX OF 12/20/2007   ARRHYTHMIA, HX OF 12/20/2007   GLAUCOMA, HX OF 12/20/2007   Past Medical History:  Diagnosis Date   Arthritis    Benign paroxysmal positional vertigo 11/14/2013   Colon polyp    Depression    Diverticulosis    Essential hypertension, benign    GERD (gastroesophageal reflux disease) 09/02/2013   GI bleed    Glaucoma    Glucose intolerance (impaired glucose tolerance)    H/O tobacco use, presenting hazards to health 09/02/2013   History  of basal cell carcinoma    History of cardiac arrhythmia    Hyperlipidemia    Hypertension    Medicare welcome visit 11/28/2012   Obesity 02/08/2017   Palpitations 02/04/2017   Personal history of colonic polyps 09/02/2013   Patient reports H/O 2 colonoscopies, last one dates back to 2012 in [place] She reports they were labeled as incomplete.  Reports 1st colonoscopy had 1 benign polyp and the 2nd was clear.    Renal insufficiency 04/28/2021   Sleep apnea 10/01/2016   Not on CPAP machine    Stroke [facility])    UTI (urinary tract infection)     Family History  Problem Relation Age of Onset   Coronary artery disease Paternal Grandmother    Hyperlipidemia Paternal Grandmother    Hypertension Paternal Grandmother    Stroke Paternal Grandmother    Hypertension Mother    Lung cancer Mother        smoker   Uterine cancer Mother    Birth defects Mother        lung cancer in smoker   COPD Mother    Hypertension Father    Kidney disease Father        Renal Cell Carcinoma    Birth defects Father        renal cell CA, smoker   Hypertension Maternal Grandmother    Hypertension Maternal Grandfather    Hypertension Paternal Grandfather    Dementia Paternal Grandfather    Cancer Brother        testicular   Heart disease Brother        NI, smoker   Hyperlipidemia Other    Colon cancer Neg Hx    Esophageal cancer Neg Hx     Past Surgical  History:  Procedure Laterality Date   CAROTID STENT     CERVICAL CONE BIOPSY  1960s   COLONOSCOPY  2016   DILATION AND CURETTAGE OF UTERUS  1966   KNEE SURGERY Left 11/01/2008   left knee total replacement Dr Lajoyce Corners   WISDOM TOOTH EXTRACTION     Social History   Occupational History   Occupation: Retired   Tobacco Use   Smoking status: Former    Packs/day: 1.00    Years: 15.00    Additional pack years: 0.00    Total pack years: 15.00    Types: Cigarettes    Quit date: 06/08/1982    Years since quitting: 40.4   Smokeless tobacco: Never  Vaping Use   Vaping Use: Never used  Substance and Sexual Activity   Alcohol use: Yes    Comment: 1-2 glasses of wine per day   Drug use: No   Sexual activity: Not Currently    Birth control/protection: Post-menopausal    Comment: lives alone, no dietary restrictions except no lactose,

## 2022-11-17 LAB — ALPHA-GAL PANEL
Allergen, Mutton, f88: <0.1 kU/L
Allergen, Pork, f26: <0.1 kU/L
Beef: <0.1 kU/L

## 2022-11-18 ENCOUNTER — Other Ambulatory Visit: Payer: Self-pay | Admitting: Orthopedic Surgery

## 2022-11-18 ENCOUNTER — Other Ambulatory Visit (HOSPITAL_BASED_OUTPATIENT_CLINIC_OR_DEPARTMENT_OTHER): Payer: Self-pay | Admitting: Family Medicine

## 2022-11-18 DIAGNOSIS — Z1231 Encounter for screening mammogram for malignant neoplasm of breast: Secondary | ICD-10-CM

## 2022-11-18 DIAGNOSIS — G4733 Obstructive sleep apnea (adult) (pediatric): Secondary | ICD-10-CM | POA: Diagnosis not present

## 2022-11-18 LAB — LYME DISEASE SEROLOGY W/REFLEX: Lyme Total Antibody EIA: NEGATIVE

## 2022-11-18 MED ORDER — HYDROCODONE-ACETAMINOPHEN 5-325 MG PO TABS: 1.0000 | ORAL_TABLET | Freq: Three times a day (TID) | ORAL | 0 refills | Status: DC | PRN

## 2022-11-19 ENCOUNTER — Encounter: Payer: Self-pay | Admitting: Orthopedic Surgery

## 2022-11-20 ENCOUNTER — Encounter: Payer: Self-pay | Admitting: Orthopedic Surgery

## 2022-11-20 ENCOUNTER — Other Ambulatory Visit: Payer: Self-pay | Admitting: Orthopedic Surgery

## 2022-11-20 MED ORDER — OXYCODONE-ACETAMINOPHEN 5-325 MG PO TABS: 1.0000 | ORAL_TABLET | Freq: Every evening | ORAL | 0 refills | Status: DC | PRN

## 2022-11-21 ENCOUNTER — Encounter: Payer: Self-pay | Admitting: Orthopedic Surgery

## 2022-11-21 LAB — ALPHA-GAL PANEL: Class: 0

## 2022-11-22 ENCOUNTER — Ambulatory Visit (HOSPITAL_BASED_OUTPATIENT_CLINIC_OR_DEPARTMENT_OTHER): Payer: No Typology Code available for payment source

## 2022-11-22 ENCOUNTER — Ambulatory Visit: Payer: No Typology Code available for payment source

## 2022-11-23 ENCOUNTER — Ambulatory Visit (HOSPITAL_COMMUNITY)
Admission: RE | Admit: 2022-11-23 | Discharge: 2022-11-23 | Disposition: A | Discharge status: Home / Self Care | Payer: No Typology Code available for payment source | Source: Ambulatory Visit | Attending: Orthopedic Surgery | Admitting: Orthopedic Surgery

## 2022-11-23 DIAGNOSIS — M7542 Impingement syndrome of left shoulder: Secondary | ICD-10-CM | POA: Insufficient documentation

## 2022-11-23 DIAGNOSIS — M19012 Primary osteoarthritis, left shoulder: Secondary | ICD-10-CM | POA: Diagnosis not present

## 2022-11-23 DIAGNOSIS — M25412 Effusion, left shoulder: Secondary | ICD-10-CM | POA: Diagnosis not present

## 2022-11-23 LAB — INTERPRETATION:

## 2022-11-23 LAB — ROCKY MTN SPOTTED FVR ABS PNL(IGG+IGM)
RMSF IgG: NOT DETECTED
RMSF IgM: NOT DETECTED

## 2022-11-23 LAB — ALPHA-GAL PANEL
CLASS: 0
CLASS: 0
GALACTOSE-ALPHA-1,3-GALACTOSE IGE*: <0.1 kU/L (ref <0.10)

## 2022-11-23 LAB — EHRLICHIA ANTIBODY PANEL
E. CHAFFEENSIS AB IGG: <1:64 {titer}
E. CHAFFEENSIS AB IGM: <1:20 {titer}

## 2022-11-26 ENCOUNTER — Encounter: Payer: Self-pay | Admitting: Orthopedic Surgery

## 2022-11-26 NOTE — Telephone Encounter (Signed)
This was completed at [facility] on 06/17

## 2022-11-29 NOTE — Assessment & Plan Note (Signed)
hgba1c acceptable, minimize simple carbs. Increase exercise as tolerated.  

## 2022-11-29 NOTE — Progress Notes (Unsigned)
Subjective:    Patient ID: [name], female    DOB: March 19, 1951, 72 y.o.   MRN: [id number]  No chief complaint on file.   HPI Discussed the use of AI scribe software for clinical note transcription with the patient, who gave verbal consent to proceed.  History of Present Illness   Patient is a 72 yo female in today for follow up on chronic medical concerns and for annual preventative exam. No recent febrile illness or hospitalizations. Denies CP/palp/SOB/HA/congestion/fevers/GI or GU c/o. Taking meds as prescribed           Past Medical History:  Diagnosis Date   Arthritis    Benign paroxysmal positional vertigo 11/14/2013   Colon polyp    Depression    Diverticulosis    Essential hypertension, benign    GERD (gastroesophageal reflux disease) 09/02/2013   GI bleed    Glaucoma    Glucose intolerance (impaired glucose tolerance)    H/O tobacco use, presenting hazards to health 09/02/2013   History  of basal cell carcinoma    History of cardiac arrhythmia    Hyperlipidemia    Hypertension    Medicare welcome visit 11/28/2012   Obesity 02/08/2017   Palpitations 02/04/2017   Personal history of colonic polyps 09/02/2013   Patient reports H/O 2 colonoscopies, last one dates back to 2012 in [place] She reports they were labeled as incomplete.  Reports 1st colonoscopy had 1 benign polyp and the 2nd was clear.    Renal insufficiency 04/28/2021   Sleep apnea 10/01/2016   Not on CPAP machine    Stroke [facility])    UTI (urinary tract infection)     Past Surgical History:  Procedure Laterality Date   CAROTID STENT     CERVICAL CONE BIOPSY  1960s   COLONOSCOPY  2016   DILATION AND CURETTAGE OF UTERUS  1966   KNEE SURGERY Left 11/01/2008   left knee total replacement Dr Lajoyce Corners   WISDOM TOOTH EXTRACTION      Family History  Problem Relation Age of Onset   Coronary artery disease Paternal Grandmother    Hyperlipidemia Paternal Grandmother    Hypertension Paternal Grandmother    Stroke  Paternal Grandmother    Hypertension Mother    Lung cancer Mother        smoker   Uterine cancer Mother    Birth defects Mother        lung cancer in smoker   COPD Mother    Hypertension Father    Kidney disease Father        Renal Cell Carcinoma   Birth defects Father        renal cell CA, smoker   Hypertension Maternal Grandmother    Hypertension Maternal Grandfather    Hypertension Paternal Grandfather    Dementia Paternal Grandfather    Cancer Brother        testicular   Heart disease Brother        NI, smoker   Hyperlipidemia Other    Colon cancer Neg Hx    Esophageal cancer Neg Hx     Social History   Socioeconomic History   Marital status: Divorced    Spouse name: Not on file   Number of children: 0   Years of education: Not on file   Highest education level: Not on file  Occupational History   Occupation: Retired   Tobacco Use   Smoking status: Former    Packs/day: 1.00    Years: 15.00  Additional pack years: 0.00    Total pack years: 15.00    Types: Cigarettes    Quit date: 06/08/1982    Years since quitting: 40.5   Smokeless tobacco: Never  Vaping Use   Vaping Use: Never used  Substance and Sexual Activity   Alcohol use: Yes    Comment: 1-2 glasses of wine per day   Drug use: No   Sexual activity: Not Currently    Birth control/protection: Post-menopausal    Comment: lives alone, no dietary restrictions except no lactose,  Other Topics Concern   Not on file  Social History Narrative   Occupation: unemployed Education administrator- now retired   Divorced   no children    Moved from [place]    Lives alone with 4 cats   Enjoys gardening, planting.             Social Determinants of Health   Financial Resource Strain: Low Risk  (04/28/2021)   Overall Financial Resource Strain (CARDIA)    Difficulty of Paying Living Expenses: Not hard at all  Food Insecurity: No Food Insecurity (04/28/2021)   Hunger Vital Sign    Worried About Running Out  of Food in the Last Year: Never true    Ran Out of Food in the Last Year: Never true  Transportation Needs: No Transportation Needs (04/28/2021)   PRAPARE - Administrator, Civil Service (Medical): No    Lack of Transportation (Non-Medical): No  Physical Activity: Inactive (04/28/2021)   Exercise Vital Sign    Days of Exercise per Week: 0 days    Minutes of Exercise per Session: 0 min  Stress: No Stress Concern Present (04/28/2021)   [employer] of Occupational Health - Occupational Stress Questionnaire    Feeling of Stress : Not at all  Social Connections: Socially Isolated (04/28/2021)   Social Connection and Isolation Panel [NHANES]    Frequency of Communication with Friends and Family: More than three times a week    Frequency of Social Gatherings with Friends and Family: More than three times a week    Attends Religious Services: Never    Database administrator or Organizations: No    Attends Banker Meetings: Never    Marital Status: Divorced  Catering manager Violence: Not At Risk (04/28/2021)   Humiliation, Afraid, Rape, and Kick questionnaire    Fear of Current or Ex-Partner: No    Emotionally Abused: No    Physically Abused: No    Sexually Abused: No    Outpatient Medications Prior to Visit  Medication Sig Dispense Refill   aspirin 81 MG tablet Take 81 mg by mouth daily.     fish oil-omega-3 fatty acids 1000 MG capsule Take 1 g by mouth daily.      hydrochlorothiazide (MICROZIDE) 12.5 MG capsule Take 1 capsule by mouth once daily 90 capsule 0   HYDROcodone-acetaminophen (NORCO/VICODIN) 5-325 MG tablet Take 1 tablet by mouth every 8 (eight) hours as needed for moderate pain. 15 tablet 0   hyoscyamine (ANASPAZ) 0.125 MG TBDP disintergrating tablet Place 1 tablet (0.125 mg total) under the tongue every 4 (four) hours as needed. 30 tablet 1   lovastatin (MEVACOR) 40 MG tablet Take 1 tablet (40 mg total) by mouth at bedtime. 90 tablet 3    methylPREDNISolone (MEDROL) 4 MG tablet 5 tabs po x 3 day then 4 tabs po x 3 day then 3 tabs po x 3 day then 2 tabs po x 3 day  then 1 tab po x 3 day and stop 45 tablet 0   metoprolol tartrate (LOPRESSOR) 100 MG tablet Take 1 tablet by mouth twice daily 180 tablet 0   Multiple Vitamins-Minerals (MULTIVITAMIN WOMEN PO) Take 1 tablet by mouth daily.     oxyCODONE-acetaminophen (PERCOCET/ROXICET) 5-325 MG tablet Take 1 tablet by mouth at bedtime as needed for severe pain. 20 tablet 0   Oxymetazoline HCl (NASAL SPRAY) 0.05 % SOLN Place into the nose.     traMADol (ULTRAM) 50 MG tablet TAKE 1 TABLET BY MOUTH EVERY 8 HOURS AS NEEDED FOR  UP  TO  5  DAYS 15 tablet 0   tretinoin (RETIN-A) 0.1 % cream APPLY TO AFFECTED AREA(S) OF FACE ONE TIME DAILY AT BEDTIME USING A PEA SIZED AMOUNT 45 g 5   No facility-administered medications prior to visit.    No Known Allergies  Review of Systems  Constitutional:  Negative for chills, fever and malaise/fatigue.  HENT:  Negative for congestion and hearing loss.   Eyes:  Negative for blurred vision and discharge.  Respiratory:  Negative for cough, sputum production and shortness of breath.   Cardiovascular:  Negative for chest pain, palpitations and leg swelling.  Gastrointestinal:  Negative for abdominal pain, blood in stool, constipation, diarrhea, heartburn, nausea and vomiting.  Genitourinary:  Negative for dysuria, frequency, hematuria and urgency.  Musculoskeletal:  Negative for back pain, falls and myalgias.  Skin:  Negative for rash.  Neurological:  Negative for dizziness, sensory change, loss of consciousness, weakness and headaches.  Endo/Heme/Allergies:  Negative for environmental allergies. Does not bruise/bleed easily.  Psychiatric/Behavioral:  Negative for depression and suicidal ideas. The patient is not nervous/anxious and does not have insomnia.       Objective:    Physical Exam Constitutional:      General: She is not in acute distress.     Appearance: Normal appearance. She is not diaphoretic.  HENT:     Head: Normocephalic and atraumatic.     Right Ear: Tympanic membrane, ear canal and external ear normal.     Left Ear: Tympanic membrane, ear canal and external ear normal.     Nose: Nose normal.     Mouth/Throat:     Mouth: Mucous membranes are moist.     Pharynx: Oropharynx is clear. No oropharyngeal exudate.  Eyes:     General: No scleral icterus.       Right eye: No discharge.        Left eye: No discharge.     Conjunctiva/sclera: Conjunctivae normal.     Pupils: Pupils are equal, round, and reactive to light.  Neck:     Thyroid: No thyromegaly.  Cardiovascular:     Rate and Rhythm: Normal rate and regular rhythm.     Heart sounds: Normal heart sounds. No murmur heard. Pulmonary:     Effort: Pulmonary effort is normal. No respiratory distress.     Breath sounds: Normal breath sounds. No wheezing or rales.  Abdominal:     General: Bowel sounds are normal. There is no distension.     Palpations: Abdomen is soft. There is no mass.     Tenderness: There is no abdominal tenderness.  Musculoskeletal:        General: No tenderness. Normal range of motion.     Cervical back: Normal range of motion and neck supple.  Lymphadenopathy:     Cervical: No cervical adenopathy.  Skin:    General: Skin is warm and dry.  Findings: No rash.  Neurological:     General: No focal deficit present.     Mental Status: She is alert and oriented to person, place, and time.     Cranial Nerves: No cranial nerve deficit.     Coordination: Coordination normal.     Deep Tendon Reflexes: Reflexes are normal and symmetric. Reflexes normal.  Psychiatric:        Mood and Affect: Mood normal.        Behavior: Behavior normal.        Thought Content: Thought content normal.        Judgment: Judgment normal.   There were no vitals taken for this visit. Wt Readings from Last 3 Encounters:  08/12/22 198 lb (89.8 kg)  06/04/22 199 lb  9.6 oz (90.5 kg)  05/07/22 195 lb (88.5 kg)    Diabetic Foot Exam - Simple   No data filed    Lab Results  Component Value Date   WBC 6.7 06/04/2022   HGB 12.0 06/04/2022   HCT 36.0 06/04/2022   PLT 259.0 06/04/2022   GLUCOSE 101 (H) 06/04/2022   CHOL 182 06/04/2022   TRIG 207.0 (H) 06/04/2022   HDL 51.70 06/04/2022   LDLDIRECT 97.0 06/04/2022   LDLCALC 66 04/24/2021   ALT 14 06/04/2022   AST 18 06/04/2022   NA 139 06/04/2022   K 4.4 06/04/2022   CL 101 06/04/2022   CREATININE 1.40 (H) 06/04/2022   BUN 32 (H) 06/04/2022   CO2 30 06/04/2022   TSH 2.48 06/04/2022   INR 1.1 10/06/2019   HGBA1C 5.7 06/04/2022   MICROALBUR <0.7 06/04/2022    Lab Results  Component Value Date   TSH 2.48 06/04/2022   Lab Results  Component Value Date   WBC 6.7 06/04/2022   HGB 12.0 06/04/2022   HCT 36.0 06/04/2022   MCV 96.2 06/04/2022   PLT 259.0 06/04/2022   Lab Results  Component Value Date   NA 139 06/04/2022   K 4.4 06/04/2022   CO2 30 06/04/2022   GLUCOSE 101 (H) 06/04/2022   BUN 32 (H) 06/04/2022   CREATININE 1.40 (H) 06/04/2022   BILITOT 0.4 06/04/2022   ALKPHOS 43 06/04/2022   AST 18 06/04/2022   ALT 14 06/04/2022   PROT 7.0 06/04/2022   ALBUMIN 4.3 06/04/2022   CALCIUM 9.7 06/04/2022   ANIONGAP 11 10/06/2019   GFR 37.20 (L) 06/04/2022   Lab Results  Component Value Date   CHOL 182 06/04/2022   Lab Results  Component Value Date   HDL 51.70 06/04/2022   Lab Results  Component Value Date   LDLCALC 66 04/24/2021   Lab Results  Component Value Date   TRIG 207.0 (H) 06/04/2022   Lab Results  Component Value Date   CHOLHDL 4 06/04/2022   Lab Results  Component Value Date   HGBA1C 5.7 06/04/2022       Assessment & Plan:  Hyperlipidemia, unspecified hyperlipidemia type Assessment & Plan: Encourage heart healthy diet such as MIND or DASH diet, increase exercise, avoid trans fats, simple carbohydrates and processed foods, consider a krill or fish or  flaxseed oil cap daily.    Essential hypertension, benign Assessment & Plan: Monitor and report any concerns, no changes to meds. Encouraged heart healthy diet such as the DASH diet and exercise as tolerated.    Preventative health care Assessment & Plan: Patient encouraged to maintain heart healthy diet, regular exercise, adequate sleep. Consider daily probiotics. Take medications as prescribed. Labs  ordered and reviewed. Colonoscopy done in 2021 repeat in 2026. Dexa scan 2022 repeat in 2-3  years. MGM 03/2022 repeat in next year..    Renal insufficiency Assessment & Plan: Hydrate and monitor    GLUCOSE INTOLERANCE Assessment & Plan: hgba1c acceptable, minimize simple carbs. Increase exercise as tolerated.      Assessment and Plan              Danise Edge, MD

## 2022-11-29 NOTE — Assessment & Plan Note (Signed)
Encourage heart healthy diet such as MIND or DASH diet, increase exercise, avoid trans fats, simple carbohydrates and processed foods, consider a krill or fish or flaxseed oil cap daily.  °

## 2022-11-29 NOTE — Assessment & Plan Note (Addendum)
Patient encouraged to maintain heart healthy diet, regular exercise, adequate sleep. Consider daily probiotics. Take medications as prescribed. Labs ordered and reviewed. Colonoscopy done in 2021 repeat in 2026. Dexa scan 2022 repeat in 2-3  years. MGM 03/2022 repeat in next year. Pap last in mid to late 60s, Dr Marice Potter OB[facility] patient reports was normal and has aged out and does not wish to pursue further screenings.

## 2022-11-29 NOTE — Assessment & Plan Note (Signed)
Monitor and report any concerns, no changes to meds. Encouraged heart healthy diet such as the DASH diet and exercise as tolerated.  ?

## 2022-11-29 NOTE — Assessment & Plan Note (Signed)
Hydrate and monitor 

## 2022-11-30 ENCOUNTER — Ambulatory Visit (INDEPENDENT_AMBULATORY_CARE_PROVIDER_SITE_OTHER): Payer: No Typology Code available for payment source | Admitting: Family Medicine

## 2022-11-30 VITALS — BP 146/86 | HR 60 | Temp 98.0°F | Resp 16 | Ht 61.0 in | Wt 188.4 lb

## 2022-11-30 DIAGNOSIS — R350 Frequency of micturition: Secondary | ICD-10-CM | POA: Diagnosis not present

## 2022-11-30 DIAGNOSIS — Z79899 Other long term (current) drug therapy: Secondary | ICD-10-CM | POA: Diagnosis not present

## 2022-11-30 DIAGNOSIS — M5412 Radiculopathy, cervical region: Secondary | ICD-10-CM | POA: Diagnosis not present

## 2022-11-30 DIAGNOSIS — K5900 Constipation, unspecified: Secondary | ICD-10-CM | POA: Diagnosis not present

## 2022-11-30 DIAGNOSIS — E785 Hyperlipidemia, unspecified: Secondary | ICD-10-CM | POA: Diagnosis not present

## 2022-11-30 DIAGNOSIS — Z0000 Encounter for general adult medical examination without abnormal findings: Secondary | ICD-10-CM

## 2022-11-30 DIAGNOSIS — N289 Disorder of kidney and ureter, unspecified: Secondary | ICD-10-CM | POA: Diagnosis not present

## 2022-11-30 DIAGNOSIS — E739 Lactose intolerance, unspecified: Secondary | ICD-10-CM | POA: Diagnosis not present

## 2022-11-30 DIAGNOSIS — I10 Essential (primary) hypertension: Secondary | ICD-10-CM

## 2022-11-30 LAB — COMPREHENSIVE METABOLIC PANEL
ALT: 11 U/L (ref 0–35)
AST: 18 U/L (ref 0–37)
Albumin: 4.2 g/dL (ref 3.5–5.2)
Alkaline Phosphatase: 48 U/L (ref 39–117)
BUN: 33 mg/dL — ABNORMAL HIGH (ref 6–23)
CO2: 32 mEq/L (ref 19–32)
Calcium: 10.3 mg/dL (ref 8.4–10.5)
Chloride: 95 mEq/L — ABNORMAL LOW (ref 96–112)
Creatinine, Ser: 1.39 mg/dL — ABNORMAL HIGH (ref 0.40–1.20)
GFR: 37.4 mL/min — ABNORMAL LOW (ref >60.00)
Glucose, Bld: 93 mg/dL (ref 70–99)
Potassium: 3.6 mEq/L (ref 3.5–5.1)
Sodium: 136 mEq/L (ref 135–145)
Total Bilirubin: 0.4 mg/dL (ref 0.2–1.2)
Total Protein: 7 g/dL (ref 6.0–8.3)

## 2022-11-30 LAB — TSH: TSH: 1.4 u[IU]/mL (ref 0.35–5.50)

## 2022-11-30 LAB — LIPID PANEL
Cholesterol: 148 mg/dL (ref 0–200)
HDL: 42.1 mg/dL (ref >39.00)
LDL Cholesterol: 67 mg/dL (ref 0–99)
NonHDL: 106.28
Total CHOL/HDL Ratio: 4
Triglycerides: 195 mg/dL — ABNORMAL HIGH (ref 0.0–149.0)
VLDL: 39 mg/dL (ref 0.0–40.0)

## 2022-11-30 LAB — HEMOGLOBIN A1C: Hgb A1c MFr Bld: 5.9 % (ref 4.6–6.5)

## 2022-11-30 LAB — URINALYSIS, ROUTINE W REFLEX MICROSCOPIC
Bilirubin Urine: NEGATIVE
Hgb urine dipstick: NEGATIVE
Ketones, ur: NEGATIVE
Nitrite: NEGATIVE
Specific Gravity, Urine: 1.015 (ref 1.000–1.030)
Total Protein, Urine: NEGATIVE
Urine Glucose: NEGATIVE
Urobilinogen, UA: 0.2 (ref 0.0–1.0)
pH: 6.5 (ref 5.0–8.0)

## 2022-11-30 LAB — CBC WITH DIFFERENTIAL/PLATELET
Basophils Absolute: 0 10*3/uL (ref 0.0–0.1)
Basophils Relative: 0.4 % (ref 0.0–3.0)
Eosinophils Absolute: 0.1 10*3/uL (ref 0.0–0.7)
Eosinophils Relative: 0.6 % (ref 0.0–5.0)
HCT: 36.7 % (ref 36.0–46.0)
Hemoglobin: 11.9 g/dL — ABNORMAL LOW (ref 12.0–15.0)
Lymphocytes Relative: 26.7 % (ref 12.0–46.0)
Lymphs Abs: 2.7 10*3/uL (ref 0.7–4.0)
MCHC: 32.5 g/dL (ref 30.0–36.0)
MCV: 94.8 fl (ref 78.0–100.0)
Monocytes Absolute: 0.7 10*3/uL (ref 0.1–1.0)
Monocytes Relative: 6.7 % (ref 3.0–12.0)
Neutro Abs: 6.6 10*3/uL (ref 1.4–7.7)
Neutrophils Relative %: 65.6 % (ref 43.0–77.0)
Platelets: 372 10*3/uL (ref 150.0–400.0)
RBC: 3.87 Mil/uL (ref 3.87–5.11)
RDW: 15.9 % — ABNORMAL HIGH (ref 11.5–15.5)
WBC: 10 10*3/uL (ref 4.0–10.5)

## 2022-11-30 NOTE — Assessment & Plan Note (Signed)
Dulcolax caps 2 daily, Magnesium Citrate 1000 mg daily help move the bowels daily worse since starting Oxycodone.

## 2022-11-30 NOTE — Patient Instructions (Signed)
Encouraged increased hydration and fiber in diet. Daily probiotics. If bowels not moving can use MOM 2 tbls po in 4 oz of warm prune juice by mouth every 2-3 days. If no results then repeat in 4 hours with  Dulcolax suppository pr, may repeat again in 4 more hours as needed. Seek care if symptoms worsen. Consider daily Miralax and/or Dulcolax if symptoms persist.    Preventive Care 65 Years and Older, Female Preventive care refers to lifestyle choices and visits with your health care provider that can promote health and wellness. Preventive care visits are also called wellness exams. What can I expect for my preventive care visit? Counseling Your health care provider may ask you questions about your: Medical history, including: Past medical problems. Family medical history. Pregnancy and menstrual history. History of falls. Current health, including: Memory and ability to understand (cognition). Emotional well-being. Home life and relationship well-being. Sexual activity and sexual health. Lifestyle, including: Alcohol, nicotine or tobacco, and drug use. Access to firearms. Diet, exercise, and sleep habits. Work and work Astronomer. Sunscreen use. Safety issues such as seatbelt and bike helmet use. Physical exam Your health care provider will check your: Height and weight. These may be used to calculate your BMI (body mass index). BMI is a measurement that tells if you are at a healthy weight. Waist circumference. This measures the distance around your waistline. This measurement also tells if you are at a healthy weight and may help predict your risk of certain diseases, such as type 2 diabetes and high blood pressure. Heart rate and blood pressure. Body temperature. Skin for abnormal spots. What immunizations do I need?  Vaccines are usually given at various ages, according to a schedule. Your health care provider will recommend vaccines for you based on your age, medical history,  and lifestyle or other factors, such as travel or where you work. What tests do I need? Screening Your health care provider may recommend screening tests for certain conditions. This may include: Lipid and cholesterol levels. Hepatitis C test. Hepatitis B test. HIV (human immunodeficiency virus) test. STI (sexually transmitted infection) testing, if you are at risk. Lung cancer screening. Colorectal cancer screening. Diabetes screening. This is done by checking your blood sugar (glucose) after you have not eaten for a while (fasting). Mammogram. Talk with your health care provider about how often you should have regular mammograms. BRCA-related cancer screening. This may be done if you have a family history of breast, ovarian, tubal, or peritoneal cancers. Bone density scan. This is done to screen for osteoporosis. Talk with your health care provider about your test results, treatment options, and if necessary, the need for more tests. Follow these instructions at home: Eating and drinking  Eat a diet that includes fresh fruits and vegetables, whole grains, lean protein, and low-fat dairy products. Limit your intake of foods with high amounts of sugar, saturated fats, and salt. Take vitamin and mineral supplements as recommended by your health care provider. Do not drink alcohol if your health care provider tells you not to drink. If you drink alcohol: Limit how much you have to 0-1 drink a day. Know how much alcohol is in your drink. In the [place] one drink equals one 12 oz bottle of beer (355 mL), one 5 oz glass of wine (148 mL), or one 1 oz glass of hard liquor (44 mL). Lifestyle Brush your teeth every morning and night with fluoride toothpaste. Floss one time each day. Exercise for at least 30 minutes  5 or more days each week. Do not use any products that contain nicotine or tobacco. These products include cigarettes, chewing tobacco, and vaping devices, such as e-cigarettes. If you  need help quitting, ask your health care provider. Do not use drugs. If you are sexually active, practice safe sex. Use a condom or other form of protection in order to prevent STIs. Take aspirin only as told by your health care provider. Make sure that you understand how much to take and what form to take. Work with your health care provider to find out whether it is safe and beneficial for you to take aspirin daily. Ask your health care provider if you need to take a cholesterol-lowering medicine (statin). Find healthy ways to manage stress, such as: Meditation, yoga, or listening to music. Journaling. Talking to a trusted person. Spending time with friends and family. Minimize exposure to UV radiation to reduce your risk of skin cancer. Safety Always wear your seat belt while driving or riding in a vehicle. Do not drive: If you have been drinking alcohol. Do not ride with someone who has been drinking. When you are tired or distracted. While texting. If you have been using any mind-altering substances or drugs. Wear a helmet and other protective equipment during sports activities. If you have firearms in your house, make sure you follow all gun safety procedures. What's next? Visit your health care provider once a year for an annual wellness visit. Ask your health care provider how often you should have your eyes and teeth checked. Stay up to date on all vaccines. This information is not intended to replace advice given to you by your health care provider. Make sure you discuss any questions you have with your health care provider. Document Revised: 11/20/2020 Document Reviewed: 11/20/2020 Elsevier Patient Education  2024 [employer].

## 2022-12-01 ENCOUNTER — Other Ambulatory Visit: Payer: Self-pay | Admitting: Orthopedic Surgery

## 2022-12-01 ENCOUNTER — Other Ambulatory Visit: Payer: Self-pay | Admitting: Family

## 2022-12-01 ENCOUNTER — Encounter: Payer: Self-pay | Admitting: Orthopedic Surgery

## 2022-12-01 LAB — DRUG MONITORING PANEL 376104, URINE: Tramadol: NEGATIVE ng/mL (ref <100)

## 2022-12-01 MED ORDER — HYDROCODONE-ACETAMINOPHEN 5-325 MG PO TABS: 1.0000 | ORAL_TABLET | Freq: Three times a day (TID) | ORAL | 0 refills | Status: DC | PRN

## 2022-12-02 ENCOUNTER — Ambulatory Visit (HOSPITAL_BASED_OUTPATIENT_CLINIC_OR_DEPARTMENT_OTHER): Payer: No Typology Code available for payment source

## 2022-12-02 ENCOUNTER — Other Ambulatory Visit: Payer: Self-pay | Admitting: Family Medicine

## 2022-12-02 LAB — DRUG MONITORING PANEL 376104, URINE
Barbiturates: NEGATIVE ng/mL (ref <300)
Benzodiazepines: NEGATIVE ng/mL (ref <100)
Cocaine Metabolite: NEGATIVE ng/mL (ref <150)
Codeine: NEGATIVE ng/mL (ref <50)
Desmethyltramadol: NEGATIVE ng/mL (ref <100)
Hydrocodone: 167 ng/mL — ABNORMAL HIGH (ref <50)
Hydromorphone: 267 ng/mL — ABNORMAL HIGH (ref <50)
Morphine: NEGATIVE ng/mL (ref <50)
Norhydrocodone: 304 ng/mL — ABNORMAL HIGH (ref <50)
Noroxycodone: NEGATIVE ng/mL (ref <50)
Opiates: POSITIVE ng/mL — AB (ref <100)
Oxycodone: NEGATIVE ng/mL (ref <50)
Oxycodone: POSITIVE ng/mL — AB (ref <100)
Oxymorphone: 145 ng/mL — ABNORMAL HIGH (ref <50)
Tramadol: NEGATIVE ng/mL (ref <100)

## 2022-12-02 LAB — URINE CULTURE
MICRO NUMBER:: 15118681
SPECIMEN QUALITY:: ADEQUATE

## 2022-12-02 LAB — DM TEMPLATE

## 2022-12-02 MED ORDER — CEFDINIR 300 MG PO CAPS: 300.0000 mg | ORAL_CAPSULE | Freq: Two times a day (BID) | ORAL | 0 refills | Status: DC

## 2022-12-03 NOTE — Telephone Encounter (Signed)
Scheduled for the 10th  Patient aware likely will have to wait since this is a work in appt

## 2022-12-14 ENCOUNTER — Ambulatory Visit: Payer: No Typology Code available for payment source | Admitting: Orthopedic Surgery

## 2022-12-14 ENCOUNTER — Encounter: Payer: Self-pay | Admitting: Orthopedic Surgery

## 2022-12-14 DIAGNOSIS — M19011 Primary osteoarthritis, right shoulder: Secondary | ICD-10-CM | POA: Diagnosis not present

## 2022-12-14 DIAGNOSIS — M19012 Primary osteoarthritis, left shoulder: Secondary | ICD-10-CM | POA: Diagnosis not present

## 2022-12-14 MED ORDER — TRAMADOL HCL 50 MG PO TABS: 50.0000 mg | ORAL_TABLET | Freq: Two times a day (BID) | ORAL | 0 refills | Status: DC | PRN

## 2022-12-14 NOTE — Progress Notes (Signed)
c 

## 2022-12-15 ENCOUNTER — Telehealth: Payer: Self-pay | Admitting: Orthopedic Surgery

## 2022-12-15 ENCOUNTER — Encounter: Payer: Self-pay | Admitting: Orthopedic Surgery

## 2022-12-15 LAB — CBC WITH DIFFERENTIAL/PLATELET
Absolute Monocytes: 663 cells/uL (ref 200–950)
Basophils Absolute: 43 cells/uL (ref 0–200)
Basophils Relative: 0.4 %
Eosinophils Absolute: 118 cells/uL (ref 15–500)
Eosinophils Relative: 1.1 %
HCT: 37 % (ref 35.0–45.0)
Hemoglobin: 11.9 g/dL (ref 11.7–15.5)
Lymphs Abs: 3349 cells/uL (ref 850–3900)
MCH: 30.9 pg (ref 27.0–33.0)
MCHC: 32.2 g/dL (ref 32.0–36.0)
MCV: 96.1 fL (ref 80.0–100.0)
MPV: 10.8 fL (ref 7.5–12.5)
Monocytes Relative: 6.2 %
Neutro Abs: 6527 cells/uL (ref 1500–7800)
Neutrophils Relative %: 61 %
Platelets: 340 10*3/uL (ref 140–400)
RBC: 3.85 10*6/uL (ref 3.80–5.10)
RDW: 13.6 % (ref 11.0–15.0)
Total Lymphocyte: 31.3 %
WBC: 10.7 10*3/uL (ref 3.8–10.8)

## 2022-12-15 LAB — SEDIMENTATION RATE: Sed Rate: 60 mm/h — ABNORMAL HIGH (ref 0–30)

## 2022-12-15 LAB — C-REACTIVE PROTEIN: CRP: 9.3 mg/L — ABNORMAL HIGH (ref <8.0)

## 2022-12-15 NOTE — Progress Notes (Addendum)
Office Visit Note   Patient: [name]           Date of Birth: January 22, 1951           MRN: [id number] Visit Date: 12/14/2022 Requested by: Cammy Copa, MD [address] [place],  [place] [address] PCP: Bradd Canary, MD  Subjective: Chief Complaint  Patient presents with   Left Shoulder - Pain    HPI: [name] is a 72 y.o. female who presents to the office reporting bilateral shoulder pain left worse than right for the past 7 months.  Denies any history of injury.  Patient has had 4 steroid injections without relief.  She is right-hand dominant.  Reports constant and progressive and debilitating pain in that left shoulder.  Difficulty with ADLs.  She is retired from office work.  Cannot really take Norco or oxycodone but can only take Tylenol.  She states she occasionally has chills and thinks she may be getting an early UTI.  Urine culture from primary care was positive for E. coli and she was prescribed antibiotics.  Urinalysis was nitrate negative.  MRI scan of the left shoulder demonstrates large glenohumeral joint effusion with extensive full-thickness cartilage loss of the glenohumeral joint with severe subchondral reactive marrow edema.  Differential in this case was severe arthritis versus inflammatory or crystalline arthropathy with remote possibility of septic arthritis..                ROS: All systems reviewed are negative as they relate to the chief complaint within the history of present illness.  Patient denies fevers or chills.  Assessment & Plan: Visit Diagnoses:  1. Primary osteoarthritis of both shoulders     Plan: Impression is severe left shoulder pain with a lot of edema in the humeral head with large shoulder joint effusion.  Her course has been a little bit more rapid and progressive than normal glenohumeral joint arthritis.  Sed rate C-reactive protein and CBC DIF ordered today and both the sed rate and C-reactive protein were elevated.  She  does have possible urinary tract infection at this time as well and has not started antibiotics yet.  Plan at this time is that she likely needs to reverse shoulder replacement; however, we will need to definitively rule out infection in that joint prior to surgery.  Plan is ultrasound-guided aspiration of the shoulder joint and send that for Gram stain aerobic and anaerobic cultures to hold for at least 21 days to look for C acnes.  Thin cut CT scan for patient specific instrumentation.  With the use of models the risk and benefits of reverse shoulder replacement are discussed including not limited to infection or vessel damage instability as well as incomplete pain relief and possible incomplete restoration of functional range of motion for the shoulder.  Patient understands the risk and benefits and wishes to proceed.  Follow-Up Instructions: No follow-ups on file.   Orders:  Orders Placed This Encounter  Procedures   CT SHOULDER LEFT WO CONTRAST   CBC with Differential   Sed Rate (ESR)   C-reactive protein   Meds ordered this encounter  Medications   traMADol (ULTRAM) 50 MG tablet    Sig: Take 1 tablet (50 mg total) by mouth every 12 (twelve) hours as needed.    Dispense:  30 tablet    Refill:  0      Procedures: No procedures performed   Clinical Data: No additional findings.  Objective: Vital  Signs: There were no vitals taken for this visit.  Physical Exam:  Constitutional: Patient appears well-developed HEENT:  Head: Normocephalic Eyes:EOM are normal Neck: Normal range of motion Cardiovascular: Normal rate Pulmonary/chest: Effort normal Neurologic: Patient is alert Skin: Skin is warm Psychiatric: Patient has normal mood and affect  Ortho Exam: Ortho exam demonstrates painful passive and active range of motion of that left shoulder.  Right shoulder has forward flexion and abduction both above 90 degrees.  Patient does have some coarse grinding and crepitus in that  left shoulder.  No lymphadenopathy or warmth to the left shoulder versus right.  No discrete AC joint tenderness is present.  Neck range of motion is full.  Passive range of motion is 20/45/75.  We will plan for CT scan left shoulder and cut for preoperative RSA.  Tramadol also prescribed and labs drawn today.  At the time of this dictation labs were elevated so we will plan for left shoulder aspiration, she may or may not be on antibiotics at that time.  Specialty Comments:  No specialty comments available.  Imaging: No results found.   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   Transient neurologic deficit 08/18/2017   Obesity 02/08/2017   Palpitations 02/04/2017   Sleep apnea 10/01/2016   Diverticulosis 01/26/2014   Benign paroxysmal positional vertigo 11/14/2013   Urinary frequency 09/02/2013   Post-menopause 09/02/2013   H/O tobacco use, presenting hazards to health 09/02/2013   GERD (gastroesophageal reflux disease) 09/02/2013   Personal history of colonic polyps 09/02/2013   Preventative health care 11/28/2012   RHINITIS 06/27/2010   GLUCOSE INTOLERANCE 12/22/2007   Hyperlipidemia 12/20/2007   Essential hypertension, benign 12/20/2007   ARTHRITIS 12/20/2007   KNEE SPRAIN 12/20/2007   CARCINOMA, BASAL CELL, HX OF 12/20/2007   ARRHYTHMIA, HX OF 12/20/2007   GLAUCOMA, HX OF 12/20/2007   Past Medical History:  Diagnosis Date   Arthritis    Benign paroxysmal positional vertigo 11/14/2013   Colon polyp    Depression    Diverticulosis    Essential hypertension, benign    GERD (gastroesophageal reflux disease) 09/02/2013   GI bleed     Glaucoma    Glucose intolerance (impaired glucose tolerance)    H/O tobacco use, presenting hazards to health 09/02/2013   History  of basal cell carcinoma    History of cardiac arrhythmia    Hyperlipidemia    Hypertension    Medicare welcome visit 11/28/2012   Obesity 02/08/2017   Palpitations 02/04/2017   Personal history of colonic polyps 09/02/2013   Patient reports H/O 2 colonoscopies, last one dates back to 2012 in [place] She reports they were labeled as incomplete.  Reports 1st colonoscopy had 1 benign polyp and the 2nd was clear.    Renal insufficiency 04/28/2021   Sleep apnea 10/01/2016   Not on CPAP machine    Stroke [facility])    UTI (urinary tract infection)     Family History  Problem Relation Age of Onset   Coronary artery disease Paternal Grandmother    Hyperlipidemia Paternal Grandmother    Hypertension Paternal Grandmother    Stroke Paternal Grandmother    Hypertension Mother  Lung cancer Mother        smoker   Uterine cancer Mother    Birth defects Mother        lung cancer in smoker   COPD Mother    Hypertension Father    Kidney disease Father        Renal Cell Carcinoma   Birth defects Father        renal cell CA, smoker   Hypertension Maternal Grandmother    Hypertension Maternal Grandfather    Hypertension Paternal Grandfather    Dementia Paternal Grandfather    Cancer Brother        testicular   Heart disease Brother        NI, smoker   Hyperlipidemia Other    Colon cancer Neg Hx    Esophageal cancer Neg Hx     Past Surgical History:  Procedure Laterality Date   CAROTID STENT     CERVICAL CONE BIOPSY  1960s   COLONOSCOPY  2016   DILATION AND CURETTAGE OF UTERUS  1966   KNEE SURGERY Left 11/01/2008   left knee total replacement Dr Lajoyce Corners   WISDOM TOOTH EXTRACTION     Social History   Occupational History   Occupation: Retired   Tobacco Use   Smoking status: Former    Packs/day: 1.00    Years: 15.00    Additional pack years: 0.00    Total pack  years: 15.00    Types: Cigarettes    Quit date: 06/08/1982    Years since quitting: 40.5   Smokeless tobacco: Never  Vaping Use   Vaping Use: Never used  Substance and Sexual Activity   Alcohol use: Yes    Comment: 1-2 glasses of wine per day   Drug use: No   Sexual activity: Not Currently    Birth control/protection: Post-menopausal    Comment: lives alone, no dietary restrictions except no lactose,       Pls set her up for left shoulder aspiration under ultrasound guidance with interventional radiology and send cultures to lab for aerobic and anaerobic culture and Gram stain and they need to hold that for 21 days looking for C acnes.  Thanks  This could also be early UTI but I think we need to aspirate that shoulder before replacing it.  Thanks

## 2022-12-15 NOTE — Telephone Encounter (Signed)
FYI.Marland KitchenMarland KitchenMarland KitchenMarland KitchenAdvised pt we got her message about CT and we are working on it

## 2022-12-15 NOTE — Telephone Encounter (Signed)
Patient called advised she wanted to go to [facility] on [address] in [place]  [place]. The number to contact patient is [phone]

## 2022-12-16 ENCOUNTER — Ambulatory Visit: Payer: No Typology Code available for payment source | Admitting: Orthopedic Surgery

## 2022-12-16 ENCOUNTER — Other Ambulatory Visit: Payer: Self-pay | Admitting: Orthopedic Surgery

## 2022-12-16 DIAGNOSIS — M7542 Impingement syndrome of left shoulder: Secondary | ICD-10-CM

## 2022-12-16 DIAGNOSIS — G8929 Other chronic pain: Secondary | ICD-10-CM

## 2022-12-16 DIAGNOSIS — M19011 Primary osteoarthritis, right shoulder: Secondary | ICD-10-CM

## 2022-12-17 NOTE — Telephone Encounter (Signed)
Pt has been scheduled at [facility] per her request

## 2022-12-18 ENCOUNTER — Ambulatory Visit (HOSPITAL_BASED_OUTPATIENT_CLINIC_OR_DEPARTMENT_OTHER)
Admission: RE | Admit: 2022-12-18 | Discharge: 2022-12-18 | Disposition: A | Discharge status: Home / Self Care | Payer: No Typology Code available for payment source | Source: Ambulatory Visit | Attending: Orthopedic Surgery | Admitting: Orthopedic Surgery

## 2022-12-18 DIAGNOSIS — M19012 Primary osteoarthritis, left shoulder: Secondary | ICD-10-CM | POA: Insufficient documentation

## 2022-12-18 DIAGNOSIS — M47812 Spondylosis without myelopathy or radiculopathy, cervical region: Secondary | ICD-10-CM | POA: Diagnosis not present

## 2022-12-18 DIAGNOSIS — M19011 Primary osteoarthritis, right shoulder: Secondary | ICD-10-CM | POA: Insufficient documentation

## 2022-12-18 DIAGNOSIS — M2540 Effusion, unspecified joint: Secondary | ICD-10-CM | POA: Diagnosis not present

## 2022-12-18 DIAGNOSIS — Z01818 Encounter for other preprocedural examination: Secondary | ICD-10-CM | POA: Diagnosis not present

## 2022-12-22 ENCOUNTER — Encounter: Payer: Self-pay | Admitting: Orthopedic Surgery

## 2022-12-23 ENCOUNTER — Encounter: Payer: Self-pay | Admitting: Orthopedic Surgery

## 2022-12-24 ENCOUNTER — Ambulatory Visit
Admission: RE | Admit: 2022-12-24 | Discharge: 2022-12-24 | Disposition: A | Discharge status: Home / Self Care | Payer: No Typology Code available for payment source | Source: Ambulatory Visit | Attending: Orthopedic Surgery | Admitting: Orthopedic Surgery

## 2022-12-24 ENCOUNTER — Ambulatory Visit: Payer: No Typology Code available for payment source | Admitting: Orthopedic Surgery

## 2022-12-24 DIAGNOSIS — G8929 Other chronic pain: Secondary | ICD-10-CM

## 2022-12-24 DIAGNOSIS — M19011 Primary osteoarthritis, right shoulder: Secondary | ICD-10-CM

## 2022-12-24 DIAGNOSIS — M25412 Effusion, left shoulder: Secondary | ICD-10-CM | POA: Diagnosis not present

## 2022-12-24 DIAGNOSIS — M7542 Impingement syndrome of left shoulder: Secondary | ICD-10-CM

## 2022-12-24 DIAGNOSIS — M25512 Pain in left shoulder: Secondary | ICD-10-CM | POA: Diagnosis not present

## 2022-12-25 ENCOUNTER — Ambulatory Visit: Payer: Self-pay | Admitting: Orthopedic Surgery

## 2022-12-25 LAB — ANAEROBIC AND AEROBIC CULTURE
AER RESULT:: NO GROWTH
SPECIMEN QUALITY:: ADEQUATE

## 2022-12-25 NOTE — Progress Notes (Signed)
Can you give me surg sheet mon to schedule thx

## 2022-12-28 ENCOUNTER — Telehealth: Payer: Self-pay

## 2022-12-28 DIAGNOSIS — N183 Chronic kidney disease, stage 3 unspecified: Secondary | ICD-10-CM | POA: Diagnosis not present

## 2022-12-28 DIAGNOSIS — I129 Hypertensive chronic kidney disease with stage 1 through stage 4 chronic kidney disease, or unspecified chronic kidney disease: Secondary | ICD-10-CM | POA: Diagnosis not present

## 2022-12-28 LAB — ANAEROBIC AND AEROBIC CULTURE
AER RESULT:: NO GROWTH
MICRO NUMBER:: 15217402
MICRO NUMBER:: 15217403

## 2022-12-28 NOTE — Telephone Encounter (Signed)
Reminder to complete surgery sheet.

## 2022-12-28 NOTE — Telephone Encounter (Signed)
-----   Message from Burnard Bunting sent at 12/25/2022  5:05 PM EDT ----- Can you give me surg sheet mon to schedule thx

## 2023-01-01 ENCOUNTER — Ambulatory Visit: Payer: No Typology Code available for payment source | Admitting: Orthopedic Surgery

## 2023-01-01 ENCOUNTER — Encounter: Payer: Self-pay | Admitting: Orthopedic Surgery

## 2023-01-01 ENCOUNTER — Telehealth: Payer: Self-pay

## 2023-01-01 DIAGNOSIS — M19012 Primary osteoarthritis, left shoulder: Secondary | ICD-10-CM

## 2023-01-01 DIAGNOSIS — M19011 Primary osteoarthritis, right shoulder: Secondary | ICD-10-CM | POA: Diagnosis not present

## 2023-01-01 NOTE — Telephone Encounter (Signed)
Can you please provide me with contact name and number at facility where patient had aspiration? Dr August Saucer wants to know if lab keeping culture long enough for c.acnes growth as requested in order.

## 2023-01-01 NOTE — Progress Notes (Signed)
Office Visit Note   Patient: [name]           Date of Birth: 12-Feb-1951           MRN: [id number] Visit Date: 01/01/2023 Requested by: Bradd Canary, MD [address] [place],  [place] [address] PCP: Bradd Canary, MD  Subjective: Chief Complaint  Patient presents with   Left Shoulder - Follow-up    HPI: [name] is a 72 y.o. female who presents to the office reporting left shoulder pain.  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  CT scan reviewed today confirms bone-on-bone arthritis.  Patient has not had prior shoulder surgery.  Aspiration of the shoulder has been performed and cultures are negative to date.  Patient did have elevated sed rate and C-reactive protein but no urinary tract infection.  Patient has right shoulder arthritis as well..                ROS: All systems reviewed are negative as they relate to the chief complaint within the history of present illness.  Patient denies fevers or chills.  Assessment & Plan: Visit Diagnoses:  1. Primary osteoarthritis of both shoulders     Plan: Impression is end-stage left shoulder arthritis.  Cultures negative.  Plan to redraw CBC DIF sed rate C-reactive protein in 2 weeks to evaluate trend.  Shoulder itself does not appear infected but the MRI scan was suggestive of progressive arthritis.  Risk and benefits of reverse shoulder replacement are discussed with the patient including not limited to infection or vessel damage incomplete pain relief as well as incomplete functional restoration.  Patient understands risk and benefits.  All questions answered.  Follow-Up Instructions: No follow-ups on file.   Orders:  No orders of the defined types were placed in this encounter.  No orders of the defined types were placed in this encounter.     Procedures: No procedures performed   Clinical Data: No additional findings.  Objective: Vital Signs: There were no vitals  taken for this visit.  Physical Exam:  Constitutional: Patient appears well-developed HEENT:  Head: Normocephalic Eyes:EOM are normal Neck: Normal range of motion Cardiovascular: Normal rate Pulmonary/chest: Effort normal Neurologic: Patient is alert Skin: Skin is warm Psychiatric: Patient has normal mood and affect  Ortho Exam: Ortho exam demonstrates functional deltoid.  Diminished and painful range of motion is present on the left-hand side with intact subscap strength.  Shoulder itself is not warm compared to the right-hand side and there is no lymphadenopathy.  Cervical spine range of motion intact.  Specialty Comments:  No specialty comments available.  Imaging: No results found.   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   Transient neurologic deficit 08/18/2017   Obesity 02/08/2017   Palpitations 02/04/2017   Sleep apnea 10/01/2016   Diverticulosis 01/26/2014   Benign paroxysmal positional vertigo 11/14/2013   Urinary frequency 09/02/2013   Post-menopause 09/02/2013   H/O tobacco use, presenting hazards to health 09/02/2013   GERD (gastroesophageal reflux disease) 09/02/2013   Personal history of colonic polyps 09/02/2013   Preventative health care 11/28/2012  RHINITIS 06/27/2010   GLUCOSE INTOLERANCE 12/22/2007   Hyperlipidemia 12/20/2007   Essential hypertension, benign 12/20/2007   ARTHRITIS 12/20/2007   KNEE SPRAIN 12/20/2007   CARCINOMA, BASAL CELL, HX OF 12/20/2007   ARRHYTHMIA, HX OF 12/20/2007   GLAUCOMA, HX OF 12/20/2007   Past Medical History:  Diagnosis Date   Arthritis    Benign paroxysmal positional  vertigo 11/14/2013   Colon polyp    Depression    Diverticulosis    Essential hypertension, benign    GERD (gastroesophageal reflux disease) 09/02/2013   GI bleed    Glaucoma    Glucose intolerance (impaired glucose tolerance)    H/O tobacco use, presenting hazards to health 09/02/2013   History  of basal cell carcinoma    History of cardiac arrhythmia    Hyperlipidemia    Hypertension    Medicare welcome visit 11/28/2012   Obesity 02/08/2017   Palpitations 02/04/2017   Personal history of colonic polyps 09/02/2013   Patient reports H/O 2 colonoscopies, last one dates back to 2012 in [place] She reports they were labeled as incomplete.  Reports 1st colonoscopy had 1 benign polyp and the 2nd was clear.    Renal insufficiency 04/28/2021   Sleep apnea 10/01/2016   Not on CPAP machine    Stroke [facility])    UTI (urinary tract infection)     Family History  Problem Relation Age of Onset   Coronary artery disease Paternal Grandmother    Hyperlipidemia Paternal Grandmother    Hypertension Paternal Grandmother    Stroke Paternal Grandmother    Hypertension Mother    Lung cancer Mother        smoker   Uterine cancer Mother    Birth defects Mother        lung cancer in smoker   COPD Mother    Hypertension Father    Kidney disease Father        Renal Cell Carcinoma   Birth defects Father        renal cell CA, smoker   Hypertension Maternal Grandmother    Hypertension Maternal Grandfather    Hypertension Paternal Grandfather    Dementia Paternal Grandfather    Cancer Brother        testicular   Heart disease Brother        NI, smoker   Hyperlipidemia Other    Colon cancer Neg Hx    Esophageal cancer Neg Hx     Past Surgical History:  Procedure Laterality Date   CAROTID STENT     CERVICAL CONE BIOPSY  1960s   COLONOSCOPY  2016   DILATION AND CURETTAGE OF UTERUS  1966   KNEE SURGERY Left 11/01/2008   left knee total replacement Dr Lajoyce Corners   WISDOM TOOTH EXTRACTION     Social History    Occupational History   Occupation: Retired   Tobacco Use   Smoking status: Former    Current packs/day: 0.00    Average packs/day: 1 pack/day for 15.0 years (15.0 ttl pk-yrs)    Types: Cigarettes    Start date: 06/09/1967    Quit date: 06/08/1982    Years since quitting: 40.5   Smokeless tobacco: Never  Vaping Use   Vaping status: Never Used  Substance and Sexual Activity   Alcohol use: Yes    Comment: 1-2 glasses of wine per day   Drug use: No   Sexual activity: Not Currently    Birth control/protection: Post-menopausal    Comment: lives alone, no dietary restrictions  except no lactose,

## 2023-01-04 ENCOUNTER — Telehealth: Payer: Self-pay | Admitting: Orthopedic Surgery

## 2023-01-04 ENCOUNTER — Telehealth: Payer: Self-pay

## 2023-01-04 NOTE — Telephone Encounter (Signed)
-----   Message from Burnard Bunting sent at 01/01/2023  6:44 PM EDT ----- [name].  Can you have this person come back in 2 weeks to redraw sed rate and C-reactive protein and CBC DIF.  Thanks

## 2023-01-04 NOTE — Telephone Encounter (Signed)
Can you please call and schedule nurse visit for labs only in 2 weeks?

## 2023-01-04 NOTE — Telephone Encounter (Signed)
Patient called asked for a call back concerning the order for labs. Patient want to know what lab work is needed from her.  Patient said she is having pre-op and lab work 01/13/2023.  The number to contact patient is [phone]

## 2023-01-05 DIAGNOSIS — M898X9 Other specified disorders of bone, unspecified site: Secondary | ICD-10-CM | POA: Diagnosis not present

## 2023-01-05 DIAGNOSIS — I129 Hypertensive chronic kidney disease with stage 1 through stage 4 chronic kidney disease, or unspecified chronic kidney disease: Secondary | ICD-10-CM | POA: Diagnosis not present

## 2023-01-05 DIAGNOSIS — D518 Other vitamin B12 deficiency anemias: Secondary | ICD-10-CM | POA: Diagnosis not present

## 2023-01-05 DIAGNOSIS — N390 Urinary tract infection, site not specified: Secondary | ICD-10-CM | POA: Diagnosis not present

## 2023-01-05 DIAGNOSIS — N183 Chronic kidney disease, stage 3 unspecified: Secondary | ICD-10-CM | POA: Diagnosis not present

## 2023-01-05 DIAGNOSIS — E559 Vitamin D deficiency, unspecified: Secondary | ICD-10-CM | POA: Diagnosis not present

## 2023-01-05 DIAGNOSIS — B9620 Unspecified Escherichia coli [E. coli] as the cause of diseases classified elsewhere: Secondary | ICD-10-CM | POA: Diagnosis not present

## 2023-01-05 NOTE — Telephone Encounter (Signed)
Lab order states to hold for c acnes

## 2023-01-07 ENCOUNTER — Other Ambulatory Visit: Payer: Self-pay

## 2023-01-07 NOTE — Telephone Encounter (Signed)
I called she is coming at 31

## 2023-01-08 ENCOUNTER — Ambulatory Visit (INDEPENDENT_AMBULATORY_CARE_PROVIDER_SITE_OTHER): Payer: No Typology Code available for payment source

## 2023-01-08 DIAGNOSIS — G8929 Other chronic pain: Secondary | ICD-10-CM | POA: Diagnosis not present

## 2023-01-08 DIAGNOSIS — M25512 Pain in left shoulder: Secondary | ICD-10-CM

## 2023-01-08 NOTE — Progress Notes (Signed)
Patient is here for lab work only

## 2023-01-11 ENCOUNTER — Encounter: Payer: Self-pay | Admitting: Orthopedic Surgery

## 2023-01-11 NOTE — Telephone Encounter (Signed)
Hi [name].  [name] has shoulder replacement scheduled for 2 weeks from now.  Her sed rate and C-reactive protein were elevated when I saw her last 2 weeks ago and I repeated them and they have increased.  What are your thoughts on what could be causing this increase in her inflammatory markers?  Right now her surgery is still on but I am not sure is a great idea unless I see a trend going down.  Right now I have 2 data points and the trend is going up.  Would you mind looking at her chart?  Thanks

## 2023-01-11 NOTE — Progress Notes (Signed)
Hi Lauren can you call this patient and tell her that I reached out to her primary care provider to see if she had any thoughts on why her inflammatory markers were going up.  Thanks

## 2023-01-12 ENCOUNTER — Other Ambulatory Visit: Payer: Self-pay | Admitting: Family Medicine

## 2023-01-12 ENCOUNTER — Other Ambulatory Visit: Payer: Self-pay

## 2023-01-12 DIAGNOSIS — R7982 Elevated C-reactive protein (CRP): Secondary | ICD-10-CM

## 2023-01-12 DIAGNOSIS — R350 Frequency of micturition: Secondary | ICD-10-CM

## 2023-01-12 DIAGNOSIS — I10 Essential (primary) hypertension: Secondary | ICD-10-CM

## 2023-01-12 NOTE — Telephone Encounter (Signed)
Called pt was advised of needing lab appt, alb appt scheduled and  Labs ordered.

## 2023-01-12 NOTE — Progress Notes (Signed)
Surgical Instructions    Your procedure is scheduled on Thursday August 22nd.  Report to [facility] Main Entrance "A" at 9:30 A.M., then check in with the Admitting office.  Call this number if you have problems the morning of surgery:  [phone]   If you have any questions prior to your surgery date call [phone]: Open Monday-Friday 8am-4pm If you experience any cold or flu symptoms such as cough, fever, chills, shortness of breath, etc. between now and your scheduled surgery, please notify us at the above number     Remember:  Do not eat after midnight the night before your surgery  You may drink clear liquids until 8:30am the morning of your surgery.   Clear liquids allowed are: Water, Non-Citrus Juices (without pulp), Carbonated Beverages, Clear Tea, Black Coffee ONLY (NO MILK, CREAM OR POWDERED CREAMER of any kind), and Gatorade  Enhanced Recovery after Surgery for Orthopedics Enhanced Recovery after Surgery is a protocol used to improve the stress on your body and your recovery after surgery.  Patient Instructions  The day of surgery (if you do NOT have diabetes):  Drink ONE (1) Pre-Surgery Clear Ensure by __8:30___ am the morning of surgery   This drink was given to you during your hospital  pre-op appointment visit. Nothing else to drink after completing the  Pre-Surgery Clear Ensure.         If you have questions, please contact your surgeon's office.    Take these medicines the morning of surgery with A SIP OF WATER: metoprolol tartrate (LOPRESSOR) 100 MG tablet   IF NEEDED  acetaminophen (TYLENOL) 650 MG CR tablet  Oxymetazoline HCl (NASAL SPRAY) 0.05 % SOLN  traMADol (ULTRAM) 50 MG tablet     Follow your surgeon's instructions on when to stop Aspirin.  If no instructions were given by your surgeon then you will need to call the office to get those instructions.     As of today, STOP taking any Aspirin (unless otherwise instructed by your surgeon) Aleve,  Naproxen, Ibuprofen, Motrin, Advil, Goody's, BC's, all herbal medications, fish oil, and all vitamins.  DAY OF SURGERY        Do not wear jewelry or makeup. Do not wear lotions, powders, perfumes/cologne or deodorant. Do not shave 48 hours prior to surgery.  Men may shave face and neck. Do not bring valuables to the hospital. Do not wear nail polish, gel polish, artificial nails, or any other type of covering on natural nails (fingers and toes) If you have artificial nails or gel coating that need to be removed by a nail salon, please have this removed prior to surgery. Artificial nails or gel coating may interfere with anesthesia's ability to adequately monitor your vital signs.  [facility] is not responsible for any belongings or valuables.    Do NOT Smoke (Tobacco/Vaping)  24 hours prior to your procedure  If you use a CPAP at night, you may bring your mask for your overnight stay.   Contacts, glasses, hearing aids, dentures or partials may not be worn into surgery, please bring cases for these belongings   For patients admitted to the hospital, discharge time will be determined by your treatment team.   Patients discharged the day of surgery will not be allowed to drive home, and someone needs to stay with them for 24 hours.   SURGICAL WAITING ROOM VISITATION Patients having surgery or a procedure may have no more than 2 support people in the waiting area - these  visitors may rotate.   Children under the age of 26 must have an adult with them who is not the patient. If the patient needs to stay at the hospital during part of their recovery, the visitor guidelines for inpatient rooms apply. Pre-op nurse will coordinate an appropriate time for 1 support person to accompany patient in pre-op.  This support person may not rotate.   Please refer to [email or web address] for the visitor guidelines for Inpatients (after your surgery is over  and you are in a regular room).    Special instructions:    Oral Hygiene is also important to reduce your risk of infection.  Remember - BRUSH YOUR TEETH THE MORNING OF SURGERY WITH YOUR REGULAR TOOTHPASTE    Pre-operative 5 CHG Bath Instructions   You can play a key role in reducing the risk of infection after surgery. Your skin needs to be as free of germs as possible. You can reduce the number of germs on your skin by washing with CHG (chlorhexidine gluconate) soap before surgery. CHG is an antiseptic soap that kills germs and continues to kill germs even after washing.   DO NOT use if you have an allergy to chlorhexidine/CHG or antibacterial soaps. If your skin becomes reddened or irritated, stop using the CHG and notify one of our RNs at [phone].   Please shower with the CHG soap starting 4 days before surgery using the following schedule:     Please keep in mind the following:  DO NOT shave, including legs and underarms, starting the day of your first shower.   You may shave your face at any point before/day of surgery.  Place clean sheets on your bed the day you start using CHG soap. Use a clean washcloth (not used since being washed) for each shower. DO NOT sleep with pets once you start using the CHG.   CHG Shower Instructions:  If you choose to wash your hair and private area, wash first with your normal shampoo/soap.  After you use shampoo/soap, rinse your hair and body thoroughly to remove shampoo/soap residue.  Turn the water OFF and apply about 3 tablespoons (45 ml) of CHG soap to a CLEAN washcloth.  Apply CHG soap ONLY FROM YOUR NECK DOWN TO YOUR TOES (washing for 3-5 minutes)  DO NOT use CHG soap on face, private areas, open wounds, or sores.  Pay special attention to the area where your surgery is being performed.  If you are having back surgery, having someone wash your back for you may be helpful. Wait 2 minutes after CHG soap is applied, then you may rinse  off the CHG soap.  Pat dry with a clean towel  Put on clean clothes/pajamas   If you choose to wear lotion, please use ONLY the CHG-compatible lotions on the back of this paper.     Additional instructions for the day of surgery: DO NOT APPLY any lotions, deodorants, cologne, or perfumes.   Put on clean/comfortable clothes.  Brush your teeth.  Ask your nurse before applying any prescription medications to the skin.      CHG Compatible Lotions   Aveeno Moisturizing lotion  Cetaphil Moisturizing Cream  Cetaphil Moisturizing Lotion  Clairol Herbal Essence Moisturizing Lotion, Dry Skin  Clairol Herbal Essence Moisturizing Lotion, Extra Dry Skin  Clairol Herbal Essence Moisturizing Lotion, Normal Skin  Curel Age Defying Therapeutic Moisturizing Lotion with Alpha Hydroxy  Curel Extreme Care Body Lotion  Curel Soothing Hands Moisturizing Hand Lotion  Curel Therapeutic Moisturizing  Cream, Fragrance-Free  Curel Therapeutic Moisturizing Lotion, Fragrance-Free  Curel Therapeutic Moisturizing Lotion, Original Formula  Eucerin Daily Replenishing Lotion  Eucerin Dry Skin Therapy Plus Alpha Hydroxy Crme  Eucerin Dry Skin Therapy Plus Alpha Hydroxy Lotion  Eucerin Original Crme  Eucerin Original Lotion  Eucerin Plus Crme Eucerin Plus Lotion  Eucerin TriLipid Replenishing Lotion  Keri Anti-Bacterial Hand Lotion  Keri Deep Conditioning Original Lotion Dry Skin Formula Softly Scented  Keri Deep Conditioning Original Lotion, Fragrance Free Sensitive Skin Formula  Keri Lotion Fast Absorbing Fragrance Free Sensitive Skin Formula  Keri Lotion Fast Absorbing Softly Scented Dry Skin Formula  Keri Original Lotion  Keri Skin Renewal Lotion Keri Silky Smooth Lotion  Keri Silky Smooth Sensitive Skin Lotion  Nivea Body Creamy Conditioning Oil  Nivea Body Extra Enriched Teacher, adult education Moisturizing Lotion Nivea Crme  Nivea Skin Firming Lotion  NutraDerm 30  Skin Lotion  NutraDerm Skin Lotion  NutraDerm Therapeutic Skin Cream  NutraDerm Therapeutic Skin Lotion  ProShield Protective Hand Cream  Provon moisturizing lotion  [facility]- Preparing for Shoulder Surgery  ?  Before surgery, you can play an important role. Because skin is not sterile, your skin needs to be as free of germs as possible. You can reduce the number of germs on your skin by using the following products.   1). Benzoyl Peroxide Gel: reduces the number of germs present on the skin   *Applied twice a day to shoulder area starting two days before surgery     2). Chlorhexidine Gluconate (CHG) Soap: An antiseptic cleaner that kills germs and bonds with the skin to continue killing germs even after washing   *Used for showering the night before surgery and morning of surgery   ?    Please follow these instructions carefully:     1). BENZOYL PEROXIDE 5% GEL (Please do not use if you have an allergy to benzoyl peroxide.   If your skin becomes reddened/irritated stop using the benzoyl peroxide)     Starting TWO DAYS BEFORE surgery:    Apply benzoyl peroxide in the morning and at night. Apply after taking a shower. If you are not taking a shower clean entire shoulder front, back, and side along with the armpit with a clean wet washcloth.     Place a quarter-sized amount of gel on your shoulder and rub in thoroughly, making sure to cover the front, back, and side of your shoulder, along with the armpit.                           Do this twice a day for two days.  (Last application is the night before surgery, AFTER using the CHG soap as described below).   Do NOT apply benzoyl peroxide gel on the day of surgery.   2 days before ____ AM   ____ PM              1 day before ____ AM   ____ PM       If you received a COVID test during your pre-op visit, it is requested that you wear a mask when out in public, stay away from anyone that may not be feeling well, and  notify your surgeon if you develop symptoms. If you have been in contact with anyone that has tested positive in the last 10 days, please notify your surgeon.    Please read over the following  fact sheets that you were given.

## 2023-01-12 NOTE — Telephone Encounter (Signed)
Thx Misty Stanley!

## 2023-01-12 NOTE — Progress Notes (Signed)
I sent her message through MyChart

## 2023-01-12 NOTE — Telephone Encounter (Signed)
Okay thanks for update

## 2023-01-13 ENCOUNTER — Other Ambulatory Visit: Payer: Self-pay

## 2023-01-13 ENCOUNTER — Encounter (HOSPITAL_COMMUNITY): Payer: Self-pay

## 2023-01-13 ENCOUNTER — Other Ambulatory Visit (INDEPENDENT_AMBULATORY_CARE_PROVIDER_SITE_OTHER): Payer: No Typology Code available for payment source

## 2023-01-13 ENCOUNTER — Encounter (HOSPITAL_COMMUNITY)
Admission: RE | Admit: 2023-01-13 | Discharge: 2023-01-13 | Disposition: A | Discharge status: Home / Self Care | Payer: No Typology Code available for payment source | Source: Ambulatory Visit | Attending: Internal Medicine | Admitting: Internal Medicine

## 2023-01-13 ENCOUNTER — Telehealth: Payer: Self-pay | Admitting: *Deleted

## 2023-01-13 VITALS — BP 127/61 | HR 72 | Temp 97.7°F | Resp 18 | Ht 61.0 in | Wt 186.3 lb

## 2023-01-13 DIAGNOSIS — Z01810 Encounter for preprocedural cardiovascular examination: Secondary | ICD-10-CM | POA: Insufficient documentation

## 2023-01-13 DIAGNOSIS — R350 Frequency of micturition: Secondary | ICD-10-CM | POA: Diagnosis not present

## 2023-01-13 DIAGNOSIS — R7982 Elevated C-reactive protein (CRP): Secondary | ICD-10-CM | POA: Diagnosis not present

## 2023-01-13 DIAGNOSIS — Z01812 Encounter for preprocedural laboratory examination: Secondary | ICD-10-CM | POA: Diagnosis not present

## 2023-01-13 DIAGNOSIS — Z01818 Encounter for other preprocedural examination: Secondary | ICD-10-CM

## 2023-01-13 HISTORY — DX: Cardiac arrhythmia, unspecified: I49.9

## 2023-01-13 HISTORY — DX: Anemia, unspecified: D64.9

## 2023-01-13 LAB — BASIC METABOLIC PANEL
Anion gap: 12 (ref 5–15)
BUN: 25 mg/dL — ABNORMAL HIGH (ref 8–23)
CO2: 25 mmol/L (ref 22–32)
Calcium: 9.8 mg/dL (ref 8.9–10.3)
Chloride: 99 mmol/L (ref 98–111)
Creatinine, Ser: 1.3 mg/dL — ABNORMAL HIGH (ref 0.44–1.00)
GFR, Estimated: 43 mL/min — ABNORMAL LOW (ref >60)
Glucose, Bld: 109 mg/dL — ABNORMAL HIGH (ref 70–99)
Potassium: 3.8 mmol/L (ref 3.5–5.1)
Sodium: 136 mmol/L (ref 135–145)

## 2023-01-13 LAB — SURGICAL PCR SCREEN
MRSA, PCR: NEGATIVE
Staphylococcus aureus: POSITIVE — AB

## 2023-01-13 LAB — TYPE AND SCREEN
ABO/RH(D): O POS
Antibody Screen: NEGATIVE

## 2023-01-13 NOTE — Telephone Encounter (Signed)
CRP more reflective of acute issues, likely won't contribute as much as the sed rate. Ty.

## 2023-01-13 NOTE — Telephone Encounter (Signed)
Dr Carmelia Roller-  Please review below and advise in PCP's absence if possible.  [name] came in for lab visit today.  She is wanting to know why we aren't repeating the CRP level and wants to know if that should be rechecked at today's visit.  Please advise?

## 2023-01-13 NOTE — Progress Notes (Addendum)
PCP - Maricela Curet Cardiologist - Karel Jarvis Folk,MD Nephrologist - Fredric Dine  PPM/ICD - denies Device Orders -  Rep Notified -   Chest x-ray -  EKG - 01/13/23 Stress Test - 03/23/07 ECHO - 08/19/17 Cardiac Cath - denies  Sleep Study - home study-2023 CPAP - no  Fasting Blood Sugar - NA Checks Blood Sugar _____ times a day  Last dose of GLP1 agonist-  na GLP1 instructions:   Blood Thinner Instructions:na Aspirin Instructions:pt agrees to call Dr. Diamantina Providence office for instructions regarding when to stop aspirin.  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 She is following up with PCP today.Pt had BMP and T&S drawn at PAT today. Per Dr. Diamantina Providence orders, pt is to have CBC diff,sed rate, CK drawn at PCP's office today. UA and culture will be performed there as well. CBC and UA not done at PAT;discussed with Cyndia Diver RN.  Patient denies shortness of breath, fever, cough and chest pain at PAT appointment   All instructions explained to the patient, with a verbal understanding of the material. Patient agrees to go over the instructions while at home for a better understanding. Patient also instructed to wear a mask when out in public prior to surgery. The opportunity to ask questions was provided.

## 2023-01-14 ENCOUNTER — Other Ambulatory Visit: Payer: No Typology Code available for payment source

## 2023-01-14 ENCOUNTER — Other Ambulatory Visit: Payer: Self-pay | Admitting: *Deleted

## 2023-01-14 ENCOUNTER — Ambulatory Visit (INDEPENDENT_AMBULATORY_CARE_PROVIDER_SITE_OTHER): Payer: No Typology Code available for payment source

## 2023-01-14 DIAGNOSIS — R7982 Elevated C-reactive protein (CRP): Secondary | ICD-10-CM

## 2023-01-14 LAB — C-REACTIVE PROTEIN: CRP: 2.2 mg/dL (ref 0.5–20.0)

## 2023-01-14 NOTE — Addendum Note (Signed)
Addended by: Mervin Kung A on: 01/14/2023 10:10 AM   Modules accepted: Orders

## 2023-01-14 NOTE — Anesthesia Preprocedure Evaluation (Addendum)
Anesthesia Evaluation  Patient identified by MRN, date of birth, ID band Patient awake    Reviewed: Allergy & Precautions, H&P , NPO status , Patient's Chart, lab work & pertinent test results  Airway Mallampati: II  TM Distance: >3 FB Neck ROM: Full    Dental no notable dental hx.    Pulmonary sleep apnea , former smoker   Pulmonary exam normal breath sounds clear to auscultation       Cardiovascular hypertension, Pt. on medications negative cardio ROS Normal cardiovascular exam Rhythm:Regular Rate:Normal     Neuro/Psych    Depression    CVA  negative psych ROS   GI/Hepatic Neg liver ROS,GERD  ,,  Endo/Other  negative endocrine ROS    Renal/GU Renal InsufficiencyRenal disease  negative genitourinary   Musculoskeletal  (+) Arthritis , Osteoarthritis,    Abdominal  (+) + obese  Peds negative pediatric ROS (+)  Hematology  (+) Blood dyscrasia, anemia   Anesthesia Other Findings   Reproductive/Obstetrics negative OB ROS                             Anesthesia Physical Anesthesia Plan  ASA: 3  Anesthesia Plan: General   Post-op Pain Management: Regional block*   Induction: Intravenous  PONV Risk Score and Plan: 3 and Ondansetron, Dexamethasone, Midazolam and Treatment may vary due to age or medical condition  Airway Management Planned: Oral ETT  Additional Equipment:   Intra-op Plan:   Post-operative Plan: Extubation in OR  Informed Consent: I have reviewed the patients History and Physical, chart, labs and discussed the procedure including the risks, benefits and alternatives for the proposed anesthesia with the patient or authorized representative who has indicated his/her understanding and acceptance.     Dental advisory given  Plan Discussed with: CRNA  Anesthesia Plan Comments: (PAT note by Rachel Poles, PA-C:  72 year old female with pertinent history including HTN,  former smoker (quit 1984), HLD, GERD, BPPV, OSA not on CPAP, CVA, renal insufficiency, anemia.  Follows with vascular surgery at [facility] for history of carotid stenosis s/p left ICA stent 09/2019.  Duplex 08/10/2022 showed 21 to 39% stenosis of the R ICA and patent stent in the left ICA.  Patient was recently evaluated by cardiologist Dr. Judithe Modest at [facility] in April 2024 with complaints of palpitations and DOE.  Event monitor, echo, and stress test were ordered.  All testing was benign.  Echo showed EF 55 to 60%, stress test was nonischemic, low risk, event monitor showed rare PACs and PVCs and 10 short runs of PSVT. Full results below.   Preop labs reviewed, creatinine mildly elevated 1.30, mild anemia with hemoglobin 10.6, otherwise unremarkable.  Event monitor 10/21/2022 (Care Everywhere): Rare PAC s and PVC s   10 Supraventricular Tachycardia runs occurred, the run with the fastest interval lasting 1 min 1 sec with a max rate of 190 bpm, the longest lasting 1 min 13 secs with an avg rate of 154 bpm.   PRELIMINARY FINDINGS  Patient had a min HR of 50 bpm, max HR of 190 bpm, and avg HR of 80 bpm. Predominant underlying rhythm was Sinus Rhythm. First Degree AV Block was present. 10 Supraventricular Tachycardia runs occurred, the run with the fastest interval lasting 1 min 1 sec with a max rate of 190 bpm, the longest lasting 1 min 13 secs with an avg rate of 154 bpm. Supraventricular Tachycardia was detected within +-- 45 seconds of symptomatic patient  event s . Isolated SVEs were rare <1.0% , SVE Couplets were rare <1.0% , and SVE Triplets were rare <1.0% . Isolated VEs were rare <1.0% , and no VE Couplets or VE Triplets were present.   Stress test 10/21/2022 (Care Everywhere):  ECG test result was negative for evidence of ischemia. There was no  chest pain during the exam.   Myocardial perfusion imaging test is normal.   Overall left ventricular systolic function was normal. The calculated   ejection fraction was measured at 66%.   Low cardiovascular risk   TTE 10/14/2022 (Care Everywhere): SUMMARY  Mild left ventricular hypertrophy  Left ventricular systolic function is normal.  LV ejection fraction = 55-60%.  Diffuse thickening of the mitral leaftlet with restricted leaflet opening.  Focal thickening and calcification of the mitral valve leaflet tips--predominantly the posterior  leaflet  There is no comparison study available.    )        Anesthesia Quick Evaluation

## 2023-01-14 NOTE — Progress Notes (Signed)
Anesthesia Chart Review:  72 year old female with pertinent history including HTN, former smoker (quit 1984), HLD, GERD, BPPV, OSA not on CPAP, CVA, renal insufficiency, anemia.  Follows with vascular surgery at [facility] for history of carotid stenosis s/p left ICA stent 09/2019.  Duplex 08/10/2022 showed 21 to 39% stenosis of the R ICA and patent stent in the left ICA.  Patient was recently evaluated by cardiologist Dr. Judithe Modest at [facility] in April 2024 with complaints of palpitations and DOE.  Event monitor, echo, and stress test were ordered.  All testing was benign.  Echo showed EF 55 to 60%, stress test was nonischemic, low risk, event monitor showed rare PACs and PVCs and 10 short runs of PSVT. Full results below.   Preop labs reviewed, creatinine mildly elevated 1.30, mild anemia with hemoglobin 10.6, otherwise unremarkable.  Event monitor 10/21/2022 (Care Everywhere):  Rare PAC s and PVC s    10 Supraventricular Tachycardia runs occurred, the run with the fastest interval lasting 1 min 1 sec with a max rate of 190 bpm, the longest lasting 1 min 13 secs with an avg rate of 154 bpm.   PRELIMINARY FINDINGS  Patient had a min HR of 50 bpm, max HR of 190 bpm, and avg HR of 80 bpm. Predominant underlying rhythm was Sinus Rhythm. First Degree AV Block was present. 10 Supraventricular Tachycardia runs occurred, the run with the fastest interval lasting 1 min 1 sec with a max rate of 190 bpm, the longest lasting 1 min 13 secs with an avg rate of 154 bpm. Supraventricular Tachycardia was detected within +-- 45 seconds of symptomatic patient event s . Isolated SVEs were rare  <1.0% , SVE Couplets were rare  <1.0% , and SVE Triplets were rare  <1.0% . Isolated VEs were rare  <1.0% , and no VE Couplets or VE Triplets were present.   Stress test 10/21/2022 (Care Everywhere):   ECG test result was negative for evidence of ischemia. There was no  chest pain during the exam.    Myocardial perfusion imaging test is  normal.    Overall left ventricular systolic function was normal. The calculated  ejection fraction was measured at 66%.    Low cardiovascular risk   TTE 10/14/2022 (Care Everywhere): SUMMARY  Mild left ventricular hypertrophy  Left ventricular systolic function is normal.  LV ejection fraction = 55-60%.  Diffuse thickening of the mitral leaftlet with restricted leaflet opening.  Focal thickening and calcification of the mitral valve leaflet tips--predominantly the posterior  leaflet  There is no comparison study available.     Zannie Cove [facility]/Anesthesiology Phone [phone] 01/14/2023 11:19 AM

## 2023-01-14 NOTE — Telephone Encounter (Signed)
Add on for CRP faxed to [facility] lab per PCP instruction. Notified pt.

## 2023-01-15 NOTE — Progress Notes (Signed)
 Thx Misty Stanley!

## 2023-01-18 ENCOUNTER — Other Ambulatory Visit: Payer: Self-pay

## 2023-01-18 DIAGNOSIS — R350 Frequency of micturition: Secondary | ICD-10-CM

## 2023-01-18 MED ORDER — CEFDINIR 300 MG PO CAPS
300.0000 mg | ORAL_CAPSULE | Freq: Two times a day (BID) | ORAL | 0 refills | Status: DC
Start: 2023-01-18 — End: 2023-01-29

## 2023-01-19 ENCOUNTER — Encounter: Payer: Self-pay | Admitting: Orthopedic Surgery

## 2023-01-19 NOTE — Progress Notes (Addendum)
Anesthesia Review:  PCP: Danise Edge L[facility] 11/30/22  Cardiologist : Judithe Modest  Nephrology- [facility] 01/05/23 [place]  Chest x-ray : EKG : 01/13/23  Echo : 2019  Stress test:2008  Cardiac Cath :  Activity level: can do a flight of stairs without difficutgly  Sleep Study/ CPAP : Fasting Blood Sugar :      / Checks Blood Sugar -- times a day:   Blood Thinner/ Instructions /Last Dose: ASA / Instructions/ Last Dose :    Preop done on 01/13/23 iwht labs of dbd/diff u/a, urine culture, pcr bmp and T/S .   Preop call made on 01/19/23 to updated pt with date and time of surgery at [facility].  And directions.  Pt has preop instructons from preop on 01/13/23.  PT aware to report at [facility] long on 01/28/23 at 0900am and may have clear liquids until 0815am on 01/28/23 alogn with ERAS drink done by 0815am.    Pt reports no new updated in med hx since 01/13/23 preop except she has been prescribed Cefdinir on 01/19/23 300mg  bid for 5 days for UTI.  PT aware she needs to reports this to Dr August Saucer and will call him today.

## 2023-01-19 NOTE — Telephone Encounter (Signed)
Ok thx.

## 2023-01-24 ENCOUNTER — Encounter: Payer: Self-pay | Admitting: Orthopedic Surgery

## 2023-01-26 ENCOUNTER — Other Ambulatory Visit: Payer: Self-pay | Admitting: Surgical

## 2023-01-26 ENCOUNTER — Other Ambulatory Visit: Payer: Self-pay | Admitting: Orthopedic Surgery

## 2023-01-26 MED ORDER — TRAMADOL HCL 50 MG PO TABS: 50.0000 mg | ORAL_TABLET | Freq: Two times a day (BID) | ORAL | 0 refills | Status: DC | PRN

## 2023-01-28 ENCOUNTER — Ambulatory Visit (HOSPITAL_COMMUNITY): Payer: No Typology Code available for payment source | Admitting: Physician Assistant

## 2023-01-28 ENCOUNTER — Encounter (HOSPITAL_COMMUNITY)
Admission: RE | Disposition: A | Discharge status: Home / Self Care | Payer: Self-pay | Source: Home / Self Care | Attending: Orthopedic Surgery

## 2023-01-28 ENCOUNTER — Observation Stay (HOSPITAL_COMMUNITY): Payer: No Typology Code available for payment source

## 2023-01-28 ENCOUNTER — Encounter (HOSPITAL_COMMUNITY): Payer: Self-pay | Admitting: Orthopedic Surgery

## 2023-01-28 ENCOUNTER — Observation Stay (HOSPITAL_COMMUNITY)
Admission: RE | Admit: 2023-01-28 | Discharge: 2023-01-29 | Disposition: A | Discharge status: Home / Self Care | Payer: No Typology Code available for payment source | Attending: Orthopedic Surgery | Admitting: Orthopedic Surgery

## 2023-01-28 ENCOUNTER — Other Ambulatory Visit: Payer: Self-pay

## 2023-01-28 DIAGNOSIS — Z8673 Personal history of transient ischemic attack (TIA), and cerebral infarction without residual deficits: Secondary | ICD-10-CM | POA: Diagnosis not present

## 2023-01-28 DIAGNOSIS — Z7982 Long term (current) use of aspirin: Secondary | ICD-10-CM | POA: Diagnosis not present

## 2023-01-28 DIAGNOSIS — Z87891 Personal history of nicotine dependence: Secondary | ICD-10-CM | POA: Diagnosis not present

## 2023-01-28 DIAGNOSIS — M7552 Bursitis of left shoulder: Secondary | ICD-10-CM | POA: Diagnosis not present

## 2023-01-28 DIAGNOSIS — Z79899 Other long term (current) drug therapy: Secondary | ICD-10-CM | POA: Insufficient documentation

## 2023-01-28 DIAGNOSIS — M19012 Primary osteoarthritis, left shoulder: Secondary | ICD-10-CM | POA: Diagnosis not present

## 2023-01-28 DIAGNOSIS — Z96612 Presence of left artificial shoulder joint: Secondary | ICD-10-CM

## 2023-01-28 DIAGNOSIS — Z85828 Personal history of other malignant neoplasm of skin: Secondary | ICD-10-CM | POA: Insufficient documentation

## 2023-01-28 DIAGNOSIS — M7522 Bicipital tendinitis, left shoulder: Secondary | ICD-10-CM | POA: Diagnosis not present

## 2023-01-28 DIAGNOSIS — I10 Essential (primary) hypertension: Secondary | ICD-10-CM | POA: Diagnosis not present

## 2023-01-28 DIAGNOSIS — Z01818 Encounter for other preprocedural examination: Secondary | ICD-10-CM

## 2023-01-28 DIAGNOSIS — G8918 Other acute postprocedural pain: Secondary | ICD-10-CM | POA: Diagnosis not present

## 2023-01-28 HISTORY — PX: REVERSE SHOULDER ARTHROPLASTY: SHX5054

## 2023-01-28 HISTORY — PX: BICEPT TENODESIS: SHX5116

## 2023-01-28 LAB — TYPE AND SCREEN
ABO/RH(D): O POS
Antibody Screen: NEGATIVE

## 2023-01-28 SURGERY — ARTHROPLASTY, SHOULDER, TOTAL, REVERSE
Anesthesia: General | Site: Shoulder | Laterality: Left

## 2023-01-28 MED ORDER — PROPOFOL 10 MG/ML IV BOLUS
INTRAVENOUS | Status: AC
  Filled 2023-01-28: qty 20

## 2023-01-28 MED ORDER — MIDAZOLAM HCL 2 MG/2ML IJ SOLN
2.0000 mg | Freq: Once | INTRAMUSCULAR | Status: AC
  Administered 2023-01-28: 2 mg via INTRAVENOUS
  Filled 2023-01-28: qty 2

## 2023-01-28 MED ORDER — PHENYLEPHRINE 80 MCG/ML (10ML) SYRINGE FOR IV PUSH (FOR BLOOD PRESSURE SUPPORT)
PREFILLED_SYRINGE | INTRAVENOUS | Status: DC | PRN
  Administered 2023-01-28 (×2): 120 ug via INTRAVENOUS

## 2023-01-28 MED ORDER — SUCCINYLCHOLINE CHLORIDE 200 MG/10ML IV SOSY
PREFILLED_SYRINGE | INTRAVENOUS | Status: DC | PRN
  Administered 2023-01-28: 120 mg via INTRAVENOUS

## 2023-01-28 MED ORDER — AMISULPRIDE (ANTIEMETIC) 5 MG/2ML IV SOLN: 10.0000 mg | Freq: Once | INTRAVENOUS | Status: DC | PRN

## 2023-01-28 MED ORDER — PHENYLEPHRINE 80 MCG/ML (10ML) SYRINGE FOR IV PUSH (FOR BLOOD PRESSURE SUPPORT)
PREFILLED_SYRINGE | INTRAVENOUS | Status: AC
  Filled 2023-01-28: qty 10

## 2023-01-28 MED ORDER — OXYCODONE HCL 5 MG/5ML PO SOLN: 5.0000 mg | Freq: Once | ORAL | Status: DC | PRN

## 2023-01-28 MED ORDER — LIDOCAINE 2% (20 MG/ML) 5 ML SYRINGE
INTRAMUSCULAR | Status: DC | PRN
  Administered 2023-01-28: 50 mg via INTRAVENOUS

## 2023-01-28 MED ORDER — LACTATED RINGERS IV SOLN: INTRAVENOUS | Status: DC

## 2023-01-28 MED ORDER — FENTANYL CITRATE PF 50 MCG/ML IJ SOSY
100.0000 ug | PREFILLED_SYRINGE | Freq: Once | INTRAMUSCULAR | Status: AC
  Administered 2023-01-28: 50 ug via INTRAVENOUS
  Filled 2023-01-28: qty 2

## 2023-01-28 MED ORDER — PROMETHAZINE HCL 25 MG/ML IJ SOLN: 6.2500 mg | INTRAMUSCULAR | Status: DC | PRN

## 2023-01-28 MED ORDER — ASPIRIN 81 MG PO TBEC
81.0000 mg | DELAYED_RELEASE_TABLET | Freq: Every day | ORAL | Status: DC
  Administered 2023-01-28 – 2023-01-29 (×2): 81 mg via ORAL
  Filled 2023-01-28 (×2): qty 1

## 2023-01-28 MED ORDER — METOPROLOL TARTRATE 50 MG PO TABS
100.0000 mg | ORAL_TABLET | Freq: Two times a day (BID) | ORAL | Status: DC
  Administered 2023-01-28: 50 mg via ORAL
  Administered 2023-01-29: 100 mg via ORAL
  Filled 2023-01-28: qty 2
  Filled 2023-01-28: qty 4
  Filled 2023-01-28: qty 2

## 2023-01-28 MED ORDER — STERILE WATER FOR IRRIGATION IR SOLN
Status: DC | PRN
  Administered 2023-01-28: 1000 mL

## 2023-01-28 MED ORDER — TRANEXAMIC ACID-NACL 1000-0.7 MG/100ML-% IV SOLN
1000.0000 mg | INTRAVENOUS | Status: AC
  Administered 2023-01-28: 1000 mg via INTRAVENOUS
  Filled 2023-01-28: qty 100

## 2023-01-28 MED ORDER — BUPIVACAINE HCL (PF) 0.5 % IJ SOLN
INTRAMUSCULAR | Status: DC | PRN
  Administered 2023-01-28: 20 mL via PERINEURAL

## 2023-01-28 MED ORDER — DOCUSATE SODIUM 100 MG PO CAPS
100.0000 mg | ORAL_CAPSULE | Freq: Two times a day (BID) | ORAL | Status: DC
  Administered 2023-01-28 – 2023-01-29 (×2): 100 mg via ORAL
  Filled 2023-01-28 (×2): qty 1

## 2023-01-28 MED ORDER — HYDROCHLOROTHIAZIDE 12.5 MG PO TABS
12.5000 mg | ORAL_TABLET | Freq: Every day | ORAL | Status: DC
  Administered 2023-01-29: 12.5 mg via ORAL
  Filled 2023-01-28: qty 1

## 2023-01-28 MED ORDER — CEFAZOLIN SODIUM-DEXTROSE 2-4 GM/100ML-% IV SOLN
INTRAVENOUS | Status: AC
  Filled 2023-01-28: qty 100

## 2023-01-28 MED ORDER — VANCOMYCIN HCL 1000 MG IV SOLR
INTRAVENOUS | Status: DC | PRN
  Administered 2023-01-28: 1000 mg

## 2023-01-28 MED ORDER — CHLORHEXIDINE GLUCONATE 0.12 % MT SOLN
15.0000 mL | Freq: Once | OROMUCOSAL | Status: AC
  Administered 2023-01-28: 15 mL via OROMUCOSAL

## 2023-01-28 MED ORDER — ORAL CARE MOUTH RINSE: 15.0000 mL | Freq: Once | OROMUCOSAL | Status: AC

## 2023-01-28 MED ORDER — 0.9 % SODIUM CHLORIDE (POUR BTL) OPTIME
TOPICAL | Status: DC | PRN
  Administered 2023-01-28: 1000 mL

## 2023-01-28 MED ORDER — PHENOL 1.4 % MT LIQD
1.0000 | OROMUCOSAL | Status: DC | PRN
  Administered 2023-01-28: 1 via OROMUCOSAL
  Filled 2023-01-28: qty 177

## 2023-01-28 MED ORDER — POVIDONE-IODINE 10 % EX SWAB: 2.0000 | Freq: Once | CUTANEOUS | Status: DC

## 2023-01-28 MED ORDER — VANCOMYCIN HCL 1000 MG IV SOLR
INTRAVENOUS | Status: AC
  Filled 2023-01-28: qty 20

## 2023-01-28 MED ORDER — IRRISEPT - 450ML BOTTLE WITH 0.05% CHG IN STERILE WATER, USP 99.95% OPTIME
TOPICAL | Status: DC | PRN
  Administered 2023-01-28: 450 mL via TOPICAL

## 2023-01-28 MED ORDER — PHENYLEPHRINE HCL-NACL 20-0.9 MG/250ML-% IV SOLN
INTRAVENOUS | Status: DC | PRN
  Administered 2023-01-28: 50 ug/min via INTRAVENOUS

## 2023-01-28 MED ORDER — OXYCODONE HCL 5 MG PO TABS: 5.0000 mg | ORAL_TABLET | Freq: Once | ORAL | Status: DC | PRN

## 2023-01-28 MED ORDER — DIPHENHYDRAMINE HCL 25 MG PO CAPS
25.0000 mg | ORAL_CAPSULE | Freq: Once | ORAL | Status: AC
  Administered 2023-01-28: 25 mg via ORAL
  Filled 2023-01-28: qty 1

## 2023-01-28 MED ORDER — BUPIVACAINE LIPOSOME 1.3 % IJ SUSP
INTRAMUSCULAR | Status: DC | PRN
Start: 2023-01-28 — End: 2023-01-28
  Administered 2023-01-28: 10 mL via PERINEURAL

## 2023-01-28 MED ORDER — ONDANSETRON HCL 4 MG PO TABS: 4.0000 mg | ORAL_TABLET | Freq: Four times a day (QID) | ORAL | Status: DC | PRN

## 2023-01-28 MED ORDER — ACETAMINOPHEN 500 MG PO TABS
1000.0000 mg | ORAL_TABLET | Freq: Four times a day (QID) | ORAL | Status: DC
  Administered 2023-01-28 – 2023-01-29 (×3): 1000 mg via ORAL
  Filled 2023-01-28 (×3): qty 2

## 2023-01-28 MED ORDER — ONDANSETRON HCL 4 MG/2ML IJ SOLN: 4.0000 mg | Freq: Four times a day (QID) | INTRAMUSCULAR | Status: DC | PRN

## 2023-01-28 MED ORDER — DEXAMETHASONE SODIUM PHOSPHATE 4 MG/ML IJ SOLN
INTRAMUSCULAR | Status: DC | PRN
  Administered 2023-01-28: 5 mg via INTRAVENOUS

## 2023-01-28 MED ORDER — SODIUM CHLORIDE 0.9 % IV SOLN: INTRAVENOUS | Status: DC

## 2023-01-28 MED ORDER — ONDANSETRON HCL 4 MG/2ML IJ SOLN
INTRAMUSCULAR | Status: DC | PRN
Start: 2023-01-28 — End: 2023-01-28
  Administered 2023-01-28: 4 mg via INTRAVENOUS

## 2023-01-28 MED ORDER — CEFAZOLIN SODIUM-DEXTROSE 2-4 GM/100ML-% IV SOLN
2.0000 g | Freq: Three times a day (TID) | INTRAVENOUS | Status: AC
  Administered 2023-01-28 – 2023-01-29 (×3): 2 g via INTRAVENOUS
  Filled 2023-01-28 (×3): qty 100

## 2023-01-28 MED ORDER — TRAMADOL HCL 50 MG PO TABS
100.0000 mg | ORAL_TABLET | Freq: Four times a day (QID) | ORAL | Status: DC
  Administered 2023-01-28 – 2023-01-29 (×4): 100 mg via ORAL
  Filled 2023-01-28 (×4): qty 2

## 2023-01-28 MED ORDER — METHOCARBAMOL 500 MG IVPB - SIMPLE MED
INTRAVENOUS | Status: AC
  Filled 2023-01-28: qty 55

## 2023-01-28 MED ORDER — HYDROMORPHONE HCL 1 MG/ML IJ SOLN
INTRAMUSCULAR | Status: AC
  Filled 2023-01-28: qty 1

## 2023-01-28 MED ORDER — PHENYLEPHRINE HCL-NACL 20-0.9 MG/250ML-% IV SOLN
INTRAVENOUS | Status: AC
  Filled 2023-01-28: qty 250

## 2023-01-28 MED ORDER — ONDANSETRON HCL 4 MG/2ML IJ SOLN
INTRAMUSCULAR | Status: AC
  Filled 2023-01-28: qty 2

## 2023-01-28 MED ORDER — ROCURONIUM BROMIDE 10 MG/ML (PF) SYRINGE
PREFILLED_SYRINGE | INTRAVENOUS | Status: AC
  Filled 2023-01-28: qty 10

## 2023-01-28 MED ORDER — ACETAMINOPHEN 325 MG PO TABS: 325.0000 mg | ORAL_TABLET | Freq: Four times a day (QID) | ORAL | Status: DC | PRN

## 2023-01-28 MED ORDER — POVIDONE-IODINE 7.5 % EX SOLN: Freq: Once | CUTANEOUS | Status: DC

## 2023-01-28 MED ORDER — PROPOFOL 10 MG/ML IV BOLUS
INTRAVENOUS | Status: DC | PRN
  Administered 2023-01-28: 160 mg via INTRAVENOUS

## 2023-01-28 MED ORDER — METHOCARBAMOL 500 MG PO TABS
500.0000 mg | ORAL_TABLET | Freq: Four times a day (QID) | ORAL | Status: DC | PRN
  Administered 2023-01-29: 500 mg via ORAL
  Filled 2023-01-28: qty 1

## 2023-01-28 MED ORDER — DEXAMETHASONE SODIUM PHOSPHATE 10 MG/ML IJ SOLN
INTRAMUSCULAR | Status: AC
  Filled 2023-01-28: qty 1

## 2023-01-28 MED ORDER — METOCLOPRAMIDE HCL 5 MG/ML IJ SOLN: 5.0000 mg | Freq: Three times a day (TID) | INTRAMUSCULAR | Status: DC | PRN

## 2023-01-28 MED ORDER — METHOCARBAMOL 500 MG IVPB - SIMPLE MED
500.0000 mg | Freq: Four times a day (QID) | INTRAVENOUS | Status: DC | PRN
  Administered 2023-01-28: 500 mg via INTRAVENOUS

## 2023-01-28 MED ORDER — HYDROMORPHONE HCL 1 MG/ML IJ SOLN
0.2500 mg | INTRAMUSCULAR | Status: DC | PRN
  Administered 2023-01-28 (×4): 0.5 mg via INTRAVENOUS

## 2023-01-28 MED ORDER — LIDOCAINE HCL (PF) 2 % IJ SOLN
INTRAMUSCULAR | Status: AC
  Filled 2023-01-28: qty 5

## 2023-01-28 MED ORDER — CEFAZOLIN SODIUM-DEXTROSE 2-4 GM/100ML-% IV SOLN
2.0000 g | INTRAVENOUS | Status: AC
  Administered 2023-01-28: 2 g via INTRAVENOUS
  Filled 2023-01-28: qty 100

## 2023-01-28 MED ORDER — MENTHOL 3 MG MT LOZG: 1.0000 | LOZENGE | OROMUCOSAL | Status: DC | PRN

## 2023-01-28 MED ORDER — METOCLOPRAMIDE HCL 5 MG PO TABS: 5.0000 mg | ORAL_TABLET | Freq: Three times a day (TID) | ORAL | Status: DC | PRN

## 2023-01-28 SURGICAL SUPPLY — 79 items
AID PSTN UNV HD RSTRNT DISP (MISCELLANEOUS) ×2
ALCOHOL 70% 16 OZ (MISCELLANEOUS) ×2 IMPLANT
APL PRP STRL LF DISP 70% ISPRP (MISCELLANEOUS) ×4
AUG COMP REV MI TAPER ADAPTER (Joint) ×2 IMPLANT
AUGMENT COMP REV MI TAPR ADPTR (Joint) IMPLANT
BAG COUNTER SPONGE SURGICOUNT (BAG) ×2 IMPLANT
BAG SPNG CNTER NS LX DISP (BAG) ×2
BIT DRILL 1.6MX128 (BIT) IMPLANT
BIT DRILL 2.7 W/STOP DISP (BIT) IMPLANT
BIT DRILL TWIST 2.7 (BIT) IMPLANT
BLADE SAW SGTL 13X75X1.27 (BLADE) ×2 IMPLANT
BRNG HUM +3 36 RVRS SHLDR (Shoulder) ×2 IMPLANT
BSPLAT GLND SM AUG TPR ADPR (Joint) ×2 IMPLANT
CHLORAPREP W/TINT 26 (MISCELLANEOUS) ×4 IMPLANT
COOLER ICEMAN CLASSIC (MISCELLANEOUS) ×2 IMPLANT
COVER SURGICAL LIGHT HANDLE (MISCELLANEOUS) ×2 IMPLANT
DRAIN PENROSE 0.5X18 (DRAIN) IMPLANT
DRAPE INCISE IOBAN 66X45 STRL (DRAPES) ×2 IMPLANT
DRAPE ORTHO SPLIT 77X108 STRL (DRAPES) ×4
DRAPE SURG ORHT 6 SPLT 77X108 (DRAPES) ×4 IMPLANT
DRAPE U-SHAPE 47X51 STRL (DRAPES) ×4 IMPLANT
DRSG AQUACEL AG ADV 3.5X10 (GAUZE/BANDAGES/DRESSINGS) ×2 IMPLANT
ELECT BLADE TIP CTD 4 INCH (ELECTRODE) ×2 IMPLANT
ELECT REM PT RETURN 15FT ADLT (MISCELLANEOUS) ×2 IMPLANT
GAUZE SPONGE 4X4 12PLY STRL (GAUZE/BANDAGES/DRESSINGS) ×4 IMPLANT
GLENOID SPHERE 36+6 (Joint) IMPLANT
GLOVE BIOGEL PI IND STRL 6.5 (GLOVE) ×2 IMPLANT
GLOVE BIOGEL PI IND STRL 8 (GLOVE) ×2 IMPLANT
GLOVE ECLIPSE 6.5 STRL STRAW (GLOVE) ×2 IMPLANT
GLOVE ECLIPSE 8.0 STRL XLNG CF (GLOVE) ×2 IMPLANT
GOWN STRL REUS W/ TWL LRG LVL3 (GOWN DISPOSABLE) ×2 IMPLANT
GOWN STRL REUS W/ TWL XL LVL3 (GOWN DISPOSABLE) ×2 IMPLANT
GOWN STRL REUS W/TWL LRG LVL3 (GOWN DISPOSABLE) ×2
GOWN STRL REUS W/TWL XL LVL3 (GOWN DISPOSABLE) ×2
GUIDE RSA SHLD BM ROT L SU (ORTHOPEDIC DISPOSABLE SUPPLIES) IMPLANT
HYDROGEN PEROXIDE 16OZ (MISCELLANEOUS) ×2 IMPLANT
JET LAVAGE IRRISEPT WOUND (IRRIGATION / IRRIGATOR) ×2
KIT BASIN OR (CUSTOM PROCEDURE TRAY) ×2 IMPLANT
KIT TURNOVER KIT A (KITS) IMPLANT
LAVAGE JET IRRISEPT WOUND (IRRIGATION / IRRIGATOR) ×2 IMPLANT
MANIFOLD NEPTUNE II (INSTRUMENTS) ×2 IMPLANT
NDL SUT 6 .5 CRC .975X.05 MAYO (NEEDLE) IMPLANT
NEEDLE MAYO TAPER (NEEDLE)
NS IRRIG 1000ML POUR BTL (IV SOLUTION) ×2 IMPLANT
PACK SHOULDER (CUSTOM PROCEDURE TRAY) ×2 IMPLANT
PAD COLD SHLDR WRAP-ON (PAD) ×2 IMPLANT
PIN STEINMANN THREADED TIP (PIN) IMPLANT
PIN THREADED REVERSE (PIN) IMPLANT
REAMER GUIDE BUSHING SURG DISP (MISCELLANEOUS) IMPLANT
REAMER GUIDE W/SCREW AUG (MISCELLANEOUS) IMPLANT
RESTRAINT HEAD UNIVERSAL NS (MISCELLANEOUS) ×2 IMPLANT
RETRIEVER SUT HEWSON (MISCELLANEOUS) ×2 IMPLANT
SCREW BONE LOCKING 4.75X30X3.5 (Screw) IMPLANT
SCREW BONE STRL 6.5MMX25MM (Screw) IMPLANT
SCREW LOCKING 4.75MMX15MM (Screw) IMPLANT
SCREW LOCKING STRL 4.75X25X3.5 (Screw) IMPLANT
SLING ARM IMMOBILIZER LRG (SOFTGOODS) ×2 IMPLANT
SPONGE T-LAP 4X18 ~~LOC~~+RFID (SPONGE) ×2 IMPLANT
STEM HUMERAL STRL 9MMX83MM (Stem) IMPLANT
STRIP CLOSURE SKIN 1/2X4 (GAUZE/BANDAGES/DRESSINGS) ×2 IMPLANT
SUCTION TUBE FRAZIER 10FR DISP (SUCTIONS) IMPLANT
SUT BROADBAND TAPE 2PK 1.5 (SUTURE) IMPLANT
SUT FIBERWIRE #2 38 T-5 BLUE (SUTURE)
SUT MAXBRAID (SUTURE) IMPLANT
SUT MNCRL AB 3-0 PS2 18 (SUTURE) ×2 IMPLANT
SUT SILK 2 0 (SUTURE) ×2
SUT SILK 2-0 18XBRD TIE 12 (SUTURE) ×2 IMPLANT
SUT VIC AB 0 CT1 27 (SUTURE) ×8
SUT VIC AB 0 CT1 27XBRD ANBCTR (SUTURE) ×8 IMPLANT
SUT VIC AB 1 CT1 27 (SUTURE) ×4
SUT VIC AB 1 CT1 27XBRD ANBCTR (SUTURE) ×4 IMPLANT
SUT VIC AB 2-0 CT1 27 (SUTURE) ×6
SUT VIC AB 2-0 CT1 TAPERPNT 27 (SUTURE) ×6 IMPLANT
SUT VICRYL 0 UR6 27IN ABS (SUTURE) ×4 IMPLANT
SUTURE FIBERWR #2 38 T-5 BLUE (SUTURE) IMPLANT
TOWEL OR 17X26 10 PK STRL BLUE (TOWEL DISPOSABLE) ×2 IMPLANT
TRAY HUM REV SHOULDER 36 +3 (Shoulder) IMPLANT
TRAY HUM REV SHOULDER STD +6 (Shoulder) IMPLANT
WATER STERILE IRR 1000ML POUR (IV SOLUTION) ×2 IMPLANT

## 2023-01-28 NOTE — H&P (Signed)
[name] is an 72 y.o. female.   Chief Complaint: Left shoulder pain HPI: [name] is a 72 y.o. female who presents  reporting left shoulder pain of approximately 1 year duration..  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  CT scan reviewed  confirms bone-on-bone arthritis.  Patient has not had prior shoulder surgery.  Patient has had elevated sed rate and C-reactive protein which have normalized with treatment of urinary tract infection.  She denies any fevers or chills associated with her shoulder pain.  Aspiration of the shoulder has been performed and cultures are negative to date.  .  Patient has right shoulder arthritis as well.  Left shoulder pain is severe and debilitating.  Past Medical History:  Diagnosis Date   Anemia    Arthritis    Benign paroxysmal positional vertigo 11/14/2013   Colon polyp    Depression    Diverticulosis    Dysrhythmia    Essential hypertension, benign    GERD (gastroesophageal reflux disease) 09/02/2013   GI bleed    Glaucoma    Glucose intolerance (impaired glucose tolerance)    H/O tobacco use, presenting hazards to health 09/02/2013   History  of basal cell carcinoma    History of cardiac arrhythmia    Hyperlipidemia    Hypertension    Medicare welcome visit 11/28/2012   Obesity 02/08/2017   Palpitations 02/04/2017   Personal history of colonic polyps 09/02/2013   Patient reports H/O 2 colonoscopies, last one dates back to 2012 in [place] She reports they were labeled as incomplete.  Reports 1st colonoscopy had 1 benign polyp and the 2nd was clear.    Renal insufficiency 04/28/2021   Sleep apnea 10/01/2016   Not on CPAP machine    Stroke [facility])    UTI (urinary tract infection)     Past Surgical History:  Procedure Laterality Date   CAROTID STENT     CERVICAL CONE BIOPSY  1960s   COLONOSCOPY  2016   DILATION AND CURETTAGE OF UTERUS  1966   KNEE SURGERY Left 11/01/2008   left knee total replacement Dr Lajoyce Corners    WISDOM TOOTH EXTRACTION      Family History  Problem Relation Age of Onset   Coronary artery disease Paternal Grandmother    Hyperlipidemia Paternal Grandmother    Hypertension Paternal Grandmother    Stroke Paternal Grandmother    Hypertension Mother    Lung cancer Mother        smoker   Uterine cancer Mother    Birth defects Mother        lung cancer in smoker   COPD Mother    Hypertension Father    Kidney disease Father        Renal Cell Carcinoma   Birth defects Father        renal cell CA, smoker   Hypertension Maternal Grandmother    Hypertension Maternal Grandfather    Hypertension Paternal Grandfather    Dementia Paternal Grandfather    Cancer Brother        testicular   Heart disease Brother        NI, smoker   Hyperlipidemia Other    Colon cancer Neg Hx    Esophageal cancer Neg Hx    Social History:  reports that she quit smoking about 40 years ago. Her smoking use included cigarettes. She started smoking about 55 years ago. She has a 15 pack-year smoking history. She has never used  smokeless tobacco. She reports current alcohol use. She reports that she does not use drugs.  Allergies: No Known Allergies  Medications Prior to Admission  Medication Sig Dispense Refill   acetaminophen (TYLENOL) 650 MG CR tablet Take 650-1,300 mg by mouth every 8 (eight) hours as needed for pain.     aspirin EC 81 MG tablet Take 81 mg by mouth at bedtime.     Cyanocobalamin (VITAMIN B-12 PO) Take 1 tablet by mouth in the morning.     hydrochlorothiazide (MICROZIDE) 12.5 MG capsule Take 1 capsule by mouth once daily 90 capsule 0   lovastatin (MEVACOR) 40 MG tablet TAKE 1 TABLET BY MOUTH AT BEDTIME 90 tablet 0   metoprolol tartrate (LOPRESSOR) 100 MG tablet Take 1 tablet by mouth twice daily 180 tablet 0   Multiple Vitamins-Minerals (MULTIVITAMIN WOMEN PO) Take 1 tablet by mouth in the morning.     Oxymetazoline HCl (NASAL SPRAY) 0.05 % SOLN Place 1 spray into the nose 2 (two)  times daily as needed (congestion.).     traMADol (ULTRAM) 50 MG tablet Take 1-2 tablets (50-100 mg total) by mouth every 12 (twelve) hours as needed. 14 tablet 0   tretinoin (RETIN-A) 0.1 % cream APPLY TO AFFECTED AREA(S) OF FACE ONE TIME DAILY AT BEDTIME USING A PEA SIZED AMOUNT (Patient taking differently: Apply 1 Application topically in the morning.) 45 g 5   cefdinir (OMNICEF) 300 MG capsule Take 1 capsule (300 mg total) by mouth 2 (two) times daily. 10 capsule 0    No results found for this or any previous visit (from the past 48 hour(s)). No results found.  Review of Systems  Musculoskeletal:  Positive for arthralgias.  All other systems reviewed and are negative.   Blood pressure (!) 143/69, pulse 81, temperature 98.2 F (36.8 C), temperature source Oral, resp. rate 16, height 5\' 1"  (1.549 m), weight 84.5 kg, SpO2 97%. Physical Exam Vitals reviewed.  HENT:     Head: Normocephalic.     Nose: Nose normal.     Mouth/Throat:     Mouth: Mucous membranes are moist.  Eyes:     Pupils: Pupils are equal, round, and reactive to light.  Cardiovascular:     Rate and Rhythm: Normal rate.     Pulses: Normal pulses.  Pulmonary:     Effort: Pulmonary effort is normal.  Abdominal:     General: Abdomen is flat.  Musculoskeletal:     Cervical back: Normal range of motion.  Skin:    General: Skin is warm.     Capillary Refill: Capillary refill takes less than 2 seconds.  Neurological:     General: No focal deficit present.     Mental Status: She is alert.  Psychiatric:        Mood and Affect: Mood normal.     Ortho exam demonstrates functional deltoid.  Diminished and painful range of motion is present on the left-hand side with intact subscap strength.  Shoulder itself is not warm compared to the right-hand side and there is no lymphadenopathy.  Cervical spine range of motion intact.  Passive range of motion is painful with crepitus present.  Patient does not have forward flexion  and abduction above 90 degrees on the left-hand side. Assessment/Plan Impression is severe left shoulder arthritis.  Plan is reverse shoulder replacement.  Risk benefits are discussed with the patient include not limited to infection or vessel damage incomplete restoration of function as well as incomplete pain relief.  Dislocation  also risk.  Patient understands these risks and benefits and wishes to proceed.  All questions answered.  Patient specific instrumentation and guides have been prepared to optimize implant position and longevity.  Burnard Bunting, MD 01/28/2023, 10:13 AM

## 2023-01-28 NOTE — Progress Notes (Signed)
Pt noted to have a red, raised, rough feeling and as well as itchy circle on her arm, about the size of a small palm, at the bottom of pts surgical dressing that extends several cms below the dressing. Notified the on call provider. Received a call back from Dr. Jacklynn Barnacle. New orders for Benadryl 25mg  x1 and to just leave the dressing in place for now and they will assess it in the am.

## 2023-01-28 NOTE — Transfer of Care (Signed)
Immediate Anesthesia Transfer of Care Note  Patient: [name]  Procedure(s) Performed: LEFT REVERSE SHOULDER ARTHROPLASTY (Left: Shoulder) BICEPS TENODESIS (Left)  Patient Location: PACU  Anesthesia Type:General  Level of Consciousness: awake, alert , and oriented  Airway & Oxygen Therapy: Patient Spontanous Breathing and Patient connected to face mask oxygen  Post-op Assessment: Report given to RN and Post -op Vital signs reviewed and stable  Post vital signs: Reviewed and stable  Last Vitals:  Vitals Value Taken Time  BP 156/101   Temp    Pulse 81 01/28/23 1454  Resp 19 01/28/23 1454  SpO2 98 % 01/28/23 1454  Vitals shown include unfiled device data.  Last Pain:  Vitals:   01/28/23 0928  TempSrc: Oral  PainSc:          Complications: No notable events documented.

## 2023-01-28 NOTE — Anesthesia Procedure Notes (Signed)
Anesthesia Regional Block: Interscalene brachial plexus block   Pre-Anesthetic Checklist: , timeout performed,  Correct Patient, Correct Site, Correct Laterality,  Correct Procedure, Correct Position, site marked,  Risks and benefits discussed,  Surgical consent,  Pre-op evaluation,  At surgeon's request and post-op pain management  Laterality: Left  Prep: chloraprep       Needles:  Injection technique: Single-shot  Needle Type: Stimiplex     Needle Length: 9cm  Needle Gauge: 21     Additional Needles:   Procedures:,,,, ultrasound used (permanent image in chart),,    Narrative:  Start time: 01/28/2023 10:40 AM End time: 01/28/2023 10:45 AM Injection made incrementally with aspirations every 5 mL.  Performed by: Personally  Anesthesiologist: Lowella Curb, MD

## 2023-01-28 NOTE — Plan of Care (Signed)
  Problem: Education: Goal: Knowledge of the prescribed therapeutic regimen will improve Outcome: Progressing   Problem: Activity: Goal: Ability to tolerate increased activity will improve Outcome: Progressing   Problem: Pain Management: Goal: Pain level will decrease with appropriate interventions Outcome: Progressing   

## 2023-01-28 NOTE — Brief Op Note (Signed)
   01/28/2023  2:36 PM  PATIENT:  [name]  72 y.o. female  PRE-OPERATIVE DIAGNOSIS:  left shoulder osteoarthritis, biceps tendinitis  POST-OPERATIVE DIAGNOSIS:  left shoulder osteoarthritis, biceps tendinitis  PROCEDURE:  Procedure(s): LEFT REVERSE SHOULDER ARTHROPLASTY BICEPS TENODESIS  SURGEON:  Surgeon(s): August Saucer, Corrie Mckusick, MD  ASSISTANT: magnant pa  ANESTHESIA:   general  EBL: 100 ml    Total I/O In: 200 [IV Piggyback:200] Out: 150 [Blood:150]  BLOOD ADMINISTERED: none  DRAINS: none   LOCAL MEDICATIONS USED:  vanco  SPECIMEN:  No Specimen  COUNTS:  YES  TOURNIQUET:  * No tourniquets in log *  DICTATION: .Other Dictation: Dictation Number done  PLAN OF CARE: Admit for overnight observation  PATIENT DISPOSITION:  PACU - hemodynamically stable

## 2023-01-28 NOTE — Anesthesia Postprocedure Evaluation (Signed)
Anesthesia Post Note  Patient: Dante Biedron Hammac  Procedure(s) Performed: LEFT REVERSE SHOULDER ARTHROPLASTY (Left: Shoulder) BICEPS TENODESIS (Left)     Patient location during evaluation: PACU Anesthesia Type: General Level of consciousness: awake and alert, oriented and patient cooperative Pain management: pain level controlled (pain improving) Vital Signs Assessment: post-procedure vital signs reviewed and stable Respiratory status: spontaneous breathing, nonlabored ventilation, respiratory function stable and patient connected to nasal cannula oxygen Cardiovascular status: blood pressure returned to baseline and stable Postop Assessment: no apparent nausea or vomiting Anesthetic complications: no   No notable events documented.  Last Vitals:  Vitals:   01/28/23 1500 01/28/23 1515  BP: (!) 95/50 (!) 117/57  Pulse: 79 75  Resp: 20 (!) 23  Temp:    SpO2: 92% 95%    Last Pain:  Vitals:   01/28/23 1525  TempSrc:   PainSc: 10-Worst pain ever                 Finis Hendricksen,E. Flornce Record

## 2023-01-28 NOTE — Op Note (Signed)
NAMESTEPHANIA, [name]. MEDICAL RECORD NO: [id number] ACCOUNT NO: [id number] DATE OF BIRTH: July 17, 1950 FACILITY: [facility] LOCATION: [facility] PHYSICIAN: Graylin Shiver. August Saucer, MD  Operative Report   DATE OF PROCEDURE: 01/28/2023  PREOPERATIVE DIAGNOSIS:  Left shoulder glenohumeral arthritis and biceps tendinitis.  POSTOPERATIVE DIAGNOSIS:  Left shoulder glenohumeral arthritis and biceps tendinitis.  PROCEDURE:  Left shoulder reverse shoulder replacement with biceps tenodesis. Components utilized include Biomet components, small augmented baseplate with 1 central compression screw, 3 peripheral locking screws, 36 mm +6 mm offset glenosphere and a 9  mini humeral stem with 40 mm +6 offset tray and +3 polyethylene liner.  SURGEON:  Graylin Shiver. August Saucer, MD  ASSISTANT:  Karenann Cai.  INDICATIONS:  This is a 72 year old patient with severe left shoulder arthritis and pain refractory to nonoperative management.  She presents now for operative management after explanation of risks and benefits.  DESCRIPTION OF PROCEDURE:  The patient was brought to the operating room where general endotracheal anesthesia was induced.  Preoperative antibiotics administered.  Timeout was called.  The patient was placed in the beach chair position with head in  neutral position.  Left shoulder, arm and hand was prescrubbed with hydrogen peroxide and alcohol and Betadine, which was allowed to air dry, then prepped with ChloraPrep solution and draped in sterile manner.  Ioban used to seal the operative field and  then cover the operative field.  After calling timeout, a deltopectoral approach was made.  Skin and subcutaneous tissue were sharply divided.  IrriSept solution utilized.  Cephalic vein mobilized laterally.  The interval between the deltoid and the  pectoralis major tendon and muscle was developed.  Manual elevation of the anterior portion of the deltoid attachment was performed.  Subdeltoid adhesions were released.  Axillary  nerve palpated and protected at all times during the case.  The biceps  tendon was then tenodesed to the surrounding tissue as well as to the pec tendon.  The remnant biceps tendon was then taken up and the rotator interval was opened.  This opening was performed all the way to the base of the coracoid.  Next, the circumflex  vessels were ligated.  Subscap tendon was then tagged and detached from the lesser tuberosity using 15 scalpel.  As a sleeve the capsule was detached along the inferior humeral neck about 2 cm then that dissection was taken around to the 7 o'clock  position using a Cobb elevator. Humeral head was dislocated.  The Browne retractor was placed.  Reaming was performed on the humerus.  Shoulder joint was extremely arthritic, but no obvious evidence of infection or purulence was present.  There was  significant clear effusion in the joint along with end-stage arthritis on both the humeral side and the glenoid side.  After reaming up to a size 10 the humeral head was cut in 30 degrees of retroversion, which was in line with her native version.   Broaching then performed up to a size 9, which was very stable.  A cap was placed.  Posterior retractor was then placed behind the glenoid.  Anterior retractor placed with care being taken to avoid injury to the axillary nerve, the labrum was excised  circumferentially along with the biceps tendon.  Then, a Bankart lesion created from the 6 o'clock to 12 o'clock position.  Next, the patient-specific guide was placed on the glenoid and the guide pins were placed in appropriate position.  Reaming was  then performed about 4.5 mm.  Subsequent reaming performed for the small  augment.  Very good bony contact achieved.  A augmented baseplate with small augment was then placed with 1 central compression screw, 3 peripheral locking screws.  Very stable  construct.  Next, reduction was performed using the +3 glenosphere, +6 glenosphere with multiple humeral  combinations.  The most stable combination was the +6 glenosphere with the +6 offset humeral tray and the standard liner as well as the +3 liner.   Thorough irrigation was performed, the humeral stem was removed, irrigated with irrigating solution and then IrriSept solution was allowed to stay in the canal.  Next, the true glenosphere was placed and tapes were placed and then 6 suture tapes were  placed into the lesser tuberosity for subscap reattachment.  Next, the true stem was placed after placing some vancomycin into the canal after removing the IrriSept.  Very good fit was achieved.  A repeat reduction was performed with the standard liner  plus the +3 polyethylene liner and the +3 was most stable with extension, adduction and forward force along with internal and external rotation, 90 degrees abduction.  Difficult to relocate, difficult to reduce, "two fingers tight."  Onto the clean and  dry Morse taper the tray was placed and the same stability parameters were maintained.  Axillary nerve again palpated and found to be intact.  Thorough irrigation then performed with 3 liters of irrigating solution.  Next the subscap was reattached to  the lesser tuberosity using the suture tapes using Nice knots.  This was done with the arm in 30 degrees of external rotation.  Next IrriSept solution used on the implant followed by vancomycin powder and then the rotator interval was closed using #1  Vicryl suture with the arm in 30 degrees of external rotation.  At this time, IrriSept solution used on the closed deltopectoral interval and then vancomycin powder placed.  Deltopectoral interval was then closed using #1 Vicryl suture followed by  interrupted inverted 0 Vicryl suture, 2-0 Vicryl suture, and 3-0 Monocryl with Steri-Strips and Aquacel dressing applied.  A shoulder immobilizer applied.  Luke's assistance was required at all times for retraction, opening, closing, mobilization of  tissue.  His assistance was  a medical necessity.   PUS D: 01/28/2023 2:44:32 pm T: 01/28/2023 3:10:00 pm  JOB: [id number]/ [id number]

## 2023-01-28 NOTE — Anesthesia Procedure Notes (Signed)
Procedure Name: Intubation Date/Time: 01/28/2023 11:40 AM  Performed by: Vanessa , CRNAPre-anesthesia Checklist: Patient identified, Emergency Drugs available, Suction available and Patient being monitored Patient Re-evaluated:Patient Re-evaluated prior to induction Oxygen Delivery Method: Circle system utilized Preoxygenation: Pre-oxygenation with 100% oxygen Induction Type: IV induction Ventilation: Mask ventilation without difficulty Laryngoscope Size: 2 and Miller Grade View: Grade I Tube type: Oral Tube size: 7.0 mm Number of attempts: 1 Airway Equipment and Method: Stylet Placement Confirmation: ETT inserted through vocal cords under direct vision, positive ETCO2 and breath sounds checked- equal and bilateral Secured at: 21 cm Tube secured with: Tape Dental Injury: Teeth and Oropharynx as per pre-operative assessment

## 2023-01-29 ENCOUNTER — Encounter (HOSPITAL_COMMUNITY): Payer: Self-pay | Admitting: Orthopedic Surgery

## 2023-01-29 ENCOUNTER — Other Ambulatory Visit: Payer: Self-pay | Admitting: Orthopedic Surgery

## 2023-01-29 DIAGNOSIS — M19012 Primary osteoarthritis, left shoulder: Secondary | ICD-10-CM | POA: Diagnosis not present

## 2023-01-29 MED ORDER — DOCUSATE SODIUM 100 MG PO CAPS: 100.0000 mg | ORAL_CAPSULE | Freq: Two times a day (BID) | ORAL | 0 refills | Status: DC

## 2023-01-29 MED ORDER — HYDROCODONE-ACETAMINOPHEN 5-325 MG PO TABS
1.0000 | ORAL_TABLET | Freq: Four times a day (QID) | ORAL | Status: DC | PRN
  Administered 2023-01-29: 1 via ORAL
  Filled 2023-01-29: qty 1

## 2023-01-29 MED ORDER — HYDROCODONE-ACETAMINOPHEN 5-325 MG PO TABS: 1.0000 | ORAL_TABLET | Freq: Every day | ORAL | 0 refills | Status: DC | PRN

## 2023-01-29 MED ORDER — TRAMADOL HCL 50 MG PO TABS: 100.0000 mg | ORAL_TABLET | Freq: Four times a day (QID) | ORAL | 1 refills | Status: DC

## 2023-01-29 MED ORDER — METHOCARBAMOL 500 MG PO TABS: 500.0000 mg | ORAL_TABLET | Freq: Three times a day (TID) | ORAL | 0 refills | Status: AC | PRN

## 2023-01-29 NOTE — Progress Notes (Signed)
  Subjective: Pt stable    Objective: Vital signs in last 24 hours: Temp:  [97.3 F (36.3 C)-98.3 F (36.8 C)] 98.3 F (36.8 C) (08/23 0617) Pulse Rate:  [67-83] 77 (08/23 0617) Resp:  [15-23] 15 (08/23 0617) BP: (95-175)/(50-109) 116/53 (08/23 0617) SpO2:  [92 %-100 %] 94 % (08/23 0617) Weight:  [84.5 kg] 84.5 kg (08/22 0919)  Intake/Output from previous day: 08/22 0701 - 08/23 0700 In: 2692.8 [P.O.:480; I.V.:1757.8; IV Piggyback:455] Out: 150 [Blood:150] Intake/Output this shift: No intake/output data recorded.  Exam:  Intact pulses distally No cellulitis present  Labs: No results for input(s): "HGB" in the last 72 hours. No results for input(s): "WBC", "RBC", "HCT", "PLT" in the last 72 hours. No results for input(s): "NA", "K", "CL", "CO2", "BUN", "CREATININE", "GLUCOSE", "CALCIUM" in the last 72 hours. No results for input(s): "LABPT", "INR" in the last 72 hours.  Assessment/Plan: Discharge after physical therapy/Occupational Therapy.  Deltoid fires.  CPM machine 1 hour 3 times a day.  Follow-up in 2 weeks.  We will try hydrocodone 1 tablet/day for pain relief as needed.   G Scott Marilyn Wing 01/29/2023, 8:01 AM

## 2023-01-29 NOTE — Progress Notes (Signed)
   01/29/23 0836  TOC Brief Assessment  Insurance and Status Reviewed  Patient has primary care physician Yes  Home environment has been reviewed Resides alone  Prior level of function: Independent at baseline  Prior/Current Home Services No current home services  Social Determinants of Health Reivew SDOH reviewed no interventions necessary  Readmission risk has been reviewed Yes  Transition of care needs no transition of care needs at this time

## 2023-01-29 NOTE — Evaluation (Signed)
Occupational Therapy Evaluation Patient Details Name: [name] MRN: [id number] DOB: May 04, 1951 Today's Date: 01/29/2023   History of Present Illness [name] is a 72 yr old female who is s/p a L shoulder reverse replacement on 01-28-23, due to osteoarthritis and biceps tendinitis.   Clinical Impression   Pt is s/p L shoulder replacement on 01-28-23. Therapist provided education and instruction to patient with regards to ROM/exercises, post-op precautions, UE and sling positioning, donning upper extremity clothing, recommendations for bathing while maintaining shoulder precautions, use of ice for pain and edema management, and correctly donning/doffing sling. Patient verbalized and demonstrated understanding as needed. Patient needed assistance to donn an around the back shirt and sling, underwear, and shorts, with instruction on compensatory strategies to perform ADLs. Patient to follow up with MD for further therapy needs.         If plan is discharge home, recommend the following: Assistance with cooking/housework;Assist for transportation;Help with stairs or ramp for entrance    Functional Status Assessment  Patient has had a recent decline in their functional status and demonstrates the ability to make significant improvements in function in a reasonable and predictable amount of time.  Equipment Recommendations  None recommended by OT    Recommendations for Other Services       Precautions / Restrictions Precautions Precautions: Shoulder Type of Shoulder Precautions: sling at all times except ADLs and exercise, okay to perform LUE elbow, wrist, and hand ROM, okay to perform LUE pendulums, no shoulder AROM, can perform 0-30 degrees of external rotation Shoulder Interventions: Shoulder sling/immobilizer Precaution Booklet Issued: Yes (comment) Required Braces or Orthoses: Sling Restrictions Weight Bearing Restrictions: Yes LUE Weight Bearing: Non weight bearing       Mobility Bed Mobility      General bed mobility comments: Pt was received seated in the bedside chair    Transfers Overall transfer level: Needs assistance   Transfers: Sit to/from Stand Sit to Stand: Supervision                  Balance Overall balance assessment: No apparent balance deficits (not formally assessed)         ADL either performed or assessed with clinical judgement       Pertinent Vitals/Pain Pain Assessment Pain Assessment: 0-10 Pain Score: 8  Pain Location: L shoulder Pain Intervention(s): Limited activity within patient's tolerance, Monitored during session, RN gave pain meds during session        Communication Communication Communication: No apparent difficulties   Cognition Arousal: Alert Behavior During Therapy: WFL for tasks assessed/performed Overall Cognitive Status: Within Functional Limits for tasks assessed          General Comments: Oriented x4, able to follow commands without difficulty           Shoulder Instructions Shoulder Instructions Donning/doffing shirt without moving shoulder: Moderate assistance (Pt was instructed on donning a button up shirt while seated in the bedside chair) Method for sponge bathing under operated UE: Patient able to independently direct caregiver Donning/doffing sling/immobilizer: Minimal assistance (OT instructed pt on donning sling) Correct positioning of sling/immobilizer: Minimal assistance (Instruction provided to pt) Pendulum exercises (written home exercise program): Patient able to independently direct caregiver ROM for elbow, wrist and digits of operated UE: Patient able to independently direct caregiver Sling wearing schedule (on at all times/off for ADL's): Patient able to independently direct caregiver Proper positioning of operated UE when showering: Patient able to independently direct caregiver Dressing change:  (N/A) Positioning of UE  while sleeping: Patient able to  independently direct caregiver    Home Living Family/patient expects to be discharged to:: Private residence Living Arrangements: Alone   Type of Home: House Home Access: Stairs to enter Secretary/administrator of Steps: 3 Entrance Stairs-Rails: Left;Right Home Layout: One level     Bathroom Shower/Tub: Runner, broadcasting/film/video: Shower seat - built in;Cane - single point          Prior Functioning/Environment Prior Level of Function : Independent/Modified Independent;Driving             Mobility Comments: Independent with ambulation. ADLs Comments: Independent with ADLs, driving, cooking, and cleaning.        OT Problem List: Impaired UE functional use      OT Treatment/Interventions:   N/A      OT Frequency:  N/A       AM-PAC OT "6 Clicks" Daily Activity     Outcome Measure Help from another person eating meals?: None Help from another person taking care of personal grooming?: None Help from another person toileting, which includes using toliet, bedpan, or urinal?: A Little Help from another person bathing (including washing, rinsing, drying)?: A Little Help from another person to put on and taking off regular upper body clothing?: A Lot Help from another person to put on and taking off regular lower body clothing?: A Little 6 Click Score: 19   End of Session Equipment Utilized During Treatment: Other (comment) (N/A) Nurse Communication: Other (comment) (Nurse cleared pt for participation in therapy)  Activity Tolerance: Patient tolerated treatment well Patient left: in chair;with call bell/phone within reach  OT Visit Diagnosis: Muscle weakness (generalized) (M62.81)                Time: 0930-1001 OT Time Calculation (min): 31 min Charges:  OT General Charges $OT Visit: 1 Visit OT Evaluation $OT Eval Low Complexity: 1 Low OT Treatments $Self Care/Home Management : 8-22 mins    Reuben Likes, OTR/L 01/29/2023, 11:01 AM

## 2023-01-29 NOTE — Plan of Care (Signed)
  Problem: Education: Goal: Knowledge of the prescribed therapeutic regimen will improve Outcome: Progressing   Problem: Activity: Goal: Ability to tolerate increased activity will improve Outcome: Progressing   Problem: Pain Management: Goal: Pain level will decrease with appropriate interventions Outcome: Progressing   

## 2023-01-29 NOTE — Progress Notes (Signed)
Provided discharge education/instructions, all questions answered. Pt not in any distress. Pt discharged home with all of her belongings accompanied by her friend.

## 2023-02-04 ENCOUNTER — Encounter: Payer: Self-pay | Admitting: Orthopedic Surgery

## 2023-02-05 NOTE — Discharge Summary (Signed)
Physician Discharge Summary      Patient ID: [name] MRN: [id number] DOB/AGE: 1950/09/29 72 y.o.  Admit date: 01/28/2023 Discharge date: 01/29/2023  Admission Diagnoses:  Principal Problem:   S/P reverse total shoulder arthroplasty, left   Discharge Diagnoses:  Same  Surgeries: Procedure(s): LEFT REVERSE SHOULDER ARTHROPLASTY BICEPS TENODESIS on 01/28/2023   Consultants:   Discharged Condition: Stable  Hospital Course: [name] is an 72 y.o. female who was admitted 01/28/2023 with a chief complaint of left shoulder pain, and found to have a diagnosis of left shoulder osteoarthritis.  They were brought to the operating room on 01/28/2023 and underwent the above named procedures.  Pt awoke from anesthesia without complication and was transferred to the floor. On POD1, patient's pain was controlled, blocks no effect.  Discharged home on POD 1.  No red flag signs or symptoms throughout her stay..  Pt will f/u with Dr. August Saucer in clinic in ~2 weeks.   Antibiotics given:  Anti-infectives (From admission, onward)    Start     Dose/Rate Route Frequency Ordered Stop   01/28/23 2000  ceFAZolin (ANCEF) IVPB 2g/100 mL premix        2 g 200 mL/hr over 30 Minutes Intravenous Every 8 hours 01/28/23 1640 01/29/23 1204   01/28/23 1207  vancomycin (VANCOCIN) powder  Status:  Discontinued          As needed 01/28/23 1207 01/28/23 1644   01/28/23 0930  ceFAZolin (ANCEF) IVPB 2g/100 mL premix        2 g 200 mL/hr over 30 Minutes Intravenous On call to O.R. 01/28/23 0915 01/28/23 1142   01/28/23 0922  ceFAZolin (ANCEF) 2-4 GM/100ML-% IVPB       Note to Pharmacy: [facility] B: cabinet override      01/28/23 0922 01/28/23 1146     .  Recent vital signs:  Vitals:   01/29/23 0617 01/29/23 0935  BP: (!) 116/53 (!) 167/70  Pulse: 77 86  Resp: 15 16  Temp: 98.3 F (36.8 C) (!) 97.3 F (36.3 C)  SpO2: 94% 98%    Recent laboratory studies:  Results for orders placed or  performed during the hospital encounter of 01/28/23  Type and screen  Result Value Ref Range   ABO/RH(D) O POS    Antibody Screen NEG    Sample Expiration      01/31/2023,2359 Performed at [facility], [address] [place], [place] [address]     Discharge Medications:   Allergies as of 01/29/2023   No Known Allergies      Medication List     STOP taking these medications    cefdinir 300 MG capsule Commonly known as: OMNICEF   traMADol 50 MG tablet Commonly known as: ULTRAM       TAKE these medications    acetaminophen 650 MG CR tablet Commonly known as: TYLENOL Take 650-1,300 mg by mouth every 8 (eight) hours as needed for pain. Notes to patient: Last dose given 08/23 05:35am   aspirin EC 81 MG tablet Take 81 mg by mouth at bedtime.   docusate sodium 100 MG capsule Commonly known as: COLACE Take 1 capsule (100 mg total) by mouth 2 (two) times daily.   hydrochlorothiazide 12.5 MG capsule Commonly known as: MICROZIDE Take 1 capsule by mouth once daily   HYDROcodone-acetaminophen 5-325 MG tablet Commonly known as: Norco Take 1 tablet by mouth daily as needed for moderate pain. Notes to patient: Last dose given 08/23  09:37am   lovastatin 40 MG tablet Commonly known as: MEVACOR TAKE 1 TABLET BY MOUTH AT BEDTIME Notes to patient: Resume home regimen   methocarbamol 500 MG tablet Commonly known as: ROBAXIN Take 1 tablet (500 mg total) by mouth every 8 (eight) hours as needed for muscle spasms. Notes to patient: Last dose given 08/23 03:45am   metoprolol tartrate 100 MG tablet Commonly known as: LOPRESSOR Take 1 tablet by mouth twice daily   MULTIVITAMIN WOMEN PO Take 1 tablet by mouth in the morning. Notes to patient: Resume home regimen   Nasal Spray 0.05 % Soln Place 1 spray into the nose 2 (two) times daily as needed (congestion.). Notes to patient: Resume home regimen   tretinoin 0.1 % cream Commonly known as:  RETIN-A APPLY TO AFFECTED AREA(S) OF FACE ONE TIME DAILY AT BEDTIME USING A PEA SIZED AMOUNT What changed:  how much to take how to take this when to take this additional instructions Notes to patient: Resume home regimen   VITAMIN B-12 PO Take 1 tablet by mouth in the morning. Notes to patient: Resume home regimen        Diagnostic Studies: DG Shoulder Left Port  Result Date: 01/28/2023 CLINICAL DATA:  Status post shoulder surgery. EXAM: LEFT SHOULDER COMPARISON:  None Available. FINDINGS: Reverse left shoulder arthroplasty in expected alignment. No periprosthetic lucency or fracture. Recent postsurgical change includes air and edema in the joint space and soft tissues. Overlying skin staples in place. IMPRESSION: Reverse left shoulder arthroplasty without immediate postoperative complication. Electronically Signed   By: Narda Rutherford M.D.   On: 01/28/2023 19:09    Disposition: Discharge disposition: 01-Home or Self Care       Discharge Instructions     Call MD / Call 911   Complete by: As directed    If you experience chest pain or shortness of breath, CALL 911 and be transported to the hospital emergency room.  If you develope a fever above 101 F, pus (white drainage) or increased drainage or redness at the wound, or calf pain, call your surgeon's office.   Constipation Prevention   Complete by: As directed    Drink plenty of fluids.  Prune juice may be helpful.  You may use a stool softener, such as Colace (over the counter) 100 mg twice a day.  Use MiraLax (over the counter) for constipation as needed.   Diet - low sodium heart healthy   Complete by: As directed    Discharge instructions   Complete by: As directed    CPM machine 1 hour 3 times a day No lifting with left arm Okay to shower dressing is waterproof Return to clinic in 2 weeks Use hydrocodone but only 1 tablet a day along with other pain medicine   Increase activity slowly as tolerated   Complete by:  As directed    Post-operative opioid taper instructions:   Complete by: As directed    POST-OPERATIVE OPIOID TAPER INSTRUCTIONS: It is important to wean off of your opioid medication as soon as possible. If you do not need pain medication after your surgery it is ok to stop day one. Opioids include: Codeine, Hydrocodone(Norco, Vicodin), Oxycodone(Percocet, oxycontin) and hydromorphone amongst others.  Long term and even short term use of opiods can cause: Increased pain response Dependence Constipation Depression Respiratory depression And more.  Withdrawal symptoms can include Flu like symptoms Nausea, vomiting And more Techniques to manage these symptoms Hydrate well Eat regular healthy meals Stay active Use  relaxation techniques(deep breathing, meditating, yoga) Do Not substitute Alcohol to help with tapering If you have been on opioids for less than two weeks and do not have pain than it is ok to stop all together.  Plan to wean off of opioids This plan should start within one week post op of your joint replacement. Maintain the same interval or time between taking each dose and first decrease the dose.  Cut the total daily intake of opioids by one tablet each day Next start to increase the time between doses. The last dose that should be eliminated is the evening dose.             SignedKarenann Cai 02/05/2023, 8:15 AM

## 2023-02-07 ENCOUNTER — Encounter: Payer: Self-pay | Admitting: Orthopedic Surgery

## 2023-02-07 DIAGNOSIS — M19012 Primary osteoarthritis, left shoulder: Secondary | ICD-10-CM

## 2023-02-07 DIAGNOSIS — M7522 Bicipital tendinitis, left shoulder: Secondary | ICD-10-CM

## 2023-02-09 ENCOUNTER — Other Ambulatory Visit: Payer: Self-pay | Admitting: Surgical

## 2023-02-09 ENCOUNTER — Telehealth: Payer: Self-pay | Admitting: Surgical

## 2023-02-09 MED ORDER — GABAPENTIN 300 MG PO CAPS: 300.0000 mg | ORAL_CAPSULE | Freq: Three times a day (TID) | ORAL | 0 refills | Status: DC

## 2023-02-09 MED ORDER — LIDOCAINE 5 % EX PTCH: 1.0000 | MEDICATED_PATCH | CUTANEOUS | 0 refills | Status: DC

## 2023-02-09 NOTE — Telephone Encounter (Signed)
Pharmacy called in asking about Pt Rx and I informed her per notes and Mychart messages pt is no longer going to be using Tramadol due to effects and may be off opioids also new Rx maybe coming soon

## 2023-02-10 ENCOUNTER — Encounter: Payer: Self-pay | Admitting: Orthopedic Surgery

## 2023-02-11 ENCOUNTER — Other Ambulatory Visit (INDEPENDENT_AMBULATORY_CARE_PROVIDER_SITE_OTHER): Payer: No Typology Code available for payment source

## 2023-02-11 ENCOUNTER — Ambulatory Visit (INDEPENDENT_AMBULATORY_CARE_PROVIDER_SITE_OTHER): Payer: No Typology Code available for payment source | Admitting: Surgical

## 2023-02-11 DIAGNOSIS — Z96612 Presence of left artificial shoulder joint: Secondary | ICD-10-CM | POA: Diagnosis not present

## 2023-02-12 ENCOUNTER — Encounter: Payer: Self-pay | Admitting: Orthopedic Surgery

## 2023-02-12 NOTE — Progress Notes (Signed)
Post-Op Visit Note   Patient: [name]           Date of Birth: 05-27-1951           MRN: [id number] Visit Date: 02/11/2023 PCP: Bradd Canary, MD   Assessment & Plan:  Chief Complaint:  Chief Complaint  Patient presents with   Left Shoulder - Routine Post Op    L EFT RSA, BT (surgery date 01-28-23)   Visit Diagnoses:  1. History of arthroplasty of left shoulder     Plan: [name] is a 72 y.o. female who presents s/p left reverse shoulder arthroplasty on 01/28/2023.  Patient is doing well and pain is overall controlled.   Denies any chest pain, SOB, fevers, chills.  No complaint of any instability symptoms.  Had hallucinations with tramadol so she had to stop taking this.  She is using Voltaren gel and taking Tylenol for pain control.  Pain is steadily improving.  Does have some occasional numbness and tingling in her thumb and index finger.  On exam, patient has range of motion 15 degrees X rotation, 50 degrees abduction, 95 degrees forward elevation passively.  Intact EPL, FPL, finger abduction, finger adduction, pronation/supination, bicep, tricep, deltoid of operative extremity.  Axillary nerve intact with deltoid firing.  Incision is healing well without evidence of infection or dehiscence.  Incision was made sure to be covered with Steri-Strips from the proximal to distal aspect of the length of the incision.  2+ radial pulse of the operative extremity.  Subscap with 5 -/5 strength today.  Plan is discontinue sling.  Okay to very lightly lift with the operative extremity but no lifting anything heavier than a coffee cup or cell phone.  Start physical therapy to focus on passive range of motion and active range of motion with deltoid isometrics.  Do not want to externally rotate past 30 degrees to protect subscapularis repair.  Follow-up in 4 weeks for clinical recheck.   Follow-Up Instructions: No follow-ups on file.   Orders:  Orders Placed This Encounter   Procedures   XR Shoulder Left   Ambulatory referral to Physical Therapy   No orders of the defined types were placed in this encounter.   Imaging: No results found.  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   Transient neurologic deficit 08/18/2017   Obesity 02/08/2017   Palpitations 02/04/2017   Sleep apnea 10/01/2016   Diverticulosis 01/26/2014   Benign paroxysmal positional vertigo 11/14/2013   Urinary frequency 09/02/2013   Post-menopause 09/02/2013   H/O tobacco use, presenting hazards to health 09/02/2013   GERD (gastroesophageal reflux disease) 09/02/2013   Personal history of colonic polyps 09/02/2013   Preventative health care 11/28/2012   RHINITIS 06/27/2010   GLUCOSE INTOLERANCE 12/22/2007   Hyperlipidemia 12/20/2007   Essential hypertension, benign 12/20/2007   ARTHRITIS 12/20/2007   KNEE SPRAIN 12/20/2007   CARCINOMA, BASAL CELL, HX OF 12/20/2007   ARRHYTHMIA, HX OF 12/20/2007   GLAUCOMA, HX OF 12/20/2007   Past Medical History:  Diagnosis  Date   Anemia    Arthritis    Benign paroxysmal positional vertigo 11/14/2013   Colon polyp    Depression    Diverticulosis    Dysrhythmia    Essential hypertension, benign    GERD (gastroesophageal reflux disease) 09/02/2013   GI bleed    Glaucoma    Glucose intolerance (impaired glucose tolerance)    H/O tobacco use, presenting hazards to health 09/02/2013   History  of basal cell carcinoma    History of cardiac arrhythmia     Hyperlipidemia    Hypertension    Medicare welcome visit 11/28/2012   Obesity 02/08/2017   Palpitations 02/04/2017   Personal history of colonic polyps 09/02/2013   Patient reports H/O 2 colonoscopies, last one dates back to 2012 in [place] She reports they were labeled as incomplete.  Reports 1st colonoscopy had 1 benign polyp and the 2nd was clear.    Renal insufficiency 04/28/2021   Sleep apnea 10/01/2016   Not on CPAP machine    Stroke [facility])    UTI (urinary tract infection)     Family History  Problem Relation Age of Onset   Coronary artery disease Paternal Grandmother    Hyperlipidemia Paternal Grandmother    Hypertension Paternal Grandmother    Stroke Paternal Grandmother    Hypertension Mother    Lung cancer Mother        smoker   Uterine cancer Mother    Birth defects Mother        lung cancer in smoker   COPD Mother    Hypertension Father    Kidney disease Father        Renal Cell Carcinoma   Birth defects Father        renal cell CA, smoker   Hypertension Maternal Grandmother    Hypertension Maternal Grandfather    Hypertension Paternal Grandfather    Dementia Paternal Grandfather    Cancer Brother        testicular   Heart disease Brother        NI, smoker   Hyperlipidemia Other    Colon cancer Neg Hx    Esophageal cancer Neg Hx     Past Surgical History:  Procedure Laterality Date   BICEPT TENODESIS Left 01/28/2023   Procedure: BICEPS TENODESIS;  Surgeon: Cammy Copa, MD;  Location: [facility];  Service: Orthopedics;  Laterality: Left;   CAROTID STENT     CERVICAL CONE BIOPSY  1960s   COLONOSCOPY  2016   DILATION AND CURETTAGE OF UTERUS  1966   KNEE SURGERY Left 11/01/2008   left knee total replacement Dr Lajoyce Corners   REVERSE SHOULDER ARTHROPLASTY Left 01/28/2023   Procedure: LEFT REVERSE SHOULDER ARTHROPLASTY;  Surgeon: Cammy Copa, MD;  Location: [facility];  Service: Orthopedics;  Laterality: Left;   WISDOM TOOTH EXTRACTION     Social History    Occupational History   Occupation: Retired   Tobacco Use   Smoking status: Former    Current packs/day: 0.00    Average packs/day: 1 pack/day for 15.0 years (15.0 ttl pk-yrs)    Types: Cigarettes    Start date: 06/09/1967    Quit date: 06/08/1982    Years since quitting: 40.7   Smokeless tobacco: Never  Vaping Use   Vaping status: Never Used  Substance and Sexual Activity   Alcohol use: Yes    Comment: 1-2 glasses of wine per day   Drug use: No   Sexual activity: Not Currently  Birth control/protection: Post-menopausal    Comment: lives alone, no dietary restrictions except no lactose,

## 2023-02-19 NOTE — Therapy (Signed)
OUTPATIENT PHYSICAL THERAPY SHOULDER EVALUATION   Patient Name: [name] MRN: [id number] DOB:May 16, 1951, 72 y.o., female Today's Date: 02/24/2023   END OF SESSION:  PT End of Session - 02/24/23 1105     Visit Number 1    Date for PT Re-Evaluation 04/21/23    Authorization Type [employer]    PT Start Time 1105    PT Stop Time 1148    PT Time Calculation (min) 43 min    Activity Tolerance Patient tolerated treatment well    Behavior During Therapy WFL for tasks assessed/performed             Past Medical History:  Diagnosis Date   Anemia    Arthritis    Benign paroxysmal positional vertigo 11/14/2013   Colon polyp    Depression    Diverticulosis    Dysrhythmia    Essential hypertension, benign    GERD (gastroesophageal reflux disease) 09/02/2013   GI bleed    Glaucoma    Glucose intolerance (impaired glucose tolerance)    H/O tobacco use, presenting hazards to health 09/02/2013   History  of basal cell carcinoma    History of cardiac arrhythmia    Hyperlipidemia    Hypertension    Medicare welcome visit 11/28/2012   Obesity 02/08/2017   Palpitations 02/04/2017   Personal history of colonic polyps 09/02/2013   Patient reports H/O 2 colonoscopies, last one dates back to 2012 in [place] She reports they were labeled as incomplete.  Reports 1st colonoscopy had 1 benign polyp and the 2nd was clear.    Renal insufficiency 04/28/2021   Sleep apnea 10/01/2016   Not on CPAP machine    Stroke [facility])    UTI (urinary tract infection)    Past Surgical History:  Procedure Laterality Date   BICEPT TENODESIS Left 01/28/2023   Procedure: BICEPS TENODESIS;  Surgeon: Cammy Copa, MD;  Location: [facility];  Service: Orthopedics;  Laterality: Left;   CAROTID STENT     CERVICAL CONE BIOPSY  1960s   COLONOSCOPY  2016   DILATION AND CURETTAGE OF UTERUS  1966   KNEE SURGERY Left 11/01/2008   left knee total replacement Dr Lajoyce Corners   REVERSE SHOULDER ARTHROPLASTY Left  01/28/2023   Procedure: LEFT REVERSE SHOULDER ARTHROPLASTY;  Surgeon: Cammy Copa, MD;  Location: [facility];  Service: Orthopedics;  Laterality: Left;   WISDOM TOOTH EXTRACTION     Patient Active Problem List   Diagnosis Date Noted   Arthritis of left shoulder region 02/07/2023   Biceps tendonitis, left 02/07/2023   S/P reverse total shoulder arthroplasty, left 01/28/2023   Cervical radiculopathy 08/12/2022   Cough 08/08/2021   Myalgia 04/28/2021   Renal insufficiency 04/28/2021   COVID-19 02/16/2021   Colon polyps 07/25/2019   Constipation 07/25/2019   Carotid arterial disease (HCC) 11/22/2018   Chronic left-sided thoracic back pain 01/02/2018   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   Transient neurologic deficit 08/18/2017   Obesity 02/08/2017   Palpitations 02/04/2017   Sleep apnea 10/01/2016   Diverticulosis 01/26/2014   Benign paroxysmal positional vertigo 11/14/2013   Urinary frequency 09/02/2013   Post-menopause 09/02/2013   H/O tobacco use, presenting hazards to health 09/02/2013   GERD (gastroesophageal reflux disease) 09/02/2013   Personal history of colonic polyps 09/02/2013   Preventative health care 11/28/2012   RHINITIS 06/27/2010   GLUCOSE INTOLERANCE 12/22/2007   Hyperlipidemia 12/20/2007   Essential hypertension, benign 12/20/2007   ARTHRITIS 12/20/2007   KNEE SPRAIN 12/20/2007  CARCINOMA, BASAL CELL, HX OF 12/20/2007   ARRHYTHMIA, HX OF 12/20/2007   GLAUCOMA, HX OF 12/20/2007    PCP: Bradd Canary, MD   REFERRING PROVIDER: Julieanne Cotton, PA-C   REFERRING DIAG: [phone] (ICD-10-CM) - History of arthroplasty of left shoulder   THERAPY DIAG:  Stiffness of left shoulder, not elsewhere classified  Acute pain of left shoulder  Muscle weakness (generalized)  Abnormal posture  RATIONALE FOR EVALUATION AND TREATMENT: Rehabilitation  ONSET DATE: 01/28/2023 - L shoulder reverse TSA with biceps tenodesis Cammy Copa, MD)  NEXT MD VISIT:  03/11/2023   SUBJECTIVE:                                                                                                                                                                                                         SUBJECTIVE STATEMENT: Pt reports the first few weeks after surgery were terrible but it has been much better since the post-op f/u visit on 02/11/23 when the bandages were removed and the sling discontinued. MD wants her to limit lifting to nothing heavy than her cell phone but otherwise she does not recall any restrictions.  PAIN: Are you having pain? No and Yes: NPRS scale: 0/10 currently, up to 2-3/10 Pain location: L upper shoulder and clavicle when present Pain description: nagging ache Aggravating factors: "overdoing things" when she forgets and tries to use the L arm too much Relieving factors: Voltaren cream, Tylenol  PERTINENT HISTORY:  OA, L TKR 2010, Hypertension, carotid arterial disease, cervical radiculopathy, chronic L-sided thoracic back pain, BPPV, sleep apnea, GERD, depression  PRECAUTIONS: Shoulder - As of 02/11/23: Discontinue sling. Okay to very lightly lift with the operative extremity but no lifting anything heavier than a coffee cup or cell phone. Start physical therapy to focus on passive range of motion and active range of motion with deltoid isometrics. Do not want to externally rotate past 30 degrees to protect subscapularis repair.    RED FLAGS: None  HAND DOMINANCE: Right  WEIGHT BEARING RESTRICTIONS: No  FALLS:  Has patient fallen in last 6 months? No  LIVING ENVIRONMENT: Lives with: lives alone Lives in: House/apartment Stairs: Yes: External: 3 steps; bilateral but cannot reach both Has following equipment at home: None  OCCUPATION: Retired  PLOF: Independent and Leisure: minimal activity since January d/t L shoulder pain, gardening, housekeeping, no regular exercise  PATIENT GOALS: "functional use of L arm"    OBJECTIVE:  (objective measures completed at initial evaluation unless otherwise dated)  DIAGNOSTIC FINDINGS:  02/11/23 - XR Left shoulder: AP, scapular  Y, axillary views of the shoulder reviewed.  Reverse shoulder arthroplasty prosthesis in good position and alignment without any complicating features.  There is no evidence of periprosthetic fracture, dislocation, dissociation of the glenosphere.   PATIENT SURVEYS:  Quick Dash 47.7 / 100 = 47.7 %  COGNITION: Overall cognitive status: Within functional limits for tasks assessed     SENSATION: WFL Occasional numbness in L forefinger and thumb - less common recently  POSTURE: rounded shoulders, forward head, and mildly depressed L shoulder  UPPER EXTREMITY ROM:   Passive ROM Left eval  Shoulder flexion 120  Shoulder extension   Shoulder abduction 94  Shoulder adduction   Shoulder internal rotation 50  Shoulder external rotation 30  (Blank rows = not tested)  Active ROM Right eval Left eval  Shoulder flexion 131 92  Shoulder extension 50 20  Shoulder abduction 129 78  Shoulder adduction    Shoulder internal rotation    Shoulder external rotation    (Blank rows = not tested)  UPPER EXTREMITY MMT: (Deferred on eval)  MMT Right eval Left eval  Shoulder flexion    Shoulder extension    Shoulder abduction    Shoulder adduction    Shoulder internal rotation    Shoulder external rotation    Middle trapezius    Lower trapezius    Elbow flexion    Elbow extension    Wrist flexion    Wrist extension    Wrist ulnar deviation    Wrist radial deviation    Wrist pronation    Wrist supination    Grip strength (lbs)    (Blank rows = not tested)  JOINT MOBILITY TESTING:  Good tolerance for PROM with mild pain at end ROM but no significant muscle guarding  PALPATION:  Mildly TTP over surgical incision sites    TODAY'S TREATMENT:   02/24/23 - Eval THERAPEUTIC EXERCISE: to improve flexibility, strength and mobility.  Demonstration,  verbal and tactile cues throughout for technique.  Review of shoulder pendulum exercises Pulleys: Flexion and scaption x 1 minute each with demonstration of home pulley set up  L shoulder table slides - flexion and scaption x 10 each; instruction provided on how to duplicate motion using Swiffer as UE Ranger L shoulder submax isometrics against folded towel on wall/door frame 5 x 5" each Flexion Abduction Extension - cues to keep back snug against wall to isolate posterior deltoid without moving into extension ROM IR ER  MANUAL THERAPY: To promote normalized muscle tension, improved flexibility, improved joint mobility, increased ROM, and reduced pain. L shoulder PROM tolerance (ER limited to 30)   PATIENT EDUCATION:  Education details: PT eval findings, anticipated POC, and initial HEP  Person educated: Patient Education method: Explanation, Demonstration, Tactile cues, Verbal cues, and Handouts Education comprehension: verbalized understanding, returned demonstration, verbal cues required, tactile cues required, and needs further education  HOME EXERCISE PROGRAM: Access Code: [email or web address] URL: [email or web address] Date: 02/24/2023 Prepared by: Glenetta Hew  Exercises - Seated Shoulder Flexion AAROM with Pulley Behind  - 2-3 x daily - 7 x weekly - 2 sets - 10 reps - 3 sec hold - Seated Shoulder Scaption AAROM with Pulley at Side  - 2-3 x daily - 7 x weekly - 2 sets - 10 reps - 3 sec hold - Seated Shoulder Flexion Towel Slide at Table Top (Mirrored)  - 2-3 x daily - 7 x weekly - 2 sets - 10 reps - 3 sec  hold - Seated Shoulder Scaption Slide at Table  Top with Forearm in Neutral  - 2-3 x daily - 7 x weekly - 2 sets - 10 reps - 3 sec hold - Isometric Shoulder Flexion at Wall  - 2 x daily - 7 x weekly - 2 sets - 5 reps - 5 sec hold - Isometric Shoulder Abduction at Wall  - 2 x daily - 7 x weekly - 2 sets - 5 reps - 5 sec hold - Isometric Shoulder Extension at Wall  - 2 x  daily - 7 x weekly - 2 sets - 5 reps - 5 sec hold - Standing Isometric Shoulder Internal Rotation at Doorway (Mirrored)  - 2 x daily - 7 x weekly - 2 sets - 5 reps - 5 sec hold - Standing Isometric Shoulder External Rotation with Doorway  - 2 x daily - 7 x weekly - 2 sets - 5 reps - 5 sec hold   ASSESSMENT:  CLINICAL IMPRESSION: [name] is a 72 y.o. female who was referred to physical therapy for evaluation and treatment s/p L reverse TSA with biceps tenodesis on 01/28/23.  She reports a lot of pain initially which seems to have subsided since postop follow-up visit on 02/11/23 where bandages were removed and sling discontinued.  Current reported pain is minimal, often 0/10 at rest and typically only 2-3/10 with movement or activity.  Current deficits include abnormal posture, limited L shoulder AROM and PROM, L UE weakness, and limited functional use of L UE with daily activities and ADLs.  Reviewed precautions for current phase of rehab emphasizing avoidance of L shoulder extension and limiting L shoulder ER, as well as limiting what she lifts with her L hand.  Reviewed current home exercises including pendulum exercises and progressed HEP to include pulley assisted and table slides/Swiffer AAROM as well as introduced submaximal L shoulder isometrics.  Patient cautioned to avoid pushing exercises/ROM to the point of pain.  Rachel "Lavonna Rua" will benefit from skilled PT to address above deficits to improve mobility and activity tolerance with decreased pain interference.   OBJECTIVE IMPAIRMENTS: decreased activity tolerance, decreased knowledge of condition, decreased ROM, decreased strength, increased fascial restrictions, impaired perceived functional ability, increased muscle spasms, impaired flexibility, impaired UE functional use, improper body mechanics, postural dysfunction, and pain.   ACTIVITY LIMITATIONS: carrying, lifting, bed mobility, bathing, dressing, and reach over head  PARTICIPATION  LIMITATIONS: meal prep, cleaning, laundry, driving, shopping, and community activity  PERSONAL FACTORS: Age, Fitness, Past/current experiences, Time since onset of injury/illness/exacerbation, and 3+ comorbidities: OA, L TKR 2010, Hypertension, carotid arterial disease, cervical radiculopathy, chronic L-sided thoracic back pain, BPPV, sleep apnea, GERD, depression  are also affecting patient's functional outcome.   REHAB POTENTIAL: Good  CLINICAL DECISION MAKING: Stable/uncomplicated  EVALUATION COMPLEXITY: Low   GOALS: Goals reviewed with patient? Yes  SHORT TERM GOALS: Target date: 03/24/2023  Patient will be independent with initial HEP to improve outcomes and carryover.  Baseline: Initial HEP provided on eval Goal status: INITIAL  2.  Patient will demonstrate ability to isometrically activate all components of the deltoid and periscapular muscles in the scapular plane. Baseline:  Goal status: INITIAL  LONG TERM GOALS: Target date: 04/21/2023  Patient will be independent with ongoing/advanced HEP for self-management at home.  Baseline:  Goal status: INITIAL  2.  Patient will report 75% improvement in L shoulder pain to improve QOL.  Baseline: 2-3/10 Goal status: INITIAL  3.  Patient to demonstrate improved upright posture with posterior shoulder girdle engaged to promote improved glenohumeral  joint mobility. Baseline: Mildly rounded/forward L shoulder with slight shoulder depression Goal status: INITIAL  4.  Patient to improve L shoulder shoulder AROM to [facility] without pain provocation to allow for increased ease of ADLs.  Baseline: Refer to above UE ROM table Goal status: INITIAL  5.  Patient will demonstrate improved L shoulder strength to >/= 4/5 for functional UE use. Baseline: MMT deferred Goal status: INITIAL  6  Patient will report </= 37% on QuickDASH to demonstrate improved functional ability.  Baseline:  47.7 / 100 = 47.7 % Goal status: INITIAL  7.  Patient  to report ability to perform ADLs, household, and leisure activities without limitation due to L shoulder pain, LOM or weakness.   Baseline:  Goal status: INITIAL    PLAN:  PT FREQUENCY: 1-2x/week - Pt wishing to try only 1x/week due to $40 co-pay  PT DURATION: 8 weeks  PLANNED INTERVENTIONS: Therapeutic exercises, Therapeutic activity, Neuromuscular re-education, Patient/Family education, Self Care, Joint mobilization, Dry Needling, Electrical stimulation, Cryotherapy, Moist heat, Taping, Vasopneumatic device, Ultrasound, Ionotophoresis 4mg /ml Dexamethasone, Manual therapy, and Re-evaluation  PLAN FOR NEXT SESSION: Per reverse TSA protocol - Sx 01/28/23 - post-op week #4 as of 02/25/23: P/AAROM L shoulder (Per PA on 02/11/23: Do not want to externally rotate past 30 degrees to protect subscapularis repair), L shoulder submax deltoid isometrics, gentle distal UE ROM and strengthening   Marry Guan, PT 02/24/2023, 1:17 PM  [facility] [address]  [address] [place], [place], [address] Phone: [phone]   Fax:  [phone]

## 2023-02-24 ENCOUNTER — Other Ambulatory Visit: Payer: Self-pay

## 2023-02-24 ENCOUNTER — Ambulatory Visit: Payer: No Typology Code available for payment source | Attending: Surgical | Admitting: Physical Therapy

## 2023-02-24 ENCOUNTER — Encounter: Payer: Self-pay | Admitting: Physical Therapy

## 2023-02-24 DIAGNOSIS — M25612 Stiffness of left shoulder, not elsewhere classified: Secondary | ICD-10-CM

## 2023-02-24 DIAGNOSIS — R293 Abnormal posture: Secondary | ICD-10-CM | POA: Diagnosis not present

## 2023-02-24 DIAGNOSIS — Z96612 Presence of left artificial shoulder joint: Secondary | ICD-10-CM | POA: Insufficient documentation

## 2023-02-24 DIAGNOSIS — M6281 Muscle weakness (generalized): Secondary | ICD-10-CM | POA: Diagnosis not present

## 2023-02-24 DIAGNOSIS — M25512 Pain in left shoulder: Secondary | ICD-10-CM

## 2023-03-03 ENCOUNTER — Ambulatory Visit: Payer: No Typology Code available for payment source

## 2023-03-03 DIAGNOSIS — M25612 Stiffness of left shoulder, not elsewhere classified: Secondary | ICD-10-CM | POA: Diagnosis not present

## 2023-03-03 DIAGNOSIS — R293 Abnormal posture: Secondary | ICD-10-CM

## 2023-03-03 DIAGNOSIS — M25512 Pain in left shoulder: Secondary | ICD-10-CM

## 2023-03-03 DIAGNOSIS — M6281 Muscle weakness (generalized): Secondary | ICD-10-CM

## 2023-03-03 NOTE — Therapy (Signed)
OUTPATIENT PHYSICAL THERAPY SHOULDER TREATMNET   Patient Name: [name] MRN: [id number] DOB:10-31-1950, 72 y.o., female Today's Date: 03/03/2023   END OF SESSION:  PT End of Session - 03/03/23 1403     Visit Number 2    Date for PT Re-Evaluation 04/21/23    Authorization Type [employer]    PT Start Time 1316    PT Stop Time 1400    PT Time Calculation (min) 44 min    Activity Tolerance Patient tolerated treatment well    Behavior During Therapy [facility] for tasks assessed/performed              Past Medical History:  Diagnosis Date   Anemia    Arthritis    Benign paroxysmal positional vertigo 11/14/2013   Colon polyp    Depression    Diverticulosis    Dysrhythmia    Essential hypertension, benign    GERD (gastroesophageal reflux disease) 09/02/2013   GI bleed    Glaucoma    Glucose intolerance (impaired glucose tolerance)    H/O tobacco use, presenting hazards to health 09/02/2013   History  of basal cell carcinoma    History of cardiac arrhythmia    Hyperlipidemia    Hypertension    Medicare welcome visit 11/28/2012   Obesity 02/08/2017   Palpitations 02/04/2017   Personal history of colonic polyps 09/02/2013   Patient reports H/O 2 colonoscopies, last one dates back to 2012 in [place] She reports they were labeled as incomplete.  Reports 1st colonoscopy had 1 benign polyp and the 2nd was clear.    Renal insufficiency 04/28/2021   Sleep apnea 10/01/2016   Not on CPAP machine    Stroke [facility])    UTI (urinary tract infection)    Past Surgical History:  Procedure Laterality Date   BICEPT TENODESIS Left 01/28/2023   Procedure: BICEPS TENODESIS;  Surgeon: Cammy Copa, MD;  Location: [facility];  Service: Orthopedics;  Laterality: Left;   CAROTID STENT     CERVICAL CONE BIOPSY  1960s   COLONOSCOPY  2016   DILATION AND CURETTAGE OF UTERUS  1966   KNEE SURGERY Left 11/01/2008   left knee total replacement Dr Lajoyce Corners   REVERSE SHOULDER ARTHROPLASTY Left  01/28/2023   Procedure: LEFT REVERSE SHOULDER ARTHROPLASTY;  Surgeon: Cammy Copa, MD;  Location: [facility];  Service: Orthopedics;  Laterality: Left;   WISDOM TOOTH EXTRACTION     Patient Active Problem List   Diagnosis Date Noted   Arthritis of left shoulder region 02/07/2023   Biceps tendonitis, left 02/07/2023   S/P reverse total shoulder arthroplasty, left 01/28/2023   Cervical radiculopathy 08/12/2022   Cough 08/08/2021   Myalgia 04/28/2021   Renal insufficiency 04/28/2021   COVID-19 02/16/2021   Colon polyps 07/25/2019   Constipation 07/25/2019   Carotid arterial disease (HCC) 11/22/2018   Chronic left-sided thoracic back pain 01/02/2018   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   Transient neurologic deficit 08/18/2017   Obesity 02/08/2017   Palpitations 02/04/2017   Sleep apnea 10/01/2016   Diverticulosis 01/26/2014   Benign paroxysmal positional vertigo 11/14/2013   Urinary frequency 09/02/2013   Post-menopause 09/02/2013   H/O tobacco use, presenting hazards to health 09/02/2013   GERD (gastroesophageal reflux disease) 09/02/2013   Personal history of colonic polyps 09/02/2013   Preventative health care 11/28/2012   RHINITIS 06/27/2010   GLUCOSE INTOLERANCE 12/22/2007   Hyperlipidemia 12/20/2007   Essential hypertension, benign 12/20/2007   ARTHRITIS 12/20/2007   KNEE SPRAIN 12/20/2007  CARCINOMA, BASAL CELL, HX OF 12/20/2007   ARRHYTHMIA, HX OF 12/20/2007   GLAUCOMA, HX OF 12/20/2007    PCP: Bradd Canary, MD   REFERRING PROVIDER: Julieanne Cotton, PA-C   REFERRING DIAG: [phone] (ICD-10-CM) - History of arthroplasty of left shoulder   THERAPY DIAG:  Stiffness of left shoulder, not elsewhere classified  Acute pain of left shoulder  Muscle weakness (generalized)  Abnormal posture  RATIONALE FOR EVALUATION AND TREATMENT: Rehabilitation  ONSET DATE: 01/28/2023 - L shoulder reverse TSA with biceps tenodesis Cammy Copa, MD)  NEXT MD VISIT:  03/11/2023   SUBJECTIVE:                                                                                                                                                                                                         SUBJECTIVE STATEMENT: Pt reports her shoulder is achy in the am, she is using it a lot at home.  PAIN: Are you having pain? No and Yes: NPRS scale: 0, 2-3 when moving/10 Pain location: L upper shoulder and clavicle when present Pain description: nagging ache Aggravating factors: "overdoing things" when she forgets and tries to use the L arm too much Relieving factors: Voltaren cream, Tylenol  PERTINENT HISTORY:  OA, L TKR 2010, Hypertension, carotid arterial disease, cervical radiculopathy, chronic L-sided thoracic back pain, BPPV, sleep apnea, GERD, depression  PRECAUTIONS: Shoulder - As of 02/11/23: Discontinue sling. Okay to very lightly lift with the operative extremity but no lifting anything heavier than a coffee cup or cell phone. Start physical therapy to focus on passive range of motion and active range of motion with deltoid isometrics. Do not want to externally rotate past 30 degrees to protect subscapularis repair.    RED FLAGS: None  HAND DOMINANCE: Right  WEIGHT BEARING RESTRICTIONS: No  FALLS:  Has patient fallen in last 6 months? No  LIVING ENVIRONMENT: Lives with: lives alone Lives in: House/apartment Stairs: Yes: External: 3 steps; bilateral but cannot reach both Has following equipment at home: None  OCCUPATION: Retired  PLOF: Independent and Leisure: minimal activity since January d/t L shoulder pain, gardening, housekeeping, no regular exercise  PATIENT GOALS: "functional use of L arm"    OBJECTIVE: (objective measures completed at initial evaluation unless otherwise dated)  DIAGNOSTIC FINDINGS:  02/11/23 - XR Left shoulder: AP, scapular Y, axillary views of the shoulder reviewed.  Reverse shoulder arthroplasty prosthesis in good  position and alignment without any complicating features.  There is no evidence of periprosthetic fracture, dislocation, dissociation of the glenosphere.   PATIENT  SURVEYS:  Quick Dash 47.7 / 100 = 47.7 %  COGNITION: Overall cognitive status: Within functional limits for tasks assessed     SENSATION: WFL Occasional numbness in L forefinger and thumb - less common recently  POSTURE: rounded shoulders, forward head, and mildly depressed L shoulder  UPPER EXTREMITY ROM:   Passive ROM Left eval  Shoulder flexion 120  Shoulder extension   Shoulder abduction 94  Shoulder adduction   Shoulder internal rotation 50  Shoulder external rotation 30  (Blank rows = not tested)  Active ROM Right eval Left eval  Shoulder flexion 131 92  Shoulder extension 50 20  Shoulder abduction 129 78  Shoulder adduction    Shoulder internal rotation    Shoulder external rotation    (Blank rows = not tested)  UPPER EXTREMITY MMT: (Deferred on eval)  MMT Right eval Left eval  Shoulder flexion    Shoulder extension    Shoulder abduction    Shoulder adduction    Shoulder internal rotation    Shoulder external rotation    Middle trapezius    Lower trapezius    Elbow flexion    Elbow extension    Wrist flexion    Wrist extension    Wrist ulnar deviation    Wrist radial deviation    Wrist pronation    Wrist supination    Grip strength (lbs)    (Blank rows = not tested)  JOINT MOBILITY TESTING:  Good tolerance for PROM with mild pain at end ROM but no significant muscle guarding  PALPATION:  Mildly TTP over surgical incision sites    TODAY'S TREATMENT:  03/03/23 THERAPEUTIC EXERCISE: to improve flexibility, strength and mobility.  Demonstration, verbal and tactile cues throughout for technique.  Shoulder pulleys 3 min flexion  L shoulder isometrics: flexion, ext, ABD, ER, IR 5x5" Supine shoulder flexion, abduction, ER  AAROM with cane x 10 PROM flex, ABD, ER  Seated scap retraction  and shoulder rolls 10x  02/24/23 - Eval THERAPEUTIC EXERCISE: to improve flexibility, strength and mobility.  Demonstration, verbal and tactile cues throughout for technique.  Review of shoulder pendulum exercises Pulleys: Flexion and scaption x 1 minute each with demonstration of home pulley set up  L shoulder table slides - flexion and scaption x 10 each; instruction provided on how to duplicate motion using Swiffer as UE Ranger L shoulder submax isometrics against folded towel on wall/door frame 5 x 5" each Flexion Abduction Extension - cues to keep back snug against wall to isolate posterior deltoid without moving into extension ROM IR ER  MANUAL THERAPY: To promote normalized muscle tension, improved flexibility, improved joint mobility, increased ROM, and reduced pain. L shoulder PROM tolerance (ER limited to 30)   PATIENT EDUCATION:  Education details: PT eval findings, anticipated POC, and initial HEP  Person educated: Patient Education method: Explanation, Demonstration, Tactile cues, Verbal cues, and Handouts Education comprehension: verbalized understanding, returned demonstration, verbal cues required, tactile cues required, and needs further education  HOME EXERCISE PROGRAM: Access Code: [email or web address] URL: [email or web address] Date: 03/03/2023 Prepared by: Verta Ellen  Exercises - Seated Shoulder Flexion AAROM with Pulley Behind  - 2-3 x daily - 7 x weekly - 2 sets - 10 reps - 3 sec hold - Seated Shoulder Scaption AAROM with Pulley at Side  - 2-3 x daily - 7 x weekly - 2 sets - 10 reps - 3 sec hold - Seated Shoulder Flexion Towel Slide at Table Top (Mirrored)  - 2-3 x daily - 7  x weekly - 2 sets - 10 reps - 3 sec  hold - Seated Shoulder Scaption Slide at Table Top with Forearm in Neutral  - 2-3 x daily - 7 x weekly - 2 sets - 10 reps - 3 sec hold - Isometric Shoulder Flexion at Wall  - 2 x daily - 7 x weekly - 2 sets - 5 reps - 5 sec hold - Isometric  Shoulder Abduction at Wall  - 2 x daily - 7 x weekly - 2 sets - 5 reps - 5 sec hold - Isometric Shoulder Extension at Wall  - 2 x daily - 7 x weekly - 2 sets - 5 reps - 5 sec hold - Standing Isometric Shoulder Internal Rotation at Doorway (Mirrored)  - 2 x daily - 7 x weekly - 2 sets - 5 reps - 5 sec hold - Standing Isometric Shoulder External Rotation with Doorway  - 2 x daily - 7 x weekly - 2 sets - 5 reps - 5 sec hold - Supine Shoulder Flexion Extension AAROM with Dowel  - 2 x daily - 7 x weekly - 2 sets - 10 reps - Supine Shoulder Scaption with Dowel  - 2 x daily - 7 x weekly - 2 sets - 10 reps - Supine Shoulder External Rotation in 45 Degrees Abduction AAROM with Dowel  - 2 x daily - 7 x weekly - 2 sets - 10 reps - Seated Scapular Retraction  - 2 x daily - 7 x weekly - 2 sets - 10 reps - Seated Shoulder Rolls  - 2 x daily - 7 x weekly - 2 sets - 10 reps   ASSESSMENT:  CLINICAL IMPRESSION: Pt wishes to  stop therapy for 1x per week d/t $40 co-pay. After conversation with PT she was agreeable to 3 week F/U for check up. Progressed HEP and reviewed post surgical precautions for now and 6 weeks post op according to protocol. Pt needs constant reinforcements to avoid pushing to painful ROM. She will return in ~3 weeks for F/U. Rachel "Lavonna Rua" will benefit from skilled PT to address above deficits to improve mobility and activity tolerance with decreased pain interference.   OBJECTIVE IMPAIRMENTS: decreased activity tolerance, decreased knowledge of condition, decreased ROM, decreased strength, increased fascial restrictions, impaired perceived functional ability, increased muscle spasms, impaired flexibility, impaired UE functional use, improper body mechanics, postural dysfunction, and pain.   ACTIVITY LIMITATIONS: carrying, lifting, bed mobility, bathing, dressing, and reach over head  PARTICIPATION LIMITATIONS: meal prep, cleaning, laundry, driving, shopping, and community activity  PERSONAL  FACTORS: Age, Fitness, Past/current experiences, Time since onset of injury/illness/exacerbation, and 3+ comorbidities: OA, L TKR 2010, Hypertension, carotid arterial disease, cervical radiculopathy, chronic L-sided thoracic back pain, BPPV, sleep apnea, GERD, depression  are also affecting patient's functional outcome.   REHAB POTENTIAL: Good  CLINICAL DECISION MAKING: Stable/uncomplicated  EVALUATION COMPLEXITY: Low   GOALS: Goals reviewed with patient? Yes  SHORT TERM GOALS: Target date: 03/24/2023  Patient will be independent with initial HEP to improve outcomes and carryover.  Baseline: Initial HEP provided on eval Goal status: IN PROGRESS  2.  Patient will demonstrate ability to isometrically activate all components of the deltoid and periscapular muscles in the scapular plane. Baseline:  Goal status: IN PROGRESS  LONG TERM GOALS: Target date: 04/21/2023  Patient will be independent with ongoing/advanced HEP for self-management at home.  Baseline:  Goal status: IN PROGRESS  2.  Patient will report 75% improvement in L shoulder pain to  improve QOL.  Baseline: 2-3/10 Goal status: IN PROGRESS  3.  Patient to demonstrate improved upright posture with posterior shoulder girdle engaged to promote improved glenohumeral joint mobility. Baseline: Mildly rounded/forward L shoulder with slight shoulder depression Goal status: IN PROGRESS  4.  Patient to improve L shoulder shoulder AROM to [facility] without pain provocation to allow for increased ease of ADLs.  Baseline: Refer to above UE ROM table Goal status: IN PROGRESS  5.  Patient will demonstrate improved L shoulder strength to >/= 4/5 for functional UE use. Baseline: MMT deferred Goal status: IN PROGRESS  6  Patient will report </= 37% on QuickDASH to demonstrate improved functional ability.  Baseline:  47.7 / 100 = 47.7 % Goal status: IN PROGRESS  7.  Patient to report ability to perform ADLs, household, and leisure  activities without limitation due to L shoulder pain, LOM or weakness.   Baseline:  Goal status: IN PROGRESS    PLAN:  PT FREQUENCY: 1-2x/week - Pt wishing to try only 1x/week due to $40 co-pay  PT DURATION: 8 weeks  PLANNED INTERVENTIONS: Therapeutic exercises, Therapeutic activity, Neuromuscular re-education, Patient/Family education, Self Care, Joint mobilization, Dry Needling, Electrical stimulation, Cryotherapy, Moist heat, Taping, Vasopneumatic device, Ultrasound, Ionotophoresis 4mg /ml Dexamethasone, Manual therapy, and Re-evaluation  PLAN FOR NEXT SESSION: Per reverse TSA protocol - Sx 01/28/23 - post-op week #4 as of 02/25/23: P/AAROM L shoulder (Per PA on 02/11/23: Do not want to externally rotate past 30 degrees to protect subscapularis repair), L shoulder submax deltoid isometrics, gentle distal UE ROM and strengthening   Darleene Cleaver, PTA 03/03/2023, 2:19 PM  [facility] at [facility] [address]  [address] [place], [place], [address] Phone: [phone]   Fax:  [phone]

## 2023-03-11 ENCOUNTER — Other Ambulatory Visit: Payer: Self-pay

## 2023-03-11 ENCOUNTER — Encounter: Payer: Self-pay | Admitting: Orthopedic Surgery

## 2023-03-11 ENCOUNTER — Ambulatory Visit: Payer: No Typology Code available for payment source | Admitting: Orthopedic Surgery

## 2023-03-11 DIAGNOSIS — Z96612 Presence of left artificial shoulder joint: Secondary | ICD-10-CM

## 2023-03-11 MED ORDER — LOVASTATIN 40 MG PO TABS: 40.0000 mg | ORAL_TABLET | Freq: Every day | ORAL | 0 refills | Status: DC

## 2023-03-11 NOTE — Progress Notes (Signed)
Post-Op Visit Note   Patient: [name]           Date of Birth: 1950-09-30           MRN: [id number] Visit Date: 03/11/2023 PCP: Bradd Canary, MD   Assessment & Plan:  Chief Complaint:  Chief Complaint  Patient presents with   Left Shoulder - Routine Post Op      L EFT RSA, BT (surgery date 01-28-23)     Visit Diagnoses:  1. History of arthroplasty of left shoulder     Plan: Patient presents now 6 weeks out left reverse shoulder replacement.  Patient is doing well.  Not taking any pain meds.  Stopped physical therapy and is doing home exercise program.  On examination her range of motion is 30/90/140.  Subscap strength intact.  External rotation strength also 5- out of 5.  Plan is to continue with home exercise program and follow-up in 6 weeks for final check.  Follow-Up Instructions: No follow-ups on file.   Orders:  No orders of the defined types were placed in this encounter.  No orders of the defined types were placed in this encounter.   Imaging: No results found.  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   Transient neurologic deficit 08/18/2017   Obesity 02/08/2017   Palpitations 02/04/2017   Sleep apnea 10/01/2016   Diverticulosis 01/26/2014   Benign paroxysmal positional vertigo 11/14/2013   Urinary frequency 09/02/2013   Post-menopause 09/02/2013   H/O tobacco use, presenting hazards to health 09/02/2013   GERD (gastroesophageal reflux disease) 09/02/2013   History  of colonic polyps 09/02/2013   Preventative health care 11/28/2012   RHINITIS 06/27/2010   GLUCOSE INTOLERANCE 12/22/2007   Hyperlipidemia 12/20/2007   Essential hypertension, benign 12/20/2007   ARTHRITIS 12/20/2007   KNEE SPRAIN 12/20/2007   CARCINOMA, BASAL CELL, HX OF 12/20/2007   ARRHYTHMIA, HX OF 12/20/2007   GLAUCOMA, HX OF 12/20/2007   Past Medical History:  Diagnosis Date   Anemia    Arthritis    Benign paroxysmal positional vertigo 11/14/2013   Colon polyp    Depression    Diverticulosis    Dysrhythmia    Essential hypertension, benign    GERD (gastroesophageal reflux disease) 09/02/2013   GI bleed    Glaucoma    Glucose intolerance (impaired glucose tolerance)    H/O tobacco use, presenting hazards to health 09/02/2013   History  of basal cell carcinoma    History of cardiac arrhythmia    Hyperlipidemia    Hypertension    Medicare welcome visit 11/28/2012   Obesity 02/08/2017   Palpitations 02/04/2017   Personal history of colonic polyps 09/02/2013   Patient reports H/O 2 colonoscopies, last one dates back to 2012 in [place] She reports they were labeled as incomplete.  Reports 1st colonoscopy had 1 benign polyp and the 2nd was clear.    Renal insufficiency 04/28/2021   Sleep apnea 10/01/2016   Not on CPAP machine    Stroke [facility])    UTI (  urinary tract infection)     Family History  Problem Relation Age of Onset   Coronary artery disease Paternal Grandmother    Hyperlipidemia Paternal Grandmother    Hypertension Paternal Grandmother    Stroke Paternal Grandmother    Hypertension Mother    Lung cancer Mother        smoker   Uterine cancer Mother    Birth defects Mother        lung cancer in smoker   COPD Mother    Hypertension Father    Kidney disease Father        Renal Cell Carcinoma   Birth defects Father        renal cell CA, smoker   Hypertension Maternal Grandmother    Hypertension Maternal Grandfather    Hypertension Paternal Grandfather     Dementia Paternal Grandfather    Cancer Brother        testicular   Heart disease Brother        NI, smoker   Hyperlipidemia Other    Colon cancer Neg Hx    Esophageal cancer Neg Hx     Past Surgical History:  Procedure Laterality Date   BICEPT TENODESIS Left 01/28/2023   Procedure: BICEPS TENODESIS;  Surgeon: Cammy Copa, MD;  Location: [facility];  Service: Orthopedics;  Laterality: Left;   CAROTID STENT     CERVICAL CONE BIOPSY  1960s   COLONOSCOPY  2016   DILATION AND CURETTAGE OF UTERUS  1966   KNEE SURGERY Left 11/01/2008   left knee total replacement Dr Lajoyce Corners   REVERSE SHOULDER ARTHROPLASTY Left 01/28/2023   Procedure: LEFT REVERSE SHOULDER ARTHROPLASTY;  Surgeon: Cammy Copa, MD;  Location: [facility];  Service: Orthopedics;  Laterality: Left;   WISDOM TOOTH EXTRACTION     Social History   Occupational History   Occupation: Retired   Tobacco Use   Smoking status: Former    Current packs/day: 0.00    Average packs/day: 1 pack/day for 15.0 years (15.0 ttl pk-yrs)    Types: Cigarettes    Start date: 06/09/1967    Quit date: 06/08/1982    Years since quitting: 40.7   Smokeless tobacco: Never  Vaping Use   Vaping status: Never Used  Substance and Sexual Activity   Alcohol use: Yes    Comment: 1-2 glasses of wine per day   Drug use: No   Sexual activity: Not Currently    Birth control/protection: Post-menopausal    Comment: lives alone, no dietary restrictions except no lactose,

## 2023-03-12 ENCOUNTER — Telehealth: Payer: Self-pay

## 2023-03-12 NOTE — Telephone Encounter (Signed)
-----   Message from Burnard Bunting sent at 03/11/2023  9:48 PM EDT ----- Pls fu luke 6 w thx

## 2023-03-12 NOTE — Telephone Encounter (Signed)
Please call and schedule 6wk follow up with [facility].

## 2023-03-17 ENCOUNTER — Encounter: Payer: No Typology Code available for payment source | Admitting: Physical Therapy

## 2023-03-19 ENCOUNTER — Telehealth: Payer: Self-pay | Admitting: Family Medicine

## 2023-03-19 NOTE — Telephone Encounter (Signed)
[employer] called to request a refill for pt's lovastatin to be sent for 100 day supply as they will cover it if sent that way. After reviewing chart, noted that pt had a refill sent in for a 90 day supply of medication on 10.3.24. Rep stated that they would like to look into redoing to script to reflect a 100 day supply.

## 2023-03-31 ENCOUNTER — Encounter: Payer: No Typology Code available for payment source | Admitting: Physical Therapy

## 2023-04-07 ENCOUNTER — Encounter: Payer: No Typology Code available for payment source | Admitting: Physical Therapy

## 2023-04-12 ENCOUNTER — Encounter (HOSPITAL_BASED_OUTPATIENT_CLINIC_OR_DEPARTMENT_OTHER): Payer: Self-pay

## 2023-04-12 ENCOUNTER — Ambulatory Visit (HOSPITAL_BASED_OUTPATIENT_CLINIC_OR_DEPARTMENT_OTHER)
Admission: RE | Admit: 2023-04-12 | Discharge: 2023-04-12 | Disposition: A | Discharge status: Home / Self Care | Payer: No Typology Code available for payment source | Source: Ambulatory Visit | Attending: Family Medicine | Admitting: Family Medicine

## 2023-04-12 DIAGNOSIS — Z1231 Encounter for screening mammogram for malignant neoplasm of breast: Secondary | ICD-10-CM | POA: Diagnosis not present

## 2023-04-18 ENCOUNTER — Other Ambulatory Visit: Payer: Self-pay | Admitting: Family Medicine

## 2023-04-18 DIAGNOSIS — I10 Essential (primary) hypertension: Secondary | ICD-10-CM

## 2023-04-19 ENCOUNTER — Other Ambulatory Visit: Payer: Self-pay | Admitting: Family Medicine

## 2023-04-19 DIAGNOSIS — I10 Essential (primary) hypertension: Secondary | ICD-10-CM

## 2023-04-19 MED ORDER — HYDROCHLOROTHIAZIDE 12.5 MG PO CAPS: 12.5000 mg | ORAL_CAPSULE | Freq: Every day | ORAL | 0 refills | Status: DC

## 2023-04-19 MED ORDER — METOPROLOL TARTRATE 100 MG PO TABS
100.0000 mg | ORAL_TABLET | Freq: Two times a day (BID) | ORAL | 0 refills | Status: DC
Start: 2023-04-19 — End: 2023-06-07

## 2023-04-19 MED ORDER — LOVASTATIN 40 MG PO TABS: 40.0000 mg | ORAL_TABLET | Freq: Every day | ORAL | 0 refills | Status: DC

## 2023-04-23 ENCOUNTER — Encounter: Payer: Self-pay | Admitting: Surgical

## 2023-04-23 ENCOUNTER — Ambulatory Visit (INDEPENDENT_AMBULATORY_CARE_PROVIDER_SITE_OTHER): Payer: No Typology Code available for payment source | Admitting: Surgical

## 2023-04-23 DIAGNOSIS — Z96612 Presence of left artificial shoulder joint: Secondary | ICD-10-CM

## 2023-04-23 NOTE — Progress Notes (Signed)
Post-Op Visit Note   Patient: [name]           Date of Birth: [birth year]           MRN: [id number] Visit Date: 04/23/2023 PCP: Bradd Canary, MD   Assessment & Plan:  Chief Complaint:  Chief Complaint  Patient presents with   Left Shoulder - Routine Post Op     L EFT RSA, BT (surgery date 01-28-23)   Visit Diagnoses:  1. History of arthroplasty of left shoulder     Plan: Patient is a 72 year old female who is 3 months out from left reverse shoulder arthroplasty.  She is doing very well and really has no complaints.  Not really taking any medication for her shoulder.  She is able to sleep through the night and lay on her left side.  She feels very functional with her shoulder and can lift up to 20 pounds without any issue.  On exam, she has incision that is healed well without evidence of infection or dehiscence.  Axillary nerve intact with deltoid firing.  Good subscap strength.  She has range of motion 45 degrees external rotation, 90 degrees abduction, 160 degrees forward elevation passively and actively.  Left arm is warm and well-perfused with palpable radial pulse.  Plan is follow-up with the office as needed.  [name] has recovered excellently following shoulder placement.  Did discuss dental antibiotic prophylaxis and lifting restriction of 25 to 30 pounds long-term.  She understands and agrees with plan.  Follow-up as needed.  Follow-Up Instructions: Return if symptoms worsen or fail to improve.   Orders:  No orders of the defined types were placed in this encounter.  No orders of the defined types were placed in this encounter.   Imaging: No results found.  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   Transient neurologic deficit 08/18/2017   Obesity 02/08/2017   Palpitations 02/04/2017   Sleep apnea 10/01/2016   Diverticulosis 01/26/2014   Benign paroxysmal positional vertigo 11/14/2013   Urinary frequency 09/02/2013   Post-menopause 09/02/2013   H/O tobacco use, presenting hazards to health 09/02/2013   GERD (gastroesophageal reflux disease) 09/02/2013   History of colonic polyps 09/02/2013   Preventative health care 11/28/2012   RHINITIS 06/27/2010   GLUCOSE INTOLERANCE 12/22/2007   Hyperlipidemia 12/20/2007   Essential hypertension, benign 12/20/2007   ARTHRITIS 12/20/2007   KNEE SPRAIN 12/20/2007   CARCINOMA, BASAL CELL, HX OF 12/20/2007   ARRHYTHMIA, HX OF 12/20/2007   GLAUCOMA, HX OF 12/20/2007   Past Medical History:  Diagnosis Date   Anemia    Arthritis    Benign paroxysmal positional vertigo 11/14/2013   Colon polyp    Depression    Diverticulosis    Dysrhythmia    Essential hypertension, benign    GERD (gastroesophageal reflux disease) 09/02/2013   GI bleed    Glaucoma    Glucose intolerance (impaired glucose tolerance)    H/O tobacco use, presenting hazards to health 09/02/2013   History  of basal cell  carcinoma    History of cardiac arrhythmia    Hyperlipidemia    Hypertension    Medicare welcome visit 11/28/2012   Obesity 02/08/2017   Palpitations 02/04/2017   Personal history of colonic polyps 09/02/2013   Patient reports H/O 2 colonoscopies, last one dates back to 2012 in [place] She reports they were labeled as incomplete.  Reports 1st colonoscopy had 1 benign polyp and the 2nd was clear.    Renal insufficiency 04/28/2021   Sleep apnea 10/01/2016   Not on CPAP machine    Stroke [facility])    UTI (urinary tract infection)      Family History  Problem Relation Age of Onset   Coronary artery disease Paternal Grandmother    Hyperlipidemia Paternal Grandmother    Hypertension Paternal Grandmother    Stroke Paternal Grandmother    Hypertension Mother    Lung cancer Mother        smoker   Uterine cancer Mother    Birth defects Mother        lung cancer in smoker   COPD Mother    Hypertension Father    Kidney disease Father        Renal Cell Carcinoma   Birth defects Father        renal cell CA, smoker   Hypertension Maternal Grandmother    Hypertension Maternal Grandfather    Hypertension Paternal Grandfather    Dementia Paternal Grandfather    Cancer Brother        testicular   Heart disease Brother        NI, smoker   Hyperlipidemia Other    Colon cancer Neg Hx    Esophageal cancer Neg Hx     Past Surgical History:  Procedure Laterality Date   BICEPT TENODESIS Left 01/28/2023   Procedure: BICEPS TENODESIS;  Surgeon: Cammy Copa, MD;  Location: [facility];  Service: Orthopedics;  Laterality: Left;   CAROTID STENT     CERVICAL CONE BIOPSY  1960s   COLONOSCOPY  2016   DILATION AND CURETTAGE OF UTERUS  1966   KNEE SURGERY Left 11/01/2008   left knee total replacement Dr Lajoyce Corners   REVERSE SHOULDER ARTHROPLASTY Left 01/28/2023   Procedure: LEFT REVERSE SHOULDER ARTHROPLASTY;  Surgeon: Cammy Copa, MD;  Location: [facility];  Service: Orthopedics;  Laterality: Left;   WISDOM TOOTH EXTRACTION     Social History   Occupational History   Occupation: Retired   Tobacco Use   Smoking status: Former    Current packs/day: 0.00    Average packs/day: 1 pack/day for 15.0 years (15.0 ttl pk-yrs)    Types: Cigarettes    Start date: 06/09/1967    Quit date: 06/08/1982    Years since quitting: 40.9   Smokeless tobacco: Never  Vaping Use   Vaping status: Never Used  Substance and Sexual Activity   Alcohol use: Yes    Comment: 1-2 glasses of wine per day   Drug use: No   Sexual activity: Not Currently     Birth control/protection: Post-menopausal    Comment: lives alone, no dietary restrictions except no lactose,

## 2023-05-12 ENCOUNTER — Ambulatory Visit: Payer: No Typology Code available for payment source | Admitting: *Deleted

## 2023-05-12 DIAGNOSIS — Z0000 Encounter for general adult medical examination without abnormal findings: Secondary | ICD-10-CM | POA: Diagnosis not present

## 2023-05-12 NOTE — Progress Notes (Signed)
Subjective:   [name] is a 72 y.o. female who presents for Medicare Annual (Subsequent) preventive examination.  Visit Complete: Virtual I connected with  [name] on 05/12/23 by a audio enabled telemedicine application and verified that I am speaking with the correct person using two identifiers.  Patient Location: Home  Provider Location: Office/Clinic  I discussed the limitations of evaluation and management by telemedicine. The patient expressed understanding and agreed to proceed.  Vital Signs: Because this visit was a virtual/telehealth visit, some criteria may be missing or patient reported. Any vitals not documented were not able to be obtained and vitals that have been documented are patient reported.  Cardiac Risk Factors include: advanced age (>65men, >51 women);dyslipidemia;hypertension     Objective:    Today's Vitals   05/12/23 1101  PainSc: 5    There is no height or weight on file to calculate BMI.     05/12/2023   11:03 AM 02/24/2023   11:06 AM 01/28/2023    4:45 PM 01/28/2023    9:18 AM 01/13/2023   10:28 AM 04/28/2021   10:24 AM 10/06/2019    9:57 AM  Advanced Directives  Does Patient Have a Medical Advance Directive? Yes Yes Yes Yes Yes Yes Yes  Type of Estate agent of [place];Living will Healthcare Power of [place];Living will Living will;Healthcare Power of [employer] Power of [place];Living will Healthcare Power of [place];Living will Healthcare Power of [place];Living will   Does patient want to make changes to medical advance directive? No - Patient declined No - Patient declined No - Patient declined No - Patient declined No - Patient declined  No - Patient declined  Copy of Healthcare Power of Attorney in Chart? Yes - validated most recent copy scanned in chart (See row information)  No - copy requested Yes - validated most recent copy scanned in chart (See row information) Yes - validated most recent copy  scanned in chart (See row information) Yes - validated most recent copy scanned in chart (See row information)     Current Medications (verified) Outpatient Encounter Medications as of 05/12/2023  Medication Sig   acetaminophen (TYLENOL) 650 MG CR tablet Take 650-1,300 mg by mouth every 8 (eight) hours as needed for pain.   aspirin EC 81 MG tablet Take 81 mg by mouth at bedtime.   Cyanocobalamin (VITAMIN B-12 PO) Take 1 tablet by mouth in the morning.   hydrochlorothiazide (MICROZIDE) 12.5 MG capsule Take 1 capsule (12.5 mg total) by mouth daily.   lovastatin (MEVACOR) 40 MG tablet Take 1 tablet (40 mg total) by mouth at bedtime.   methocarbamol (ROBAXIN) 500 MG tablet Take 1 tablet (500 mg total) by mouth every 8 (eight) hours as needed for muscle spasms. (Patient not taking: Reported on 02/24/2023)   metoprolol tartrate (LOPRESSOR) 100 MG tablet Take 1 tablet (100 mg total) by mouth 2 (two) times daily.   Multiple Vitamins-Minerals (MULTIVITAMIN WOMEN PO) Take 1 tablet by mouth in the morning.   Oxymetazoline HCl (NASAL SPRAY) 0.05 % SOLN Place 1 spray into the nose 2 (two) times daily as needed (congestion.).   traMADol (ULTRAM) 50 MG tablet Take 1 tablet (50 mg total) by mouth every 6 (six) hours as needed. (Patient not taking: Reported on 02/24/2023)   tretinoin (RETIN-A) 0.1 % cream APPLY TO AFFECTED AREA(S) OF FACE ONE TIME DAILY AT BEDTIME USING A PEA SIZED AMOUNT (Patient taking differently: Apply 1 Application topically in the morning.)   [DISCONTINUED]  docusate sodium (COLACE) 100 MG capsule Take 1 capsule (100 mg total) by mouth 2 (two) times daily.   [DISCONTINUED] gabapentin (NEURONTIN) 300 MG capsule Take 1 capsule (300 mg total) by mouth 3 (three) times daily. (Patient not taking: Reported on 02/24/2023)   [DISCONTINUED] HYDROcodone-acetaminophen (NORCO) 5-325 MG tablet Take 1 tablet by mouth daily as needed for moderate pain. (Patient not taking: Reported on 02/24/2023)    [DISCONTINUED] lidocaine (LIDODERM) 5 % Place 1 patch onto the skin daily. Avoid incision. Remove & Discard patch within 12 hours or as directed by MD (Patient not taking: Reported on 02/24/2023)   No facility-administered encounter medications on file as of 05/12/2023.    Allergies (verified) Patient has no known allergies.   History: Past Medical History:  Diagnosis Date   Anemia    Arthritis    Benign paroxysmal positional vertigo 11/14/2013   Colon polyp    Depression    Diverticulosis    Dysrhythmia    Essential hypertension, benign    GERD (gastroesophageal reflux disease) 09/02/2013   GI bleed    Glaucoma    Glucose intolerance (impaired glucose tolerance)    H/O tobacco use, presenting hazards to health 09/02/2013   History  of basal cell carcinoma    History of cardiac arrhythmia    Hyperlipidemia    Hypertension    Medicare welcome visit 11/28/2012   Obesity 02/08/2017   Palpitations 02/04/2017   Personal history of colonic polyps 09/02/2013   Patient reports H/O 2 colonoscopies, last one dates back to 2012 in [place] She reports they were labeled as incomplete.  Reports 1st colonoscopy had 1 benign polyp and the 2nd was clear.    Renal insufficiency 04/28/2021   Sleep apnea 10/01/2016   Not on CPAP machine    Stroke [facility])    UTI (urinary tract infection)    Past Surgical History:  Procedure Laterality Date   BICEPT TENODESIS Left 01/28/2023   Procedure: BICEPS TENODESIS;  Surgeon: Cammy Copa, MD;  Location: [facility];  Service: Orthopedics;  Laterality: Left;   CAROTID STENT     CERVICAL CONE BIOPSY  1960s   COLONOSCOPY  2016   DILATION AND CURETTAGE OF UTERUS  1966   JOINT REPLACEMENT  2010 & 2024   L knee and L shoulder   KNEE SURGERY Left 11/01/2008   left knee total replacement Dr Lajoyce Corners   REVERSE SHOULDER ARTHROPLASTY Left 01/28/2023   Procedure: LEFT REVERSE SHOULDER ARTHROPLASTY;  Surgeon: Cammy Copa, MD;  Location: [facility];  Service:  Orthopedics;  Laterality: Left;   TUBAL LIGATION  1972   WISDOM TOOTH EXTRACTION     Family History  Problem Relation Age of Onset   Coronary artery disease Paternal Grandmother    Hyperlipidemia Paternal Grandmother    Hypertension Paternal Grandmother    Stroke Paternal Grandmother    Hypertension Mother    Lung cancer Mother        smoker   Uterine cancer Mother    Birth defects Mother        lung cancer in smoker   COPD Mother    Cancer Mother    Early death Mother    Obesity Mother    Hypertension Father    Kidney disease Father        Renal Cell Carcinoma   Birth defects Father        renal cell CA, smoker   Cancer Father    Hypertension Maternal Grandmother    Hypertension  Maternal Grandfather    Hypertension Paternal Grandfather    Dementia Paternal Grandfather    Hearing loss Paternal Grandfather    Cancer Brother        testicular   Heart disease Brother        NI, smoker   Early death Brother    Hyperlipidemia Other    Cancer Paternal Aunt    COPD Paternal Aunt    Colon cancer Neg Hx    Esophageal cancer Neg Hx    Social History   Socioeconomic History   Marital status: Divorced    Spouse name: Not on file   Number of children: 0   Years of education: Not on file   Highest education level: Not on file  Occupational History   Occupation: Retired   Tobacco Use   Smoking status: Former    Current packs/day: 0.00    Average packs/day: 1 pack/day for 15.0 years (15.0 ttl pk-yrs)    Types: Cigarettes    Start date: 06/09/1967    Quit date: 06/08/1982    Years since quitting: 40.9   Smokeless tobacco: Never  Vaping Use   Vaping status: Never Used  Substance and Sexual Activity   Alcohol use: Yes    Alcohol/week: 4.0 standard drinks of alcohol    Types: 2 Glasses of wine, 2 Standard drinks or equivalent per week    Comment: 1-2 glasses of wine per day   Drug use: Never   Sexual activity: Not Currently    Birth control/protection: Post-menopausal     Comment: lives alone, no dietary restrictions except no lactose,  Other Topics Concern   Not on file  Social History Narrative   Occupation: unemployed Education administrator- now retired   Divorced   no children    Moved from [place]    Lives alone with 4 cats   Enjoys gardening, planting.             Social Determinants of Health   Financial Resource Strain: Low Risk  (05/12/2023)   Overall Financial Resource Strain (CARDIA)    Difficulty of Paying Living Expenses: Not very hard  Food Insecurity: No Food Insecurity (01/28/2023)   Hunger Vital Sign    Worried About Running Out of Food in the Last Year: Never true    Ran Out of Food in the Last Year: Never true  Transportation Needs: No Transportation Needs (01/28/2023)   PRAPARE - Administrator, Civil Service (Medical): No    Lack of Transportation (Non-Medical): No  Physical Activity: Inactive (05/12/2023)   Exercise Vital Sign    Days of Exercise per Week: 0 days    Minutes of Exercise per Session: 0 min  Stress: No Stress Concern Present (05/12/2023)   [employer] of Occupational Health - Occupational Stress Questionnaire    Feeling of Stress : Only a little  Social Connections: Socially Isolated (05/12/2023)   Social Connection and Isolation Panel [NHANES]    Frequency of Communication with Friends and Family: More than three times a week    Frequency of Social Gatherings with Friends and Family: Once a week    Attends Religious Services: Never    Database administrator or Organizations: No    Attends Engineer, structural: Never    Marital Status: Divorced    Tobacco Counseling Counseling given: Not Answered   Clinical Intake:  Pre-visit preparation completed: Yes  Pain : 0-10 Pain Score: 5  Pain Type: Acute pain Pain Location: Head  Pain Descriptors / Indicators: Aching Pain Onset: 1 to 4 weeks ago Pain Frequency: Intermittent  Nutritional Risks: None Diabetes: No  How often do  you need to have someone help you when you read instructions, pamphlets, or other written materials from your doctor or pharmacy?: 1 - Never  Interpreter Needed?: No  Information entered by :: Donne Anon, CMA   Activities of Daily Living    05/12/2023   11:06 AM 01/28/2023    4:45 PM  In your present state of health, do you have any difficulty performing the following activities:  Hearing? 0 0  Vision? 1 0  Difficulty concentrating or making decisions? 0 0  Walking or climbing stairs? 0 0  Dressing or bathing? 0 0  Doing errands, shopping? 0 0  Preparing Food and eating ? N   Using the Toilet? N   In the past six months, have you accidently leaked urine? Y   Do you have problems with loss of bowel control? N   Managing your Medications? N   Managing your Finances? N   Housekeeping or managing your Housekeeping? N     Patient Care Team: Bradd Canary, MD as PCP - General (Family Medicine) Venancio Poisson, MD as Consulting Physician (Dermatology) Allie Bossier, MD as Consulting Physician (Obstetrics and Gynecology) Sydnee Cabal., MD as Consulting Physician (Gastroenterology)  Indicate any recent Medical Services you may have received from other than [facility] providers in the past year (date may be approximate).     Assessment:   This is a routine wellness examination for [facility].  Hearing/Vision screen No results found.   Goals Addressed   None    Depression Screen    05/12/2023   11:13 AM 11/30/2022   11:28 AM 05/07/2022    4:16 PM 04/28/2021   10:28 AM 09/19/2020    2:22 PM 02/23/2019   10:12 AM 02/21/2018   11:20 AM  PHQ 2/9 Scores  PHQ - 2 Score 1 0 0 0 0 0 0  PHQ- 9 Score  0 0  3      Fall Risk    05/12/2023   11:09 AM 11/30/2022   11:28 AM 05/07/2022    4:15 PM 04/28/2021   10:26 AM 09/19/2020    2:22 PM  Fall Risk   Falls in the past year? 0 0 0 0 1  Number falls in past yr: 0 0 0 0 1  Injury with Fall? 0 0 0 0 0  Risk for fall due to : No Fall Risks   No Fall Risks    Follow up Falls evaluation completed Falls evaluation completed Falls evaluation completed Falls prevention discussed     MEDICARE RISK AT HOME: Medicare Risk at Home Any stairs in or around the home?: Yes (steps leading up to porch) If so, are there any without handrails?: No Home free of loose throw rugs in walkways, pet beds, electrical cords, etc?: No Adequate lighting in your home to reduce risk of falls?: Yes Life alert?: No Use of a cane, walker or w/c?: No Grab bars in the bathroom?: Yes Shower chair or bench in shower?: Yes (built in seat) Elevated toilet seat or a handicapped toilet?: No  TIMED UP AND GO:  Was the test performed?  No    Cognitive Function:    02/04/2016   10:48 AM  MMSE - Mini Mental State Exam  Orientation to time 5  Orientation to Place 5  Registration 3  Attention/ Calculation 5  Recall 3  Language- name 2 objects 2  Language- repeat 1  Language- follow 3 step command 3  Language- read & follow direction 1  Write a sentence 1  Copy design 1  Total score 30        05/12/2023   11:19 AM  6CIT Screen  What Year? 0 points  What month? 0 points  What time? 0 points  Count back from 20 0 points  Months in reverse 0 points  Repeat phrase 0 points  Total Score 0 points    Immunizations Immunization History  Administered Date(s) Administered   Fluad Quad(high Dose 65+) 03/12/2023   Influenza, High Dose Seasonal PF 03/08/2022   Influenza, Quadrivalent, Recombinant, Inj, Pf 02/27/2019   Influenza-Unspecified 02/27/2019, 03/02/2020, 03/19/2021   Moderna Covid-19 Vaccine Bivalent Booster 75yrs & up 04/20/2023   PFIZER(Purple Top)SARS-COV-2 Vaccination 08/01/2019, 08/22/2019, 03/02/2020, 02/06/2022, 03/06/2022   PNEUMOCOCCAL CONJUGATE-20 12/25/2020   Pfizer Covid-19 Vaccine Bivalent Booster 61yrs & up 04/30/2021   Pneumococcal Conjugate-13 08/28/2013   Pneumococcal Polysaccharide-23 10/16/2008, 12/25/2014    RSV,unspecified 06/11/2022   Tdap 11/28/2012, 08/12/2022   Zoster Recombinant(Shingrix) 09/24/2021, 11/19/2021   Zoster, Live 12/02/2012    TDAP status: Up to date  Flu Vaccine status: Up to date  Pneumococcal vaccine status: Up to date  Covid-19 vaccine status: Completed vaccines  Qualifies for Shingles Vaccine? Yes   Zostavax completed Yes   Shingrix Completed?: Yes  Screening Tests Health Maintenance  Topic Date Due   Medicare Annual Wellness (AWV)  05/08/2023   COVID-19 Vaccine (7 - 2023-24 season) 06/15/2023   Colonoscopy  12/26/2024   MAMMOGRAM  04/11/2025   DTaP/Tdap/Td (3 - Td or Tdap) 08/11/2032   Pneumonia Vaccine 84+ Years old  Completed   INFLUENZA VACCINE  Completed   DEXA SCAN  Completed   Hepatitis C Screening  Completed   Zoster Vaccines- Shingrix  Completed   HPV VACCINES  Aged Out    Health Maintenance  Health Maintenance Due  Topic Date Due   Medicare Annual Wellness (AWV)  05/08/2023    Colorectal cancer screening: Type of screening: Colonoscopy. Completed 12/27/19. Repeat every 5 years  Mammogram status: Completed 04/12/23. Repeat every year  Bone Density status: Completed 05/19/21. Results reflect: Bone density results: NORMAL. Repeat every 2 years.  Lung Cancer Screening: (Low Dose CT Chest recommended if Age 27-80 years, 20 pack-year currently smoking OR have quit w/in 15years.) does not qualify.   Additional Screening:  Hepatitis C Screening: does qualify; Completed 02/04/16  Vision Screening: Recommended annual ophthalmology exams for early detection of glaucoma and other disorders of the eye. Is the patient up to date with their annual eye exam?  Yes  Who is the provider or what is the name of the office in which the patient attends annual eye exams? [facility] If pt is not established with a provider, would they like to be referred to a provider to establish care? No .   Dental Screening: Recommended annual dental exams  for proper oral hygiene  Diabetic Foot Exam: N/a  Community Resource Referral / Chronic Care Management: CRR required this visit?  No   CCM required this visit?  No     Plan:     I have personally reviewed and noted the following in the patient's chart:   Medical and social history Use of alcohol, tobacco or illicit drugs  Current medications and supplements including opioid prescriptions. Patient is not currently taking opioid prescriptions. Functional ability and status  Nutritional status Physical activity Advanced directives List of other physicians Hospitalizations, surgeries, and ER visits in previous 12 months Vitals Screenings to include cognitive, depression, and falls Referrals and appointments  In addition, I have reviewed and discussed with patient certain preventive protocols, quality metrics, and best practice recommendations. A written personalized care plan for preventive services as well as general preventive health recommendations were provided to patient.     Donne Anon, CMA   05/12/2023   After Visit Summary: (MyChart) Due to this being a telephonic visit, the after visit summary with patients personalized plan was offered to patient via MyChart   Nurse Notes: None

## 2023-05-12 NOTE — Patient Instructions (Signed)
[name] , Thank you for taking time to come for your Medicare Wellness Visit. I appreciate your ongoing commitment to your health goals. Please review the following plan we discussed and let me know if I can assist you in the future.     This is a list of the screening recommended for you and due dates:  Health Maintenance  Topic Date Due   COVID-19 Vaccine (7 - 2023-24 season) 06/15/2023   Medicare Annual Wellness Visit  05/11/2024   Colon Cancer Screening  12/26/2024   Mammogram  04/11/2025   DTaP/Tdap/Td vaccine (3 - Td or Tdap) 08/11/2032   Pneumonia Vaccine  Completed   Flu Shot  Completed   DEXA scan (bone density measurement)  Completed   Hepatitis C Screening  Completed   Zoster (Shingles) Vaccine  Completed   HPV Vaccine  Aged Out     Next appointment: Follow up in one year for your annual wellness visit.   Preventive Care 63 Years and Older, Female Preventive care refers to lifestyle choices and visits with your health care provider that can promote health and wellness. What does preventive care include? A yearly physical exam. This is also called an annual well check. Dental exams once or twice a year. Routine eye exams. Ask your health care provider how often you should have your eyes checked. Personal lifestyle choices, including: Daily care of your teeth and gums. Regular physical activity. Eating a healthy diet. Avoiding tobacco and drug use. Limiting alcohol use. Practicing safe sex. Taking low-dose aspirin every day. Taking vitamin and mineral supplements as recommended by your health care provider. What happens during an annual well check? The services and screenings done by your health care provider during your annual well check will depend on your age, overall health, lifestyle risk factors, and family history of disease. Counseling  Your health care provider may ask you questions about your: Alcohol use. Tobacco use. Drug use. Emotional  well-being. Home and relationship well-being. Sexual activity. Eating habits. History of falls. Memory and ability to understand (cognition). Work and work Astronomer. Reproductive health. Screening  You may have the following tests or measurements: Height, weight, and BMI. Blood pressure. Lipid and cholesterol levels. These may be checked every 5 years, or more frequently if you are over 33 years old. Skin check. Lung cancer screening. You may have this screening every year starting at age 44 if you have a 30-pack-year history of smoking and currently smoke or have quit within the past 15 years. Fecal occult blood test (FOBT) of the stool. You may have this test every year starting at age 49. Flexible sigmoidoscopy or colonoscopy. You may have a sigmoidoscopy every 5 years or a colonoscopy every 10 years starting at age 13. Hepatitis C blood test. Hepatitis B blood test. Sexually transmitted disease (STD) testing. Diabetes screening. This is done by checking your blood sugar (glucose) after you have not eaten for a while (fasting). You may have this done every 1-3 years. Bone density scan. This is done to screen for osteoporosis. You may have this done starting at age 57. Mammogram. This may be done every 1-2 years. Talk to your health care provider about how often you should have regular mammograms. Talk with your health care provider about your test results, treatment options, and if necessary, the need for more tests. Vaccines  Your health care provider may recommend certain vaccines, such as: Influenza vaccine. This is recommended every year. Tetanus, diphtheria, and acellular pertussis (Tdap, Td)  vaccine. You may need a Td booster every 10 years. Zoster vaccine. You may need this after age 1. Pneumococcal 13-valent conjugate (PCV13) vaccine. One dose is recommended after age 73. Pneumococcal polysaccharide (PPSV23) vaccine. One dose is recommended after age 15. Talk to your  health care provider about which screenings and vaccines you need and how often you need them. This information is not intended to replace advice given to you by your health care provider. Make sure you discuss any questions you have with your health care provider. Document Released: 06/21/2015 Document Revised: 02/12/2016 Document Reviewed: 03/26/2015 Elsevier Interactive Patient Education  2017 [employer].  Fall Prevention in the Home Falls can cause injuries. They can happen to people of all ages. There are many things you can do to make your home safe and to help prevent falls. What can I do on the outside of my home? Regularly fix the edges of walkways and driveways and fix any cracks. Remove anything that might make you trip as you walk through a door, such as a raised step or threshold. Trim any bushes or trees on the path to your home. Use bright outdoor lighting. Clear any walking paths of anything that might make someone trip, such as rocks or tools. Regularly check to see if handrails are loose or broken. Make sure that both sides of any steps have handrails. Any raised decks and porches should have guardrails on the edges. Have any leaves, snow, or ice cleared regularly. Use sand or salt on walking paths during winter. Clean up any spills in your garage right away. This includes oil or grease spills. What can I do in the bathroom? Use night lights. Install grab bars by the toilet and in the tub and shower. Do not use towel bars as grab bars. Use non-skid mats or decals in the tub or shower. If you need to sit down in the shower, use a plastic, non-slip stool. Keep the floor dry. Clean up any water that spills on the floor as soon as it happens. Remove soap buildup in the tub or shower regularly. Attach bath mats securely with double-sided non-slip rug tape. Do not have throw rugs and other things on the floor that can make you trip. What can I do in the bedroom? Use night  lights. Make sure that you have a light by your bed that is easy to reach. Do not use any sheets or blankets that are too big for your bed. They should not hang down onto the floor. Have a firm chair that has side arms. You can use this for support while you get dressed. Do not have throw rugs and other things on the floor that can make you trip. What can I do in the kitchen? Clean up any spills right away. Avoid walking on wet floors. Keep items that you use a lot in easy-to-reach places. If you need to reach something above you, use a strong step stool that has a grab bar. Keep electrical cords out of the way. Do not use floor polish or wax that makes floors slippery. If you must use wax, use non-skid floor wax. Do not have throw rugs and other things on the floor that can make you trip. What can I do with my stairs? Do not leave any items on the stairs. Make sure that there are handrails on both sides of the stairs and use them. Fix handrails that are broken or loose. Make sure that handrails are as long  as the stairways. Check any carpeting to make sure that it is firmly attached to the stairs. Fix any carpet that is loose or worn. Avoid having throw rugs at the top or bottom of the stairs. If you do have throw rugs, attach them to the floor with carpet tape. Make sure that you have a light switch at the top of the stairs and the bottom of the stairs. If you do not have them, ask someone to add them for you. What else can I do to help prevent falls? Wear shoes that: Do not have high heels. Have rubber bottoms. Are comfortable and fit you well. Are closed at the toe. Do not wear sandals. If you use a stepladder: Make sure that it is fully opened. Do not climb a closed stepladder. Make sure that both sides of the stepladder are locked into place. Ask someone to hold it for you, if possible. Clearly mark and make sure that you can see: Any grab bars or handrails. First and last  steps. Where the edge of each step is. Use tools that help you move around (mobility aids) if they are needed. These include: Canes. Walkers. Scooters. Crutches. Turn on the lights when you go into a dark area. Replace any light bulbs as soon as they burn out. Set up your furniture so you have a clear path. Avoid moving your furniture around. If any of your floors are uneven, fix them. If there are any pets around you, be aware of where they are. Review your medicines with your doctor. Some medicines can make you feel dizzy. This can increase your chance of falling. Ask your doctor what other things that you can do to help prevent falls. This information is not intended to replace advice given to you by your health care provider. Make sure you discuss any questions you have with your health care provider. Document Released: 03/21/2009 Document Revised: 10/31/2015 Document Reviewed: 06/29/2014 Elsevier Interactive Patient Education  2017 [employer].

## 2023-05-17 ENCOUNTER — Encounter: Payer: Self-pay | Admitting: Family Medicine

## 2023-05-17 ENCOUNTER — Ambulatory Visit (INDEPENDENT_AMBULATORY_CARE_PROVIDER_SITE_OTHER): Payer: No Typology Code available for payment source | Admitting: Family Medicine

## 2023-05-17 VITALS — BP 128/82 | HR 78 | Temp 98.0°F | Resp 18 | Ht 61.0 in | Wt 189.2 lb

## 2023-05-17 DIAGNOSIS — G4452 New daily persistent headache (NDPH): Secondary | ICD-10-CM | POA: Insufficient documentation

## 2023-05-17 DIAGNOSIS — Z9889 Other specified postprocedural states: Secondary | ICD-10-CM | POA: Diagnosis not present

## 2023-05-17 LAB — CBC WITH DIFFERENTIAL/PLATELET
Basophils Absolute: 0 10*3/uL (ref 0.0–0.1)
Basophils Relative: 0.4 % (ref 0.0–3.0)
Eosinophils Absolute: 0.1 10*3/uL (ref 0.0–0.7)
Eosinophils Relative: 1.8 % (ref 0.0–5.0)
HCT: 35 % — ABNORMAL LOW (ref 36.0–46.0)
Hemoglobin: 11.5 g/dL — ABNORMAL LOW (ref 12.0–15.0)
Lymphocytes Relative: 31.3 % (ref 12.0–46.0)
Lymphs Abs: 2.1 10*3/uL (ref 0.7–4.0)
MCHC: 33 g/dL (ref 30.0–36.0)
MCV: 91.3 fL (ref 78.0–100.0)
Monocytes Absolute: 0.5 10*3/uL (ref 0.1–1.0)
Monocytes Relative: 8.1 % (ref 3.0–12.0)
Neutro Abs: 3.9 10*3/uL (ref 1.4–7.7)
Neutrophils Relative %: 58.4 % (ref 43.0–77.0)
Platelets: 285 10*3/uL (ref 150.0–400.0)
RBC: 3.83 Mil/uL — ABNORMAL LOW (ref 3.87–5.11)
RDW: 14.6 % (ref 11.5–15.5)
WBC: 6.7 10*3/uL (ref 4.0–10.5)

## 2023-05-17 LAB — COMPREHENSIVE METABOLIC PANEL
ALT: 7 U/L (ref 0–35)
AST: 14 U/L (ref 0–37)
Albumin: 4.1 g/dL (ref 3.5–5.2)
Alkaline Phosphatase: 66 U/L (ref 39–117)
BUN: 27 mg/dL — ABNORMAL HIGH (ref 6–23)
CO2: 27 meq/L (ref 19–32)
Calcium: 9.4 mg/dL (ref 8.4–10.5)
Chloride: 101 meq/L (ref 96–112)
Creatinine, Ser: 1.19 mg/dL (ref 0.40–1.20)
GFR: 44.92 mL/min — ABNORMAL LOW (ref >60.00)
Glucose, Bld: 115 mg/dL — ABNORMAL HIGH (ref 70–99)
Potassium: 4.1 meq/L (ref 3.5–5.1)
Sodium: 137 meq/L (ref 135–145)
Total Bilirubin: 0.3 mg/dL (ref 0.2–1.2)
Total Protein: 7.4 g/dL (ref 6.0–8.3)

## 2023-05-17 LAB — SEDIMENTATION RATE: Sed Rate: 40 mm/h — ABNORMAL HIGH (ref 0–30)

## 2023-05-17 LAB — TSH: TSH: 2.4 u[IU]/mL (ref 0.35–5.50)

## 2023-05-17 MED ORDER — PREDNISONE 20 MG PO TABS
ORAL_TABLET | ORAL | 0 refills | Status: AC
Start: 2023-05-17 — End: ?

## 2023-05-17 NOTE — Progress Notes (Signed)
Established Patient Office Visit  Subjective   Patient ID: [name], female    DOB: April 04, 1951  Age: 72 y.o. MRN: [id number]  Chief Complaint  Patient presents with   Headache    Pt states headache going on for months. Pt states having some tenderness on the left side of her forehead. Pt reports taking Tylenol daily.     HPI Discussed the use of AI scribe software for clinical note transcription with the patient, who gave verbal consent to proceed.  History of Present Illness   The patient, with a history of kidney disease, temporal arteritis, and recent shoulder replacement, presents with a new onset of a tender, raised vein in her forehead. This was noticed approximately a month ago and has been persistently painful. 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 The pain extends to the back and top of her scalp. The patient manages the pain with Tylenol due to her kidney disease, which provides some relief.  In addition to the pain, the patient reports lethargy. 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 The patient also underwent a shoulder replacement in the same month, which was delayed due to elevated sedimentation rate. Post-operatively, the patient experienced significant pain, which was managed with Tylenol due to intolerance to opioids.  The patient also reports frequent watering of her eyes. She has been experiencing significant physical discomfort, which has affected her demeanor. The patient is considering switching primary care providers due to scheduling difficulties with her current provider. She is also concerned about upcoming changes to her benefits, which will increase her copay for procedures such as CT or MRI.      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   Transient neurologic deficit 08/18/2017   Obesity 02/08/2017   Palpitations 02/04/2017   Sleep apnea 10/01/2016   Diverticulosis 01/26/2014   Benign paroxysmal positional vertigo 11/14/2013   Urinary frequency 09/02/2013   Post-menopause 09/02/2013   H/O tobacco use, presenting hazards to health 09/02/2013   GERD (gastroesophageal reflux disease) 09/02/2013   History of colonic polyps 09/02/2013   Preventative health care 11/28/2012   RHINITIS 06/27/2010   GLUCOSE INTOLERANCE 12/22/2007   Hyperlipidemia 12/20/2007   Essential hypertension, benign 12/20/2007   ARTHRITIS 12/20/2007   KNEE SPRAIN 12/20/2007   CARCINOMA, BASAL CELL, HX OF 12/20/2007   ARRHYTHMIA, HX OF 12/20/2007   GLAUCOMA, HX OF 12/20/2007   Past Medical History:  Diagnosis Date   Anemia    Arthritis    Benign paroxysmal positional vertigo 11/14/2013   Colon polyp    Depression    Diverticulosis    Dysrhythmia    Essential hypertension, benign    GERD (gastroesophageal reflux disease) 09/02/2013   GI bleed    Glaucoma    Glucose intolerance (impaired glucose tolerance)  H/O tobacco use, presenting hazards to health 09/02/2013   History  of basal cell carcinoma    History of cardiac arrhythmia    Hyperlipidemia    Hypertension    Medicare welcome visit 11/28/2012   Obesity 02/08/2017   Palpitations 02/04/2017   Personal history of colonic polyps 09/02/2013   Patient reports H/O 2 colonoscopies,  last one dates back to 2012 in [place] She reports they were labeled as incomplete.  Reports 1st colonoscopy had 1 benign polyp and the 2nd was clear.    Renal insufficiency 04/28/2021   Sleep apnea 10/01/2016   Not on CPAP machine    Stroke [facility])    UTI (urinary tract infection)    Past Surgical History:  Procedure Laterality Date   BICEPT TENODESIS Left 01/28/2023   Procedure: BICEPS TENODESIS;  Surgeon: Cammy Copa, MD;  Location: [facility];  Service: Orthopedics;  Laterality: Left;   CAROTID STENT     CERVICAL CONE BIOPSY  1960s   COLONOSCOPY  2016   DILATION AND CURETTAGE OF UTERUS  1966   JOINT REPLACEMENT  2010 & 2024   L knee and L shoulder   KNEE SURGERY Left 11/01/2008   left knee total replacement Dr Lajoyce Corners   REVERSE SHOULDER ARTHROPLASTY Left 01/28/2023   Procedure: LEFT REVERSE SHOULDER ARTHROPLASTY;  Surgeon: Cammy Copa, MD;  Location: [facility];  Service: Orthopedics;  Laterality: Left;   TUBAL LIGATION  1972   WISDOM TOOTH EXTRACTION     Social History   Tobacco Use   Smoking status: Former    Current packs/day: 0.00    Average packs/day: 1 pack/day for 15.0 years (15.0 ttl pk-yrs)    Types: Cigarettes    Start date: 06/09/1967    Quit date: 06/08/1982    Years since quitting: 40.9   Smokeless tobacco: Never  Vaping Use   Vaping status: Never Used  Substance Use Topics   Alcohol use: Yes    Alcohol/week: 4.0 standard drinks of alcohol    Types: 2 Glasses of wine, 2 Standard drinks or equivalent per week    Comment: 1-2 glasses of wine per day   Drug use: Never   Social History   Socioeconomic History   Marital status: Divorced    Spouse name: Not on file   Number of children: 0   Years of education: Not on file   Highest education level: 12th grade  Occupational History   Occupation: Retired   Tobacco Use   Smoking status: Former    Current packs/day: 0.00    Average packs/day: 1 pack/day for 15.0 years (15.0 ttl pk-yrs)    Types: Cigarettes     Start date: 06/09/1967    Quit date: 06/08/1982    Years since quitting: 40.9   Smokeless tobacco: Never  Vaping Use   Vaping status: Never Used  Substance and Sexual Activity   Alcohol use: Yes    Alcohol/week: 4.0 standard drinks of alcohol    Types: 2 Glasses of wine, 2 Standard drinks or equivalent per week    Comment: 1-2 glasses of wine per day   Drug use: Never   Sexual activity: Not Currently    Birth control/protection: Post-menopausal    Comment: lives alone, no dietary restrictions except no lactose,  Other Topics Concern   Not on file  Social History Narrative   Occupation: unemployed Education administrator- now retired   Divorced   no children    Moved from [place]    Lives  alone with 4 cats   Enjoys gardening, planting.             Social Determinants of Health   Financial Resource Strain: Low Risk  (05/12/2023)   Overall Financial Resource Strain (CARDIA)    Difficulty of Paying Living Expenses: Not hard at all  Food Insecurity: No Food Insecurity (05/12/2023)   Hunger Vital Sign    Worried About Running Out of Food in the Last Year: Never true    Ran Out of Food in the Last Year: Never true  Transportation Needs: No Transportation Needs (05/12/2023)   PRAPARE - Administrator, Civil Service (Medical): No    Lack of Transportation (Non-Medical): No  Physical Activity: Inactive (05/12/2023)   Exercise Vital Sign    Days of Exercise per Week: 0 days    Minutes of Exercise per Session: 0 min  Stress: No Stress Concern Present (05/12/2023)   [employer] of Occupational Health - Occupational Stress Questionnaire    Feeling of Stress : Only a little  Social Connections: Socially Isolated (05/12/2023)   Social Connection and Isolation Panel [NHANES]    Frequency of Communication with Friends and Family: Three times a week    Frequency of Social Gatherings with Friends and Family: Twice a week    Attends Religious Services: Never    Loss adjuster, chartered or Organizations: No    Attends Banker Meetings: Never    Marital Status: Divorced  Catering manager Violence: Not At Risk (01/28/2023)   Humiliation, Afraid, Rape, and Kick questionnaire    Fear of Current or Ex-Partner: No    Emotionally Abused: No    Physically Abused: No    Sexually Abused: No   Family Status  Relation Name Status   PGM ROSE Mcgrory Deceased   Mother CORRINE Densmore Deceased at age 13   Father LEONARD Dudash - RENAL CANCER Deceased at age 56   MGM Rose Deceased   MGF Jomarie Longs Deceased   PGF Doralee Albino Deceased   Brother GARY Kamphaus Deceased at age 8   Other  (Not Specified)   Vickki Muff (Not Specified)   Pat Aunt Hall Busing (Not Specified)   Neg Hx  (Not Specified)  No partnership data on file   Family History  Problem Relation Age of Onset   Coronary artery disease Paternal Grandmother    Hyperlipidemia Paternal Grandmother    Hypertension Paternal Grandmother    Stroke Paternal Grandmother    Hypertension Mother    Lung cancer Mother        smoker   Uterine cancer Mother    Birth defects Mother        lung cancer in smoker   COPD Mother    Cancer Mother    Early death Mother    Obesity Mother    Hypertension Father    Kidney disease Father        Renal Cell Carcinoma   Birth defects Father        renal cell CA, smoker   Cancer Father    Hypertension Maternal Grandmother    Hypertension Maternal Grandfather    Hypertension Paternal Grandfather    Dementia Paternal Grandfather    Hearing loss Paternal Grandfather    Cancer Brother        testicular   Heart disease Brother        NI, smoker   Early death Brother    Hyperlipidemia Other    Cancer  Paternal Aunt    COPD Paternal Aunt    Colon cancer Neg Hx    Esophageal cancer Neg Hx    No Known Allergies    Review of Systems  Constitutional:  Positive for malaise/fatigue. Negative for chills and fever.  HENT:  Negative for congestion and hearing loss.    Eyes:  Negative for blurred vision and discharge.  Respiratory:  Negative for cough, sputum production and shortness of breath.   Cardiovascular:  Negative for chest pain, palpitations and leg swelling.  Gastrointestinal:  Negative for abdominal pain, blood in stool, constipation, diarrhea, heartburn, nausea and vomiting.  Genitourinary:  Negative for dysuria, frequency, hematuria and urgency.  Musculoskeletal:  Negative for back pain, falls and myalgias.  Skin:  Negative for rash.  Neurological:  Positive for headaches. Negative for dizziness, sensory change, loss of consciousness and weakness.  Endo/Heme/Allergies:  Negative for environmental allergies. Does not bruise/bleed easily.  Psychiatric/Behavioral:  Negative for depression and suicidal ideas. The patient is not nervous/anxious and does not have insomnia.       Objective:     BP 128/82 (BP Location: Left Arm, Patient Position: Sitting, Cuff Size: Large)   Pulse 78   Temp 98 F (36.7 C) (Oral)   Resp 18   Ht 5\' 1"  (1.549 m)   Wt 189 lb 3.2 oz (85.8 kg)   SpO2 94%   BMI 35.75 kg/m  BP Readings from Last 3 Encounters:  05/17/23 128/82  01/29/23 (!) 167/70  01/13/23 127/61   Wt Readings from Last 3 Encounters:  05/17/23 189 lb 3.2 oz (85.8 kg)  01/28/23 186 lb 4.8 oz (84.5 kg)  01/13/23 186 lb 4.8 oz (84.5 kg)   SpO2 Readings from Last 3 Encounters:  05/17/23 94%  01/29/23 98%  01/13/23 96%      Physical Exam Vitals and nursing note reviewed.  Constitutional:      General: She is not in acute distress.    Appearance: Normal appearance. She is well-developed.  HENT:     Head: Normocephalic and atraumatic.     Jaw: Tenderness present.      Comments: Vein on R temple tender to touch  Pain in jaw and click on both sides   Eyes:     General: No scleral icterus.       Right eye: No discharge.        Left eye: No discharge.  Cardiovascular:     Rate and Rhythm: Normal rate and regular rhythm.     Heart  sounds: No murmur heard. Pulmonary:     Effort: Pulmonary effort is normal. No respiratory distress.     Breath sounds: Normal breath sounds.  Musculoskeletal:        General: Normal range of motion.     Cervical back: Normal range of motion and neck supple.     Right lower leg: No edema.     Left lower leg: No edema.  Skin:    General: Skin is warm and dry.  Neurological:     Mental Status: She is alert and oriented to person, place, and time.  Psychiatric:        Mood and Affect: Mood normal.        Behavior: Behavior normal.        Thought Content: Thought content normal.        Judgment: Judgment normal.     Results for orders placed or performed in visit on 05/17/23  Sedimentation rate  Result Value Ref  Range   Sed Rate 40 (H) 0 - 30 mm/hr    Last CBC Lab Results  Component Value Date   WBC 7.7 01/13/2023   HGB 11.2 (L) 01/13/2023   HCT 34.2 (L) 01/13/2023   MCV 94.1 01/13/2023   MCH 30.4 01/08/2023   RDW 14.0 01/13/2023   PLT 367.0 01/13/2023   Last metabolic panel Lab Results  Component Value Date   GLUCOSE 109 (H) 01/13/2023   NA 136 01/13/2023   K 3.8 01/13/2023   CL 99 01/13/2023   CO2 25 01/13/2023   BUN 25 (H) 01/13/2023   CREATININE 1.30 (H) 01/13/2023   GFRNONAA 43 (L) 01/13/2023   CALCIUM 9.8 01/13/2023   PHOS 3.9 08/28/2013   PROT 7.0 11/30/2022   ALBUMIN 4.2 11/30/2022   BILITOT 0.4 11/30/2022   ALKPHOS 48 11/30/2022   AST 18 11/30/2022   ALT 11 11/30/2022   ANIONGAP 12 01/13/2023   Last lipids Lab Results  Component Value Date   CHOL 148 11/30/2022   HDL 42.10 11/30/2022   LDLCALC 67 11/30/2022   LDLDIRECT 97.0 06/04/2022   TRIG 195.0 (H) 11/30/2022   CHOLHDL 4 11/30/2022   Last hemoglobin A1c Lab Results  Component Value Date   HGBA1C 5.9 11/30/2022   Last thyroid functions Lab Results  Component Value Date   TSH 1.40 11/30/2022   Last vitamin D No results found for: "25OHVITD2", "25OHVITD3", "VD25OH" Last vitamin B12  and Folate No results found for: "VITAMINB12", "FOLATE"    The ASCVD Risk score (Arnett DK, et al., 2019) failed to calculate for the following reasons:   The patient has a prior MI or stroke diagnosis    Assessment & Plan:   Problem List Items Addressed This Visit       Unprioritized   New daily persistent headache - Primary   Relevant Orders   CBC with Differential/Platelet   Comprehensive metabolic panel   TSH   Sedimentation rate (Completed)   MR Brain Wo Contrast   Other Visit Diagnoses     History of temporal artery biopsy         Assessment and Plan    Forehead Vein Pain   She reports a raised, tender vein on her forehead for about a month, suggesting superficial thrombophlebitis or other vascular issues. An MRI of the head will be ordered to rule out serious conditions, and labs will be checked stat due to the importance of timely diagnosis and potential complications.  Temporal Arteritis   Despite a negative temporal artery biopsy in April, she presents with lethargy, scalp tenderness, and a raised sed rate, raising concerns for temporal arteritis recurrence. Labs will be checked stat, and if they indicate, prednisone will be started. She will be referred to her previous surgeon if necessary, considering her history of swelling and discomfort with prednisone.  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 Symptoms include popping in the left TMJ and jaw soreness. She is advised to see a dentist for TMJ evaluation and to use methocarbamol for relief if available.  Post-Shoulder Replacement Pain   Following a reverse shoulder replacement in April, she reports ongoing pain and is unable to take opioids due to anxiety and adverse effects. The plan includes continuing Tylenol for pain management and considering alternative pain management strategies if pain persists.  Chronic Kidney Disease    Her chronic kidney disease limits pain management options to Tylenol.  She is advised to avoid NSAIDs and other nephrotoxic medications, emphasizing the importance of this restriction.  General Health Maintenance   She discussed the difficulty scheduling with her current primary care provider and is considering switching to Dr. Manson Passey. The importance of regular follow-ups and continuity of care was emphasized.  Follow-up   She will follow up with lab results today, schedule an MRI of the head, and follow up with her surgeon if labs indicate a recurrence of temporal arteritis.        No follow-ups on file.    Donato Schultz, DO

## 2023-05-17 NOTE — Patient Instructions (Signed)
Temporal Arteritis  Temporal arteritis is a condition that makes your arteries become inflamed. It can happen in any part of the body. Most often, it affects your head and face. It is also called giant cell arteritis. This condition can cause severe problems such as blindness. Early treatment can help prevent those problems. What are the causes? The cause of this condition is not known. What increases the risk? You may be more likely to get this condition if: You are older than 74 years of age. You are female. You are Caucasian. You are of [place], [place], [place], [place], or [place] ancestry. You have a family history of the condition. You have polymyalgia rheumatica (PMR). This is a condition that causes muscle pain and stiffness. What are the signs or symptoms? Some people with this condition have just one symptom. Others have more than one symptom. Most symptoms are related to the head and face. These may include: Headache. Hard, swollen, or tender temples. Your temples are the areas on either side of your forehead. Pain when you comb your hair or when you lay your head down. Pain in your jaw when you chew. Pain in your throat or tongue. Problems with your vision. These may include sudden loss of vision in one eye or seeing double. Other symptoms may include: Fever. Feeling tired (fatigue). A dry cough. Pain in your hips and shoulders. Pain in your arms when you exercise. Depression. Weight loss. How is this diagnosed? This condition may be diagnosed based on your symptoms, your medical history, and a physical exam. You may also have tests done. These may include: Blood tests. Biopsy. This is when a small piece of tissue is removed for testing. Imaging tests, such as an ultrasound or MRI. How is this treated? This condition may be treated with medicines to: Reduce inflammation (steroids). Weaken your immune system (immunosuppressants). Your immune system is your body's  defense system. Treat vision problems. Follow these instructions at home: Medicines Take over-the-counter and prescription medicines only as told by your health care provider. Take any vitamins or supplements that your provider recommends. These may include vitamin D and calcium to help stop your bones from getting weak. Eating and drinking  Eat foods that are good for your heart. These may include: Foods high in fiber, such as fresh fruits and vegetables, whole grains, and beans. Foods that have healthy fats in them (omega-3 fats), such as fish, flaxseed, and flaxseed oil. Limit foods that are high in saturated fat and cholesterol. These include processed and fried foods, fatty meat, and full-fat dairy. Limit how much salt (sodium) you eat. Include calcium and vitamin D in your diet. Good sources of these include: Low-fat dairy products, such as milk, yogurt, and cheese. Certain fish, such as fresh or canned salmon, tuna, and sardines. Fortified products. This means that they have calcium and vitamin D added to them. These include fortified cereals and juice. General instructions Exercise. Talk with your provider about what exercises are safe for you. You may be told to do exercises that make your heart beat faster (aerobic exercise). These can help control your blood pressure and prevent bone loss. Stay up-to-date on all of your vaccines as told by your provider. Keep all follow-up visits. The medicines used to treat this condition can make you more likely to have problems such as bone loss or diabetes. Your provider will check for problems during your follow-up visits. Contact a health care provider if: Your symptoms get worse. You have any  signs of infection. These may include fever, swelling, redness, warmth, or tenderness. Get help right away if: You lose your vision. Your pain does not go away, even after you take medicine. You have chest pain. You have trouble breathing. One side  of your face or body gets weak or numb all of a sudden. These symptoms may be an emergency. Get help right away. Call 911. Do not wait to see if the symptoms will go away. Do not drive yourself to the hospital. This information is not intended to replace advice given to you by your health care provider. Make sure you discuss any questions you have with your health care provider. Document Revised: 03/09/2022 Document Reviewed: 03/09/2022 Elsevier Patient Education  2024 [employer].

## 2023-05-24 DIAGNOSIS — M316 Other giant cell arteritis: Secondary | ICD-10-CM | POA: Diagnosis not present

## 2023-05-27 DIAGNOSIS — M316 Other giant cell arteritis: Secondary | ICD-10-CM | POA: Diagnosis not present

## 2023-06-04 DIAGNOSIS — H5203 Hypermetropia, bilateral: Secondary | ICD-10-CM | POA: Diagnosis not present

## 2023-06-06 NOTE — Assessment & Plan Note (Signed)
Hydrate and monitor 

## 2023-06-06 NOTE — Assessment & Plan Note (Signed)
Dulcolax caps 2 daily, Magnesium Citrate 1000 mg daily help move the bowels daily

## 2023-06-06 NOTE — Assessment & Plan Note (Signed)
Encouraged DASH or MIND diet, decrease po intake and increase exercise as tolerated. Needs 7-8 hours of sleep nightly. Avoid trans fats, eat small, frequent meals every 4-5 hours with lean proteins, complex carbs and healthy fats. Minimize simple carbs, high fat foods and processed foods 

## 2023-06-06 NOTE — Assessment & Plan Note (Signed)
For elevated sed rate and new onset daily headache

## 2023-06-06 NOTE — Assessment & Plan Note (Signed)
hgba1c acceptable, minimize simple carbs. Increase exercise as tolerated.  

## 2023-06-06 NOTE — Assessment & Plan Note (Signed)
Monitor and report any concerns, no changes to meds. Encouraged heart healthy diet such as the DASH diet and exercise as tolerated.  ?

## 2023-06-06 NOTE — Assessment & Plan Note (Signed)
Encourage heart healthy diet such as MIND or DASH diet, increase exercise, avoid trans fats, simple carbohydrates and processed foods, consider a krill or fish or flaxseed oil cap daily.  °

## 2023-06-07 ENCOUNTER — Telehealth (INDEPENDENT_AMBULATORY_CARE_PROVIDER_SITE_OTHER): Payer: No Typology Code available for payment source | Admitting: Family Medicine

## 2023-06-07 ENCOUNTER — Other Ambulatory Visit (INDEPENDENT_AMBULATORY_CARE_PROVIDER_SITE_OTHER): Payer: No Typology Code available for payment source

## 2023-06-07 VITALS — BP 166/85 | HR 68

## 2023-06-07 DIAGNOSIS — Z9889 Other specified postprocedural states: Secondary | ICD-10-CM

## 2023-06-07 DIAGNOSIS — N289 Disorder of kidney and ureter, unspecified: Secondary | ICD-10-CM | POA: Diagnosis not present

## 2023-06-07 DIAGNOSIS — E785 Hyperlipidemia, unspecified: Secondary | ICD-10-CM

## 2023-06-07 DIAGNOSIS — E7439 Other disorders of intestinal carbohydrate absorption: Secondary | ICD-10-CM

## 2023-06-07 DIAGNOSIS — I10 Essential (primary) hypertension: Secondary | ICD-10-CM | POA: Diagnosis not present

## 2023-06-07 DIAGNOSIS — R739 Hyperglycemia, unspecified: Secondary | ICD-10-CM | POA: Diagnosis not present

## 2023-06-07 DIAGNOSIS — M316 Other giant cell arteritis: Secondary | ICD-10-CM | POA: Diagnosis not present

## 2023-06-07 DIAGNOSIS — R700 Elevated erythrocyte sedimentation rate: Secondary | ICD-10-CM

## 2023-06-07 DIAGNOSIS — E669 Obesity, unspecified: Secondary | ICD-10-CM

## 2023-06-07 DIAGNOSIS — E739 Lactose intolerance, unspecified: Secondary | ICD-10-CM

## 2023-06-07 DIAGNOSIS — K5900 Constipation, unspecified: Secondary | ICD-10-CM

## 2023-06-07 MED ORDER — METOPROLOL TARTRATE 100 MG PO TABS: 100.0000 mg | ORAL_TABLET | Freq: Two times a day (BID) | ORAL | 1 refills | Status: AC

## 2023-06-07 MED ORDER — LOVASTATIN 40 MG PO TABS: 40.0000 mg | ORAL_TABLET | Freq: Every day | ORAL | 1 refills | Status: AC

## 2023-06-07 MED ORDER — HYDROCHLOROTHIAZIDE 12.5 MG PO CAPS: 25.0000 mg | ORAL_CAPSULE | Freq: Every day | ORAL | 1 refills | Status: AC

## 2023-06-07 NOTE — Progress Notes (Signed)
MyChart Video Visit    Virtual Visit via Video Note   This patient is at least at moderate risk for complications without adequate follow up. This format is felt to be most appropriate for this patient at this time. Physical exam was limited by quality of the video and audio technology used for the visit. [name], CMA was able to get the patient set up on a video visit.  Patient location: home Patient and provider in visit Provider location: Office  I discussed the limitations of evaluation and management by telemedicine and the availability of in person appointments. The patient expressed understanding and agreed to proceed.  Visit Date: 06/07/2023  Today's healthcare provider: Danise Edge, MD     Subjective:    Patient ID: [name], female    DOB: July 22, 1950, 72 y.o.   MRN: [id number]  No chief complaint on file.   HPI Discussed the use of AI scribe software for clinical note transcription with the patient, who gave verbal consent to proceed.  History of Present Illness   The patient, with a history of giant cell arteritis and shoulder arthroscopy, presents for a follow-up consultation. They report having undergone surgery for the arteritis, with a positive diagnosis on the left side and a negative diagnosis on the right. They are currently on a course of steroids, which they are gradually being weaned off. The patient reports feeling fine overall, with occasional headaches managed with Tylenol. They express uncertainty about what will happen once they are completely off the steroids.  The patient also discusses their recovery from shoulder arthroscopy, which was initially challenging due to inability to take pain medications. They report significant improvement, with a return to normal household activities and yard work. They also mention a history of inflammation and a recent period of lethargy and pain, which they attribute to the after-effects of the surgery and the  impact of the steroids. They report that this period was characterized by high blood pressure and a potential increase in blood sugar levels, which they are monitoring closely.        Past Medical History:  Diagnosis Date  . Anemia   . Arthritis   . Benign paroxysmal positional vertigo 11/14/2013  . Colon polyp   . Depression   . Diverticulosis   . Dysrhythmia   . Essential hypertension, benign   . GERD (gastroesophageal reflux disease) 09/02/2013  . GI bleed   . Glaucoma   . Glucose intolerance (impaired glucose tolerance)   . H/O tobacco use, presenting hazards to health 09/02/2013  . History  of basal cell carcinoma   . History of cardiac arrhythmia   . Hyperlipidemia   . Hypertension   . Medicare welcome visit 11/28/2012  . Obesity 02/08/2017  . Palpitations 02/04/2017  . Personal history of colonic polyps 09/02/2013   Patient reports H/O 2 colonoscopies, last one dates back to 2012 in [place] She reports they were labeled as incomplete.  Reports 1st colonoscopy had 1 benign polyp and the 2nd was clear.   . Renal insufficiency 04/28/2021  . Sleep apnea 10/01/2016   Not on CPAP machine   . Stroke (HCC)   . UTI (urinary tract infection)     Past Surgical History:  Procedure Laterality Date  . BICEPT TENODESIS Left 01/28/2023   Procedure: BICEPS TENODESIS;  Surgeon: Cammy Copa, MD;  Location: [facility];  Service: Orthopedics;  Laterality: Left;  . CAROTID STENT    . CERVICAL CONE BIOPSY  1960s  .  COLONOSCOPY  2016  . DILATION AND CURETTAGE OF UTERUS  1966  . JOINT REPLACEMENT  2010 & 2024   L knee and L shoulder  . KNEE SURGERY Left 11/01/2008   left knee total replacement Dr Lajoyce Corners  . REVERSE SHOULDER ARTHROPLASTY Left 01/28/2023   Procedure: LEFT REVERSE SHOULDER ARTHROPLASTY;  Surgeon: Cammy Copa, MD;  Location: [facility];  Service: Orthopedics;  Laterality: Left;  . TUBAL LIGATION  1972  . WISDOM TOOTH EXTRACTION      Family History  Problem Relation  Age of Onset  . Coronary artery disease Paternal Grandmother   . Hyperlipidemia Paternal Grandmother   . Hypertension Paternal Grandmother   . Stroke Paternal Grandmother   . Hypertension Mother   . Lung cancer Mother        smoker  . Uterine cancer Mother   . Birth defects Mother        lung cancer in smoker  . COPD Mother   . Cancer Mother   . Early death Mother   . Obesity Mother   . Hypertension Father   . Kidney disease Father        Renal Cell Carcinoma  . Birth defects Father        renal cell CA, smoker  . Cancer Father   . Hypertension Maternal Grandmother   . Hypertension Maternal Grandfather   . Hypertension Paternal Grandfather   . Dementia Paternal Grandfather   . Hearing loss Paternal Grandfather   . Cancer Brother        testicular  . Heart disease Brother        NI, smoker  . Early death Brother   . Hyperlipidemia Other   . Cancer Paternal Aunt   . COPD Paternal Aunt   . Colon cancer Neg Hx   . Esophageal cancer Neg Hx     Social History   Socioeconomic History  . Marital status: Divorced    Spouse name: Not on file  . Number of children: 0  . Years of education: Not on file  . Highest education level: 12th grade  Occupational History  . Occupation: Retired   Tobacco Use  . Smoking status: Former    Current packs/day: 0.00    Average packs/day: 1 pack/day for 15.0 years (15.0 ttl pk-yrs)    Types: Cigarettes    Start date: 06/09/1967    Quit date: 06/08/1982    Years since quitting: 41.0  . Smokeless tobacco: Never  Vaping Use  . Vaping status: Never Used  Substance and Sexual Activity  . Alcohol use: Yes    Alcohol/week: 4.0 standard drinks of alcohol    Types: 2 Glasses of wine, 2 Standard drinks or equivalent per week    Comment: 1-2 glasses of wine per day  . Drug use: Never  . Sexual activity: Not Currently    Birth control/protection: Post-menopausal    Comment: lives alone, no dietary restrictions except no lactose,  Other Topics  Concern  . Not on file  Social History Narrative   Occupation: unemployed Education administrator- now retired   Divorced   no children    Moved from [place]    Lives alone with 4 cats   Enjoys gardening, planting.             Social Drivers of Corporate investment banker Strain: Low Risk  (05/12/2023)   Overall Financial Resource Strain (CARDIA)   . Difficulty of Paying Living Expenses: Not hard at all  Food Insecurity: No Food Insecurity (05/12/2023)   Hunger Vital Sign   . Worried About Programme researcher, broadcasting/film/video in the Last Year: Never true   . Ran Out of Food in the Last Year: Never true  Transportation Needs: No Transportation Needs (05/12/2023)   PRAPARE - Transportation   . Lack of Transportation (Medical): No   . Lack of Transportation (Non-Medical): No  Physical Activity: Inactive (05/12/2023)   Exercise Vital Sign   . Days of Exercise per Week: 0 days   . Minutes of Exercise per Session: 0 min  Stress: No Stress Concern Present (05/12/2023)   [employer] of Occupational Health - Occupational Stress Questionnaire   . Feeling of Stress : Only a little  Social Connections: Socially Isolated (05/12/2023)   Social Connection and Isolation Panel [NHANES]   . Frequency of Communication with Friends and Family: Three times a week   . Frequency of Social Gatherings with Friends and Family: Twice a week   . Attends Religious Services: Never   . Active Member of Clubs or Organizations: No   . Attends Banker Meetings: Never   . Marital Status: Divorced  Catering manager Violence: Not At Risk (01/28/2023)   Humiliation, Afraid, Rape, and Kick questionnaire   . Fear of Current or Ex-Partner: No   . Emotionally Abused: No   . Physically Abused: No   . Sexually Abused: No    Outpatient Medications Prior to Visit  Medication Sig Dispense Refill  . acetaminophen (TYLENOL) 650 MG CR tablet Take 650-1,300 mg by mouth every 8 (eight) hours as needed for pain.    Marland Kitchen aspirin  EC 81 MG tablet Take 81 mg by mouth at bedtime.    . Cyanocobalamin (VITAMIN B-12 PO) Take 1 tablet by mouth in the morning.    . methocarbamol (ROBAXIN) 500 MG tablet Take 1 tablet (500 mg total) by mouth every 8 (eight) hours as needed for muscle spasms. (Patient not taking: Reported on 06/07/2023) 30 tablet 0  . Multiple Vitamins-Minerals (MULTIVITAMIN WOMEN PO) Take 1 tablet by mouth in the morning.    . Oxymetazoline HCl (NASAL SPRAY) 0.05 % SOLN Place 1 spray into the nose 2 (two) times daily as needed (congestion.).    Marland Kitchen predniSONE (DELTASONE) 20 MG tablet 4 po every day x 30 days 120 tablet 0  . traMADol (ULTRAM) 50 MG tablet Take 1 tablet (50 mg total) by mouth every 6 (six) hours as needed. (Patient not taking: Reported on 06/07/2023) 30 tablet 1  . tretinoin (RETIN-A) 0.1 % cream APPLY TO AFFECTED AREA(S) OF FACE ONE TIME DAILY AT BEDTIME USING A PEA SIZED AMOUNT (Patient taking differently: Apply 1 Application topically in the morning.) 45 g 5  . hydrochlorothiazide (MICROZIDE) 12.5 MG capsule Take 1 capsule (12.5 mg total) by mouth daily. 90 capsule 0  . lovastatin (MEVACOR) 40 MG tablet Take 1 tablet (40 mg total) by mouth at bedtime. 90 tablet 0  . metoprolol tartrate (LOPRESSOR) 100 MG tablet Take 1 tablet (100 mg total) by mouth 2 (two) times daily. 180 tablet 0   No facility-administered medications prior to visit.    No Known Allergies  Review of Systems  Constitutional:  Negative for fever and malaise/fatigue.  HENT:  Negative for congestion.   Eyes:  Negative for blurred vision.  Respiratory:  Negative for shortness of breath.   Cardiovascular:  Negative for chest pain, palpitations and leg swelling.  Gastrointestinal:  Negative for abdominal pain, blood in stool  and nausea.  Genitourinary:  Negative for dysuria and frequency.  Musculoskeletal:  Negative for falls.  Skin:  Negative for rash.  Neurological:  Negative for dizziness, loss of consciousness and headaches.   Endo/Heme/Allergies:  Negative for environmental allergies.  Psychiatric/Behavioral:  Negative for depression. The patient is not nervous/anxious.       Objective:    Physical Exam Constitutional:      General: She is not in acute distress.    Appearance: Normal appearance. She is not ill-appearing or toxic-appearing.  HENT:     Head: Normocephalic and atraumatic.     Right Ear: External ear normal.     Left Ear: External ear normal.     Nose: Nose normal.  Eyes:     General:        Right eye: No discharge.        Left eye: No discharge.  Pulmonary:     Effort: Pulmonary effort is normal.  Skin:    Findings: No rash.  Neurological:     Mental Status: She is alert and oriented to person, place, and time.  Psychiatric:        Behavior: Behavior normal.   BP (!) 166/85   Pulse 68  Wt Readings from Last 3 Encounters:  05/17/23 189 lb 3.2 oz (85.8 kg)  01/28/23 186 lb 4.8 oz (84.5 kg)  01/13/23 186 lb 4.8 oz (84.5 kg)       Assessment & Plan:  Essential hypertension, benign Assessment & Plan: Monitor and report any concerns, no changes to meds. Encouraged heart healthy diet such as the DASH diet and exercise as tolerated.   Orders: -     Comprehensive metabolic panel; Future -     CBC with Differential/Platelet; Future -     TSH; Future -     Metoprolol Tartrate; Take 1 tablet (100 mg total) by mouth 2 (two) times daily.  Dispense: 180 tablet; Refill: 1 -     Comprehensive metabolic panel; Future  Hyperlipidemia, unspecified hyperlipidemia type Assessment & Plan: Encourage heart healthy diet such as MIND or DASH diet, increase exercise, avoid trans fats, simple carbohydrates and processed foods, consider a krill or fish or flaxseed oil cap daily.   Orders: -     Lipid panel; Future  Obesity, unspecified class, unspecified obesity type, unspecified whether serious comorbidity present Assessment & Plan: Encouraged DASH or MIND diet, decrease po intake and increase  exercise as tolerated. Needs 7-8 hours of sleep nightly. Avoid trans fats, eat small, frequent meals every 4-5 hours with lean proteins, complex carbs and healthy fats. Minimize simple carbs, high fat foods and processed foods    Renal insufficiency Assessment & Plan: Hydrate and monitor    GLUCOSE INTOLERANCE Assessment & Plan: hgba1c acceptable, minimize simple carbs. Increase exercise as tolerated.   Orders: -     Hemoglobin A1c; Future  History of temporal artery biopsy Assessment & Plan: For elevated sed rate and new onset daily headache  Orders: -     Sedimentation rate; Future  Constipation, unspecified constipation type Assessment & Plan: Dulcolax caps 2 daily, Magnesium Citrate 1000 mg daily help move the bowels daily   Hyperglycemia  Elevated sed rate -     Sedimentation rate; Future  Other orders -     hydroCHLOROthiazide; Take 2 capsules (25 mg total) by mouth daily.  Dispense: 180 capsule; Refill: 1 -     Lovastatin; Take 1 tablet (40 mg total) by mouth at bedtime.  Dispense: 90  tablet; Refill: 1     Assessment and Plan    Giant Cell Arteritis Positive biopsy on left side, currently weaning off prednisone. Mild headache reported, but no visual changes. -Continue weaning off prednisone as directed by surgeon. -Report any escalating symptoms immediately.  Hyperglycemia Mildly elevated blood sugar likely secondary to steroid use. Hemoglobin A1c pending to assess for potential steroid-induced diabetes. -Check Hemoglobin A1c today. -Consider starting weight loss medication if A1c >6.5.  Hypertension Elevated blood pressure likely secondary to steroid use. -Increase Hydrochlorothiazide to 25mg  daily (two 12.5mg  tablets). -Check blood pressure weekly and report any significant changes. -Schedule nurse visit in one month for blood pressure and pulse check, and to assess tolerance of increased Hydrochlorothiazide dose.  Anemia Mild anemia noted on recent  labs, improved from previous. -Monitor anemia with regular blood work.  Shoulder Arthroplasty Recovery from recent surgery, pain improved. -Continue current pain management and physical therapy as needed.  General Health Maintenance -Encouraged regular protein intake and hydration. -Aim for 4000-8000 steps daily for overall health. -Refill Lovastatin, Metoprolol, and Hydrochlorothiazide for 90 days. -Check labs today including Sed rate and complete metabolic panel. -Consider adding Sed rate to blood work in one month if still elevated.         I discussed the assessment and treatment plan with the patient. The patient was provided an opportunity to ask questions and all were answered. The patient agreed with the plan and demonstrated an understanding of the instructions.   The patient was advised to call back or seek an in-person evaluation if the symptoms worsen or if the condition fails to improve as anticipated.  Danise Edge, MD [facility] at [facility] [phone] (phone) [phone] (fax)  [facility]

## 2023-06-08 LAB — LIPID PANEL
Cholesterol: 173 mg/dL (ref 0–200)
HDL: 75.7 mg/dL (ref >39.00)
LDL Cholesterol: 66 mg/dL (ref 0–99)
NonHDL: 97.27
Total CHOL/HDL Ratio: 2
Triglycerides: 158 mg/dL — ABNORMAL HIGH (ref 0.0–149.0)
VLDL: 31.6 mg/dL (ref 0.0–40.0)

## 2023-06-08 LAB — COMPREHENSIVE METABOLIC PANEL
ALT: 17 U/L (ref 0–35)
AST: 15 U/L (ref 0–37)
Albumin: 4.3 g/dL (ref 3.5–5.2)
Alkaline Phosphatase: 59 U/L (ref 39–117)
BUN: 40 mg/dL — ABNORMAL HIGH (ref 6–23)
CO2: 31 meq/L (ref 19–32)
Calcium: 9.9 mg/dL (ref 8.4–10.5)
Chloride: 99 meq/L (ref 96–112)
Creatinine, Ser: 1.73 mg/dL — ABNORMAL HIGH (ref 0.40–1.20)
GFR: 28.66 mL/min — ABNORMAL LOW (ref >60.00)
Glucose, Bld: 82 mg/dL (ref 70–99)
Potassium: 3.7 meq/L (ref 3.5–5.1)
Sodium: 139 meq/L (ref 135–145)
Total Bilirubin: 0.5 mg/dL (ref 0.2–1.2)
Total Protein: 6.8 g/dL (ref 6.0–8.3)

## 2023-06-08 LAB — CBC WITH DIFFERENTIAL/PLATELET
Basophils Absolute: 0.1 10*3/uL (ref 0.0–0.1)
Basophils Relative: 0.5 % (ref 0.0–3.0)
Eosinophils Absolute: 0 10*3/uL (ref 0.0–0.7)
Eosinophils Relative: 0.4 % (ref 0.0–5.0)
HCT: 41 % (ref 36.0–46.0)
Hemoglobin: 13.3 g/dL (ref 12.0–15.0)
Lymphocytes Relative: 35.3 % (ref 12.0–46.0)
Lymphs Abs: 4.7 10*3/uL — ABNORMAL HIGH (ref 0.7–4.0)
MCHC: 32.4 g/dL (ref 30.0–36.0)
MCV: 92.9 fL (ref 78.0–100.0)
Monocytes Absolute: 1.2 10*3/uL — ABNORMAL HIGH (ref 0.1–1.0)
Monocytes Relative: 8.8 % (ref 3.0–12.0)
Neutro Abs: 7.3 10*3/uL (ref 1.4–7.7)
Neutrophils Relative %: 55 % (ref 43.0–77.0)
Platelets: 278 10*3/uL (ref 150.0–400.0)
RBC: 4.41 Mil/uL (ref 3.87–5.11)
RDW: 15.6 % — ABNORMAL HIGH (ref 11.5–15.5)
WBC: 13.2 10*3/uL — ABNORMAL HIGH (ref 4.0–10.5)

## 2023-06-08 LAB — SEDIMENTATION RATE: Sed Rate: 11 mm/h (ref 0–30)

## 2023-06-08 LAB — HEMOGLOBIN A1C: Hgb A1c MFr Bld: 6.2 % (ref 4.6–6.5)

## 2023-06-08 LAB — TSH: TSH: 1.65 u[IU]/mL (ref 0.35–5.50)

## 2023-06-11 ENCOUNTER — Other Ambulatory Visit: Payer: Self-pay | Admitting: Emergency Medicine

## 2023-06-11 DIAGNOSIS — I10 Essential (primary) hypertension: Secondary | ICD-10-CM

## 2023-06-17 ENCOUNTER — Encounter: Payer: Self-pay | Admitting: Family Medicine

## 2023-06-18 ENCOUNTER — Other Ambulatory Visit (INDEPENDENT_AMBULATORY_CARE_PROVIDER_SITE_OTHER): Payer: No Typology Code available for payment source

## 2023-06-18 DIAGNOSIS — I10 Essential (primary) hypertension: Secondary | ICD-10-CM

## 2023-06-18 LAB — COMPREHENSIVE METABOLIC PANEL
ALT: 18 U/L (ref 0–35)
AST: 15 U/L (ref 0–37)
Albumin: 4 g/dL (ref 3.5–5.2)
Alkaline Phosphatase: 50 U/L (ref 39–117)
BUN: 43 mg/dL — ABNORMAL HIGH (ref 6–23)
CO2: 31 meq/L (ref 19–32)
Calcium: 9 mg/dL (ref 8.4–10.5)
Chloride: 101 meq/L (ref 96–112)
Creatinine, Ser: 1.63 mg/dL — ABNORMAL HIGH (ref 0.40–1.20)
GFR: 30.77 mL/min — ABNORMAL LOW (ref >60.00)
Glucose, Bld: 87 mg/dL (ref 70–99)
Potassium: 3.6 meq/L (ref 3.5–5.1)
Sodium: 140 meq/L (ref 135–145)
Total Bilirubin: 0.4 mg/dL (ref 0.2–1.2)
Total Protein: 6 g/dL (ref 6.0–8.3)

## 2023-06-18 LAB — CBC WITH DIFFERENTIAL/PLATELET
Basophils Absolute: 0 10*3/uL (ref 0.0–0.1)
Basophils Relative: 0.4 % (ref 0.0–3.0)
Eosinophils Absolute: 0.1 10*3/uL (ref 0.0–0.7)
Eosinophils Relative: 0.5 % (ref 0.0–5.0)
HCT: 38.1 % (ref 36.0–46.0)
Hemoglobin: 12.3 g/dL (ref 12.0–15.0)
Lymphocytes Relative: 39.2 % (ref 12.0–46.0)
Lymphs Abs: 4.3 10*3/uL — ABNORMAL HIGH (ref 0.7–4.0)
MCHC: 32.4 g/dL (ref 30.0–36.0)
MCV: 93.8 fL (ref 78.0–100.0)
Monocytes Absolute: 0.7 10*3/uL (ref 0.1–1.0)
Monocytes Relative: 6.4 % (ref 3.0–12.0)
Neutro Abs: 5.9 10*3/uL (ref 1.4–7.7)
Neutrophils Relative %: 53.5 % (ref 43.0–77.0)
Platelets: 205 10*3/uL (ref 150.0–400.0)
RBC: 4.06 Mil/uL (ref 3.87–5.11)
RDW: 16.2 % — ABNORMAL HIGH (ref 11.5–15.5)
WBC: 11 10*3/uL — ABNORMAL HIGH (ref 4.0–10.5)

## 2023-06-21 DIAGNOSIS — N183 Chronic kidney disease, stage 3 unspecified: Secondary | ICD-10-CM | POA: Diagnosis not present

## 2023-06-21 DIAGNOSIS — I129 Hypertensive chronic kidney disease with stage 1 through stage 4 chronic kidney disease, or unspecified chronic kidney disease: Secondary | ICD-10-CM | POA: Diagnosis not present

## 2023-06-21 NOTE — Telephone Encounter (Signed)
 Called patient. Message given per provider's note. Pt expressed understanding.

## 2023-07-08 ENCOUNTER — Ambulatory Visit: Payer: No Typology Code available for payment source

## 2023-07-08 VITALS — BP 160/88

## 2023-07-08 DIAGNOSIS — I10 Essential (primary) hypertension: Secondary | ICD-10-CM

## 2023-07-08 MED ORDER — VALSARTAN 40 MG PO TABS: 40.0000 mg | ORAL_TABLET | Freq: Every day | ORAL | 1 refills | Status: AC

## 2023-07-08 NOTE — Progress Notes (Signed)
BP Readings from Last 3 Encounters:  06/07/23 (!) 166/85  05/17/23 128/82  01/29/23 (!) 167/70   Patient  was on hydrochlorothiazide 12.5 once a day and this was increased to hydrochlorothiazide 12.5 mg twice a day and continued Metoprolol tartrate 100 mg daily Her BP today is 160/88 on left arm and 158/90 on right arm with a pulse of 85.   Per Dr. Abner Greenspan patient was advised to start Valsartan 40 mg daily and come back in 3 weeks for CMP and BP Check with [place].  Unfortunately, patient is having a change of insurance and will have to start seeing a provider at [facility]. She reports she already has appointment to see a provider in February. She was encourage to pick up our notes so that they can be aware of recent medication change and follow up on this accordingly.

## 2023-07-08 NOTE — Addendum Note (Signed)
Addended by: Wilford Corner on: 07/08/2023 12:16 PM   Modules accepted: Orders

## 2023-07-17 IMAGING — US US RENAL
1 series · 14 of 25 positions shown · non-contrast
Comparison: CT abdomen and pelvis 10/06/2019. renal ultrasound
02/02/2021.

CLINICAL DATA: Acute renal dysfunction.

EXAM:
RENAL / URINARY TRACT ULTRASOUND COMPLETE

[Series 1: us renal · 14 of 35 slices shown]
[im 1/35]
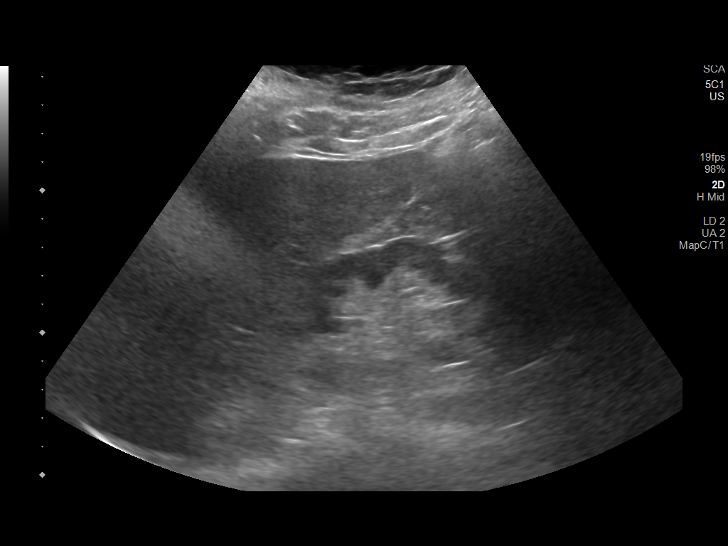
[im 3/35]
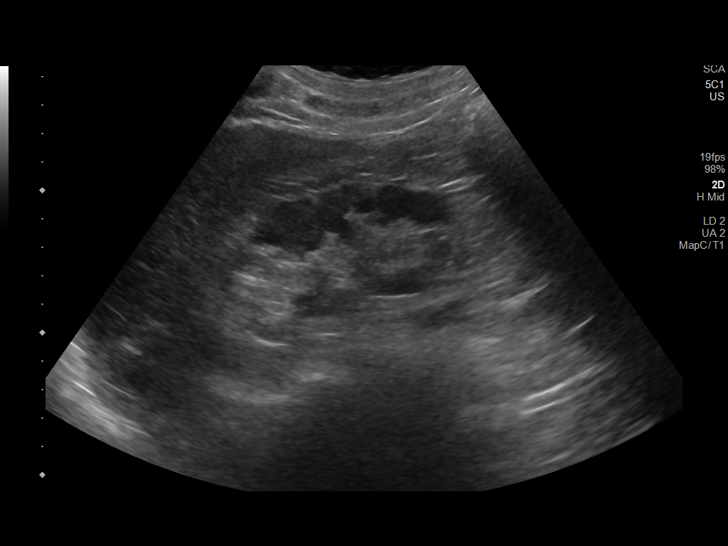
[im 6/35]
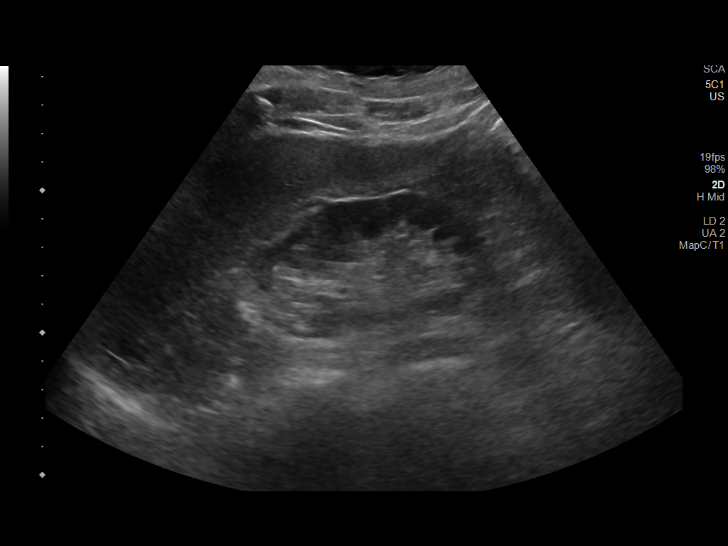
[im 9/35]
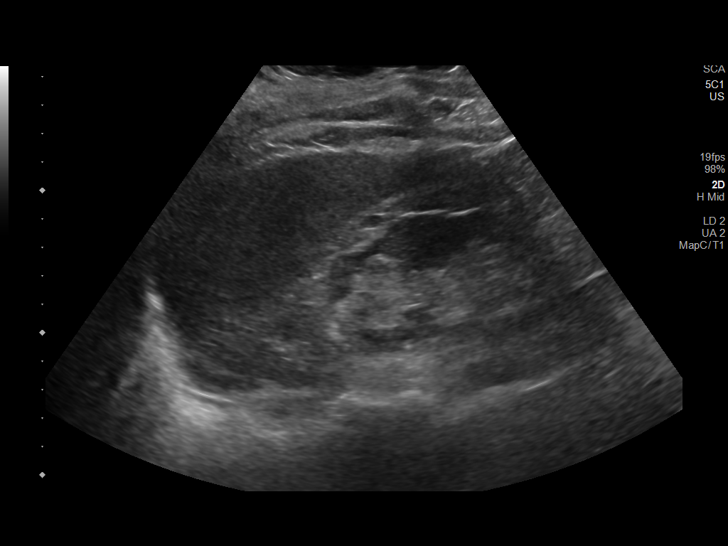
[im 12/35]
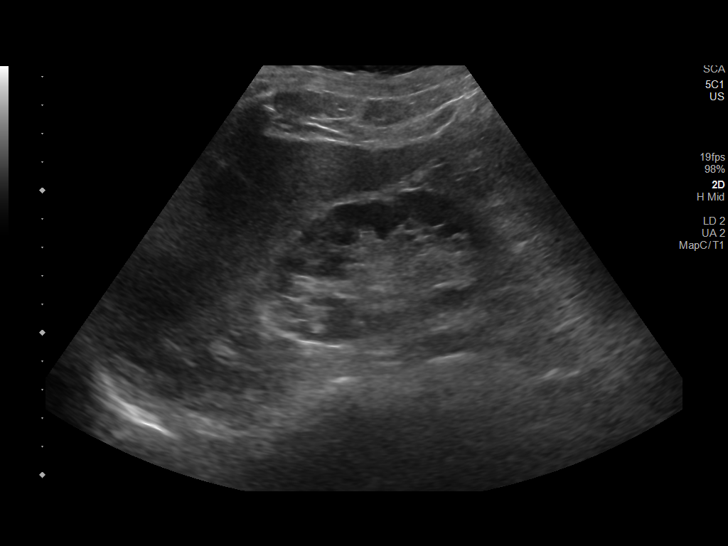
[im 13/35]
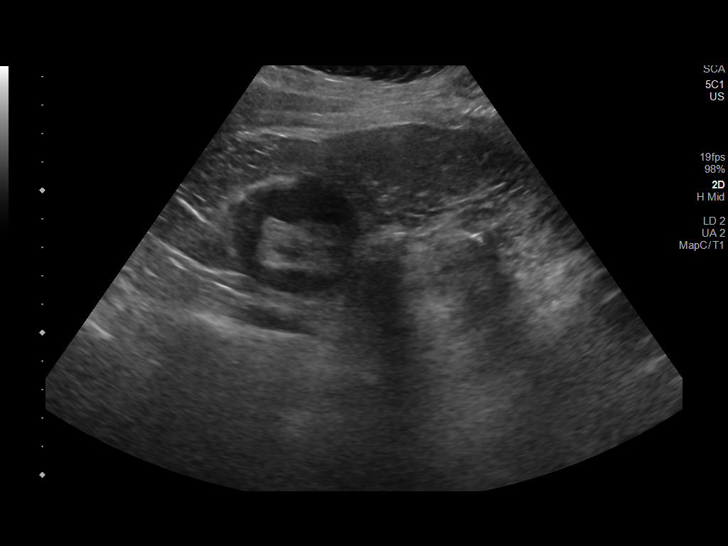
[im 16/35]
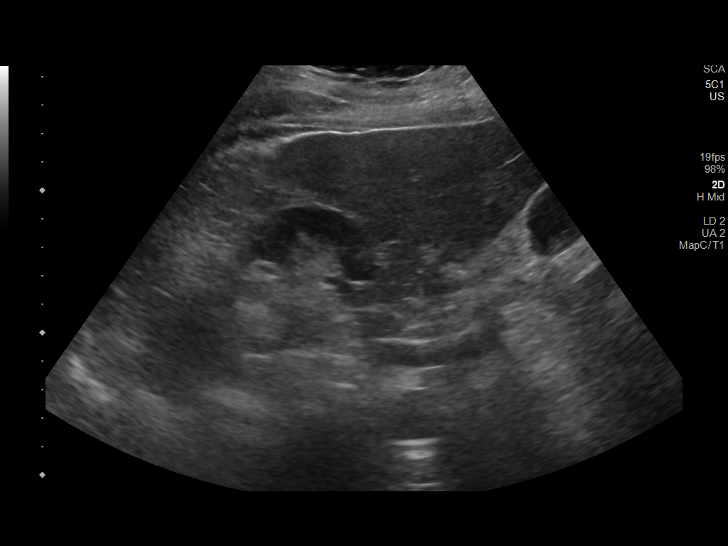
[im 19/35]
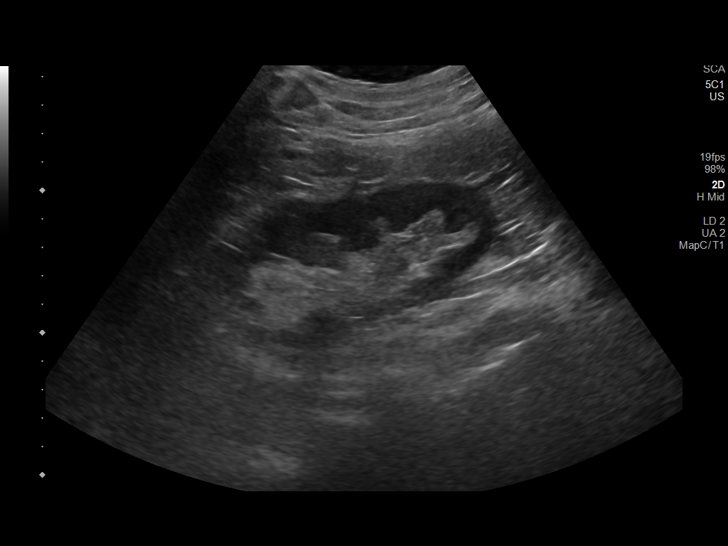
[im 22/35]
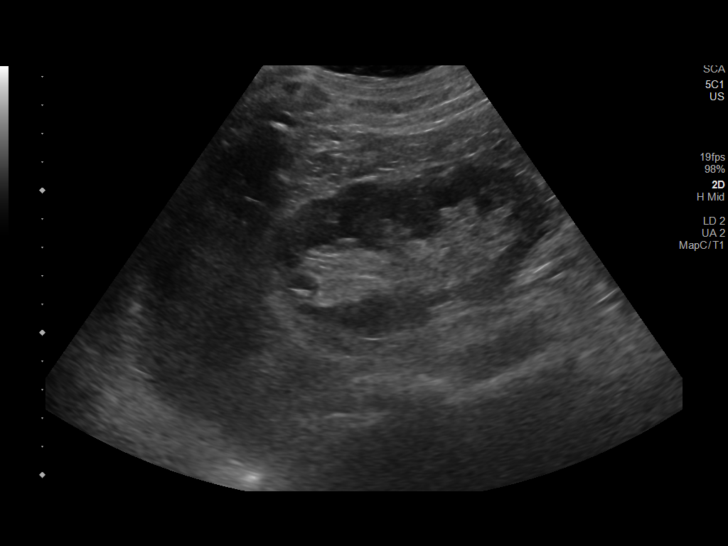
[im 23/35]
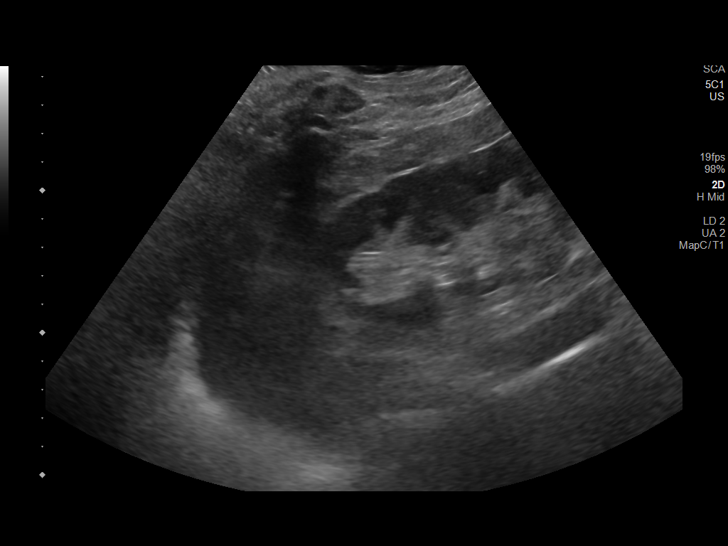
[im 26/35]
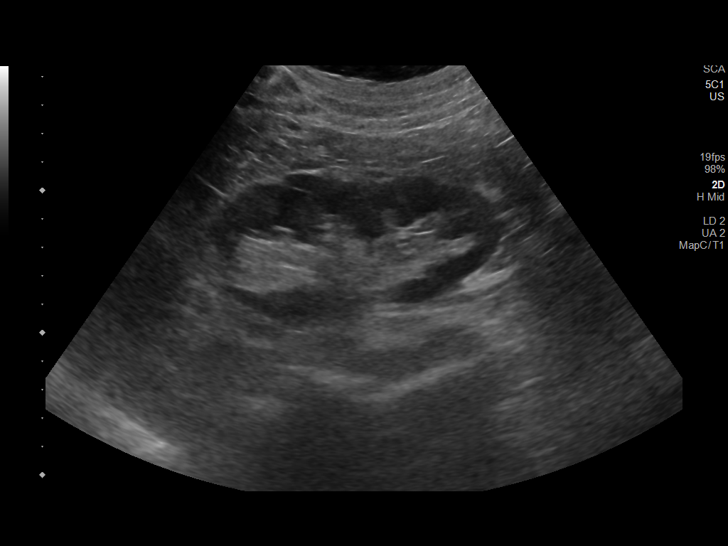
[im 29/35]
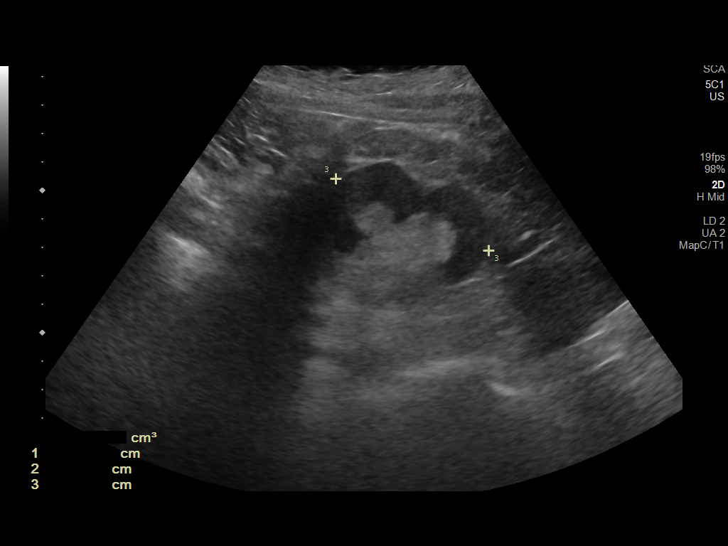
[im 32/35]
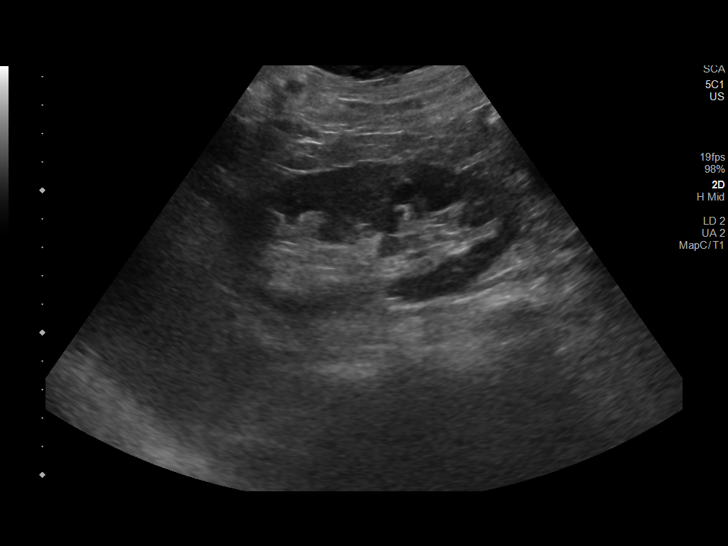
[im 35/35]
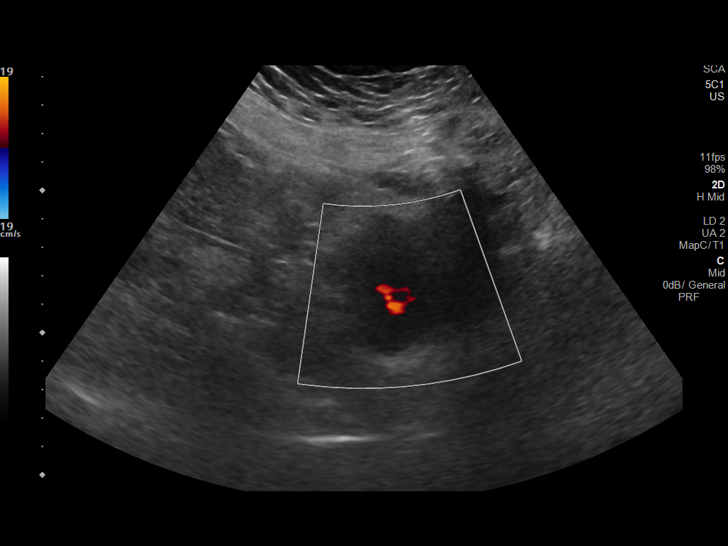

[14 of 25 positions shown; findings below may reference images not displayed]

FINDINGS: Right Kidney:

Renal measurements: 8.7 x 4.4 x 5.0 cm = volume: 100 mL.
Echogenicity within normal limits. No mass or hydronephrosis
visualized.

Left Kidney:

Renal measurements: 10.2 x 4.8 x 5.9 cm = volume: 152 mL.
Echogenicity within normal limits. No mass or hydronephrosis
visualized.

Bladder:

Appears normal for degree of bladder distention.

Other:

None.
IMPRESSION: Negative.

## 2023-12-02 ENCOUNTER — Encounter: Payer: No Typology Code available for payment source | Admitting: Family Medicine

## 2024-04-10 ENCOUNTER — Encounter: Payer: Self-pay | Admitting: Radiology
